# Patient Record
Sex: Female | Born: 1935
Health system: Western US, Academic
[De-identification: ages and names within clinical notes are randomized; demographics above are authoritative.]

---

## 2018-01-12 DIAGNOSIS — E876 Hypokalemia: Secondary | ICD-10-CM

## 2018-01-12 DIAGNOSIS — I129 Hypertensive chronic kidney disease with stage 1 through stage 4 chronic kidney disease, or unspecified chronic kidney disease: Secondary | ICD-10-CM

## 2018-12-15 ENCOUNTER — Telehealth: Payer: PRIVATE HEALTH INSURANCE

## 2018-12-15 NOTE — Telephone Encounter
Appointment Accommodation Request    MD Name: Hart Carwin    Appointment Type: New     Reason for sooner request: Pt usually has monthly follow ups and would like to establish care with Dr. Hart Carwin.     Pt was referred by Dr. Evelena Peat     Date/Time Requested (If any): As soon as possible.     Last seen by MD: New Pt.     Any Symptoms:  []  Yes  [x]  No      ? If yes, what symptoms are you experiencing:   o Duration of symptoms (how long):     Patient was offered an appointment but declined.    Patient was advised to seek emergency services if conditions are urgent or emergent.    Patient has been notified of the 24-48 hour turnaround time.

## 2018-12-28 ENCOUNTER — Ambulatory Visit: Payer: PRIVATE HEALTH INSURANCE

## 2018-12-28 DIAGNOSIS — D631 Anemia in chronic kidney disease: Secondary | ICD-10-CM

## 2018-12-28 DIAGNOSIS — I129 Hypertensive chronic kidney disease with stage 1 through stage 4 chronic kidney disease, or unspecified chronic kidney disease: Secondary | ICD-10-CM

## 2018-12-28 DIAGNOSIS — N184 Chronic kidney disease, stage 4 (severe): Secondary | ICD-10-CM

## 2018-12-28 DIAGNOSIS — Z862 Personal history of diseases of the blood and blood-forming organs and certain disorders involving the immune mechanism: Secondary | ICD-10-CM

## 2018-12-28 DIAGNOSIS — N189 Chronic kidney disease, unspecified: Secondary | ICD-10-CM

## 2018-12-28 MED ORDER — DARBEPOETIN ALFA 100 MCG/ML IJ SOLN
100 ug | SUBCUTANEOUS | 4 refills | 30.00000 days | Status: AC
Start: 2018-12-28 — End: ?

## 2018-12-28 MED ORDER — DARBEPOETIN ALFA 100 MCG/ML IJ SOLN
100 ug | SUBCUTANEOUS | 4 refills | 30.00000 days | Status: AC
Start: 2018-12-28 — End: 2018-12-29

## 2018-12-28 NOTE — Progress Notes
OUTPATIENT RENAL CONSULTATION    Referring Physician:  Zackrey Dyar,*     Reason for Consultation:  Management of CKD    History of Present Illness:  84 y.o. African American female with CKD, HTN, MDS and hypothyroidism. She is here to establish care with nephrology, accompanied by her daughter.    She lives in [place] but has just moved to [facility] to stay with her daughter for the next 6 months. She follows with her nephrologist,  hematologist and her PCP in [place].  She has history of long-standing hypertension.   Kidney disease was diagnosed about 4 years ago and was told it ''rapidly progressed''.  Per patient, about 4 years ago she underwent an invasive vascular study, which sounds like renal artery study and was told there was only some calcification and that the kidneys were not the cause of high blood pressure.  She also has history of MDS - diagnosed by bone marrow biopsy. She receives monthly Aranesp in clinic but she does not know the dose.   Renal US in June 2020 from outside record reviewed. Noted small stones <39mm in the right kidneys w/o hydronephrosis. Patient denies passing any stone or history of urologic intervention. No symptoms. She had one episode of gross hematuria in Sep 2020 and was told there was no infection. It resolved spontaneouslu. Did not have cold symptoms at that time. No pain. No dysuria. No feeling of incomplete bladder emptying. No foamy urine.  No diabetes.    Home BP: average 130-140s/60s. No orthostatic symptoms.  On amlodipine 5 mg daily, clonidine 0.1 mg TID and Toprol XL 100 mg daily  Off lisinopril due to cough    Last Aresnesp dose on 11/17/18. Hb 10.3 in 11/12/18 per outside record.    Fair appetite. Feels fine. No N/V. No chest pain or dyspnea. No leg swelling.    No history of CAD.     Family history:  Sisters have kidney disease not on dialysis.    Review of Systems: Besides what was mentioned above a 14-pt review of systems was obtained and found to be negative or normal.    No past medical history on file.      No past surgical history on file.    Allergies   Allergen Reactions   ? Penicillins Hives   ? Sulfa Antibiotics Agranulocytosis   ? Naproxen Rash     Aleve       Medications that the patient states to be currently taking   Medication Sig   ? amLODIPine 5 mg tablet    ? aspirin 81 mg EC tablet Take 81 mg by mouth daily.   ? cloNIDine 0.1 mg tablet Take 0.1 mg by mouth.   ? esomeprazole 40 mg capsule    ? gabapentin 100 mg capsule Take 100 mg by mouth.   ? metoprolol succinate 100 mg 24 hr tablet    ? mirtazapine 15 mg tablet TAKE ONE TABLET BY MOUTH EVERY NIGHT   ? POTASSIUM CHLORIDE PO    ? rosuvastatin 10 mg tablet Take 10 mg by mouth.   ? SYNTHROID 50 mcg tablet    ? SYNTHROID 75 mcg tablet        Social History     Socioeconomic History   ? Marital status: Unknown     Spouse name: Not on file   ? Number of children: Not on file   ? Years of education: Not on file   ? Highest education level: Not  on file   Occupational History   ? Not on file   Social Needs   ? Financial resource strain: Not on file   ? Food insecurity     Worry: Not on file     Inability: Not on file   ? Transportation needs     Medical: Not on file     Non-medical: Not on file   Tobacco Use   ? Smoking status: Former Smoker   ? Smokeless tobacco: Never Used   Substance and Sexual Activity   ? Alcohol use: Not Currently   ? Drug use: Not on file   ? Sexual activity: Not on file   Lifestyle   ? Physical activity     Days per week: Not on file     Minutes per session: Not on file   ? Stress: Not on file   Relationships   ? Social Wellsite geologist on phone: Not on file     Gets together: Not on file     Attends religious service: Not on file     Active member of club or organization: Not on file     Attends meetings of clubs or organizations: Not on file     Relationship status: Not on file Other Topics Concern   ? Not on file   Social History Narrative   ? Not on file       No family history on file.    Physical Examination:  BP 165/60 (BP Location: Right arm, Patient Position: Standing, Cuff Size: Regular)  ~ Pulse 84  ~ Temp 35.8 ?C (96.4 ?F) (Forehead)  ~ Resp 16  ~ Ht 1.549 m (5' 1'')  ~ Wt 52.3 kg (115 lb 6.4 oz)  ~ SpO2 93% Comment: RA- Finger Probe ~ BMI 21.80 kg/m?   General: NAD   Head: Atraumatic, normocephalic   Eyes: EOMI, no conjunctival pallor, no scleral icterus   ENMT: clear oropharynx, moist oral mucosa, fair dentition   Neck: supple, no JVD, midline trachea   Cardiovascular: RRR, normal S1/S2, SEM grade 4 at LUSB  Respiratory: clear to auscultation, good air entry, no accessory muscle use   Abdomen: +BS soft, nontender, nondistended  Extremities: trace lower extremity swelling. Warm.   Skin: No rashes. Nl turgor    Labs:    No results found for: WBC, HGB, HCT, MCV, PLT    No results found for: FE, TIBC, FEBINDSAT, FERRITIN    No results found for: NA, K, CL, CO2, BUN, CREAT, GLUCOSE    No results found for: CREAT    No results found for: CALCIUM, ICALCOR, PHOS, MG, ALBUMIN    No results found for: VITD25OH, PTHINT    No results found for: CHOL, CHOLHDL, TRIGLY, NOHDLCHOCAL    No results found for: PHVEN, PCO2VEN, PO2VEN, BICARBVEN, BEVEN    No results found for: PHART, PCO2ART, PO2ART, BICARBART, BEART, O2SATART, FIO2ART    No results found for: SPECGRAVUR, PHUR, BLDUR, BILIUR, KETONESUR, GLUCOSEUR, PROTCLUR, NITRITEUR, LEUKESTUR, RBCSUR, WBCSUR    No results found for: TPCREAT    No results found for: Gunnar Fusi, CLRANUR, CREATRANUR      Outside record lab:  11/12/18   CBC WBC 45.5, Hb 10.3, Plt 177  TG 157, TC 112, HDL 26, cLDL 55  BUN 45, Cr 1.8. eGFR 33  Ca 9.7, Phos 4.8, Alb 4.3  Na 142, K 3.5, Cl 104, tCO2 25    Cr 2.1 on 09/02/18  2  on 07/01/18      Imaging:  US kidneys 07/2018 from outside record Rt kidney 9.1 cm - multiple small non-obstructing stones visualized, largest 0.42 cm  Lt kidney 8.3 cm    Impression:  84 y.o.female with long-standing HTN, MDS, hypothyroidism here to establish care    Assessment:  #CKD: Stage G3b/4/A?  - Secondary to hypertensive nephrosclerosis +/- secondary FSGS  - Baseline creatinine 1.8-2  - Small kidneys on renal US. Per patient, RAS was ruled out    # Proteinuria  - Proteinuria: Goal UPC < [id number] mg/day  - Repeat UA and UPCR, UACR  - Not on ACEi/ARB (was on lisinopril but off due to cough)    #Hypertension- at goal BP < 130/80.  - amlodipine 5 mg daily  - clonidine 0.1 mg TID  - Toprol XL 100 mg daily  - low salt diet    #Volume Status - euvoemic    #CKD MBD  - check Ca, Phos, PTH, VitD  -Vitamin D: Goal >30    #Acid-Base -  Goal is to maintain total CO2 (HCO3-) ? 22 assuming no respiratory alkalosis   - at goal. Not on alkaline therapy    #Anemia -  - repeat CBC and iron profile  - patient is already on Aranesp for both anemia due to CKD and MDS - previously prescribed by her hematologist in [place]  - prescribe Aranesp 100 mcg subcutaneous monthly for now. Asked patient to check the dose with her hematologist    #Hyperlipidemia - CKD is an independent risk factor for CHD.    - on rosuvastatin 10 mg daily    #Immunizations -    Influenza: Oct 2020   Pneumovax: ?   Prevnar 13: ?   Hepatitis B: not sure    There is no immunization history on file for this patient.     # Transplant status - unlikely to be a candidate given advanced age    Plan:  - repeat complete CKD lab   - Aranesp 100 mcg subcutaneous monthly     General CKD Recommendations:  -Recommend 0.8g/kg of protein intake for estimated gfr <60. If on dialysis, recommend  1.2 gm/kg/day in CKD patients on dialysis.  -Avoid nephrotoxins including NSAIDs and chinese herbs  -renally dose all medications to estimated GFR  -If estimated GFR <30, avoid gadolinium exposure with MRI. -avoid iodinated contrast, if possible.  If contrast is needed, please contact us for recommendations.    Return to clinic: 2 mo with labs prior    Kupono Marling  Nephrology Fellow

## 2018-12-29 ENCOUNTER — Telehealth: Payer: MEDICARE

## 2018-12-29 NOTE — Telephone Encounter
PLEASE SUBMIT [place]

## 2018-12-29 NOTE — Telephone Encounter
Call Back Request    MD:  Dr. Hewitt Shorts    Reason for call back: Pt's daughter called and advised that she would like a call back about medication. She stated that she tried to get Aranesp through [facility] but we don't carry it and had it sent to an outside pharmacy. Pt stated that she spoke with pharmacy and they advised that they would submit a PA for the medication. Pt wants to follow up and see if the PA was received or how to obtain the medication. cBN [phone]    Any Symptoms:  []  Yes  [x]  No      ? If yes, what symptoms are you experiencing:    o Duration of symptoms (how long):    o Have you taken medication for symptoms (OTC or Rx):      Patient or caller has been notified of the 24-48 hour turnaround time.

## 2018-12-30 NOTE — Telephone Encounter
WBC = 61.6    Lab will fax results

## 2018-12-30 NOTE — Telephone Encounter
PDL Call to Practice    Reason for Call: Karlene Lineman from [facility] requesting to speak with office regarding Critical Lab results.     MD: [email or web address]    Appointment Related?  []  Yes  [x]  No     If yes;  Date:  Time:    Call warm transferred to PDL: [x]  Yes  []  No    Call Received by Practice Representative: Rica Mote

## 2018-12-30 NOTE — Telephone Encounter
Reply by:    Ok. Known CML/MDS. Stable - at baseline.  Thanks

## 2019-01-05 ENCOUNTER — Telehealth: Payer: MEDICARE

## 2019-01-05 ENCOUNTER — Ambulatory Visit: Payer: MEDICARE

## 2019-01-05 NOTE — Telephone Encounter
Thats ok. She can come in for EPO every 2 weeks until Aranesp is approved.

## 2019-01-05 NOTE — Progress Notes
Renal Attending    I saw and evaluated the patient and I discussed the management with the housestaff. I then reviewed the housestaff's notes and I agreed with findings and plan of care that we formulated.

## 2019-01-05 NOTE — Telephone Encounter
Aranesp pending approval.    Please advise on request below for in office EPO inj.

## 2019-01-05 NOTE — Addendum Note
Addended by: Greggory Keen on: 01/05/2019 03:23 PM     Modules accepted: Orders

## 2019-01-05 NOTE — Telephone Encounter
Aranesp (Albumin Free) 100MCG/ML solution APPROVED   PA Case ID: [id number]  VALID: 01/05/2019 - 03/30/2019

## 2019-01-05 NOTE — Telephone Encounter
Call Back Request    MD:  Emilia Beck     Reason for call back: Pt's daughter called with regards to pt's injection for Aranesp. She advised that pt has not been able to receive the injection due to the PA still pending. She is requesting for pt to be able to come in for an injection of Epogen to prevent from having her white blood cell count get too low. She is requesting for this to be an appointment with a Nurse.   Please assist.    CB:[phone]    Any Symptoms:  []  Yes  [x]  No      ? If yes, what symptoms are you experiencing:    o Duration of symptoms (how long):    o Have you taken medication for symptoms (OTC or Rx):      Patient or caller has been notified of the 24-48 hour turnaround time.

## 2019-01-06 NOTE — Progress Notes
Renal Attending:    The fellow will follow-up with any additional orders as needed by the patient.

## 2019-01-06 NOTE — Telephone Encounter
Renal Attending:    As stated by the fellow, she can come in for EPO every 2 weeks until Aranesp is approved.

## 2019-01-07 ENCOUNTER — Institutional Professional Consult (permissible substitution): Payer: MEDICARE

## 2019-01-07 DIAGNOSIS — Z862 Personal history of diseases of the blood and blood-forming organs and certain disorders involving the immune mechanism: Secondary | ICD-10-CM

## 2019-01-07 DIAGNOSIS — N189 Chronic kidney disease, unspecified: Secondary | ICD-10-CM

## 2019-01-14 ENCOUNTER — Ambulatory Visit: Payer: PRIVATE HEALTH INSURANCE

## 2019-01-14 ENCOUNTER — Telehealth: Payer: PRIVATE HEALTH INSURANCE

## 2019-01-14 ENCOUNTER — Ambulatory Visit: Payer: PRIVATE HEALTH INSURANCE | Attending: Emergency Medical Services

## 2019-01-14 DIAGNOSIS — I10 Essential (primary) hypertension: Secondary | ICD-10-CM

## 2019-01-14 DIAGNOSIS — R0600 Dyspnea, unspecified: Secondary | ICD-10-CM

## 2019-01-14 DIAGNOSIS — R0902 Hypoxemia: Secondary | ICD-10-CM

## 2019-01-14 DIAGNOSIS — N189 Chronic kidney disease, unspecified: Secondary | ICD-10-CM

## 2019-01-14 DIAGNOSIS — R05 Cough: Secondary | ICD-10-CM

## 2019-01-14 DIAGNOSIS — N1832 Stage 3b chronic kidney disease: Secondary | ICD-10-CM

## 2019-01-14 DIAGNOSIS — C9210 Chronic myeloid leukemia, BCR/ABL-positive, not having achieved remission: Secondary | ICD-10-CM

## 2019-01-14 DIAGNOSIS — D469 Myelodysplastic syndrome, unspecified: Secondary | ICD-10-CM

## 2019-01-14 DIAGNOSIS — J40 Bronchitis, not specified as acute or chronic: Secondary | ICD-10-CM

## 2019-01-14 NOTE — Telephone Encounter
Call Back Request    MD:  Dr. Hewitt Shorts    Reason for call back:  The patient's daughter called to get advice regarding her mother's current condition.  She stated that her mother has been coughing last night and was unable to get rest as a result.  She feels it's getting worse and causing other issues.     The daughter scheduled an appointment for 12/14, Monday however she feels it needs to be addressed sooner.    She unsure if they need to go to urgent care.  She was advised that seek urgent care or ED as well.    Please call back to assist.  Thank you.    Any Symptoms:  []  Yes  [x]  No      ? If yes, what symptoms are you experiencing:    o Duration of symptoms (how long):    o Have you taken medication for symptoms (OTC or Rx):      Patient or caller has been notified of the 24-48 hour turnaround time.

## 2019-01-14 NOTE — Progress Notes
The patient's daughter reports that patient has been coughing more and gets more short of breath when she walks for the past 5 days.  Oropharyngeal SARS-CoV2 was negative on 12/4 per her report.  Advised to go to urgent care or emergency room since she has chronic leukemia and she is at higher risk for infection.

## 2019-01-14 NOTE — Progress Notes
Chief complaint:  I've had this bad cough and trouble breathing  Lindsay Brennan is a 84 y.o. female  History CKD, HTN, myelodysplastic syndrome presents with two weeks of a dry cough that has been worsening over the past several days.  Last night was severe, she was unable to sleep.  She relates feeling SOB, particularly with exertion which is something she has never experienced.  Patient had negative Covid test 6 days ago, was a buccal swab.  She has had no chest pain, fevers/chills, myalgias, headache, rash, vomiting, diarrhea, anosmia or loss of taste.  She has had no known contact with Covid positive person.     No past medical history on file.  As above  Hypothyroidism  Current Outpatient Medications   Medication Sig   ? amLODIPine 5 mg tablet    ? aspirin 81 mg EC tablet Take 81 mg by mouth daily.   ? cloNIDine 0.1 mg tablet Take 0.1 mg by mouth.   ? darbepoetin alfa 100 mcg/mL SOLN injection Inject 1 mL (100 mcg total) under the skin every thirty (30) days.   ? esomeprazole 40 mg capsule    ? gabapentin 100 mg capsule Take 100 mg by mouth.   ? metoprolol succinate 100 mg 24 hr tablet    ? mirtazapine 15 mg tablet TAKE ONE TABLET BY MOUTH EVERY NIGHT   ? POTASSIUM CHLORIDE PO    ? rosuvastatin 10 mg tablet Take 10 mg by mouth.   ? SYNTHROID 50 mcg tablet    ? SYNTHROID 75 mcg tablet      No current facility-administered medications for this visit.      SHx:  Non smoker;  Resides in [place] and is staying with her daughter here in [place] until the spring.   Blood pressure 146/56, pulse 85, temperature 37.1 ?C (98.7 ?F), temperature source Oral, resp. rate 16, SpO2 94 %.    Gen:  Appears well, comfortable, NAD.  Respirations unlabored  HEENT:  Conjunctiva pink and normal.   Neck:  No TM or LA  Chest:  BS equal with good air movement throughout.  Bilateral basilar crackles; no wheeze  Cor:  Reg rate;  Normal S1 and S2; pronounced holosystolic murmur upper sternum, more pronounced on the rigth Abd:  No distension;   Soft, non-tender;   Ext:  Warm and dry without edema;    Neuro:  Alert and appropriate;   Psych:  Normal affect, mental status.     Pulse ox in this center 92-94% RA  Results for orders placed or performed in visit on 12/28/18   Basic Metabolic Panel   Result Value Ref Range    Glucose, Serum ([facility]) 86 65 - 99 mg/dL    BUN ([facility]) 42 (H) 8 - 27 mg/dL    Creatinine, Serum ([facility]) 1.98 (H) 0.57 - 1.00 mg/dL    eGFR If NonAfricn Am ([facility]) 23 (L) >59 mL/min/1.73    eGFR If Africn Am ([facility]) 27 (L) >59 mL/min/1.73    BUN/Creatinine Ratio ([facility]) 21 12 - 28    Sodium, Serum ([facility]) 141 134 - 144 mmol/L    Potassium, Serum ([facility]) 4.2 3.5 - 5.2 mmol/L    Chloride, Serum ([facility]) 106 96 - 106 mmol/L    Carbon Dioxide, Total ([facility]) 19 (L) 20 - 29 mmol/L    Calcium, Serum ([facility]) 9.5 8.7 - 10.3 mg/dL   PTH,Intact & Calcium   Result Value Ref Range    Calcium, Serum ([facility]) 9.4 mg/dL  Comment: Reference Range:  >=60y: 8.6 - 10.2      PTH (Intact Assay), Serum ([facility]) 74 (H) pg/mL     Comment: Reference Range:  Children and Adults: 10 - 65     Phosphorus   Result Value Ref Range    Phosphorus, Serum ([facility]) 4.3 3.0 - 4.3 mg/dL   CBC & Auto Differential   Result Value Ref Range    WBC ([facility]) 61.6 (>) 3.4 - 10.8 x10E3/uL    RBC ([facility]) 3.27 (L) 3.77 - 5.28 x10E6/uL     Comment: Few tear drops.  Polychromasia present  Ovalocytes present.      Hemoglobin ([facility]) 8.5 (L) 11.1 - 15.9 g/dL    Hematocrit ([facility]) 27.9 (L) 34.0 - 46.6 %    MCV ([facility]) 85 79 - 97 fL    MCH ([facility]) 26.0 (L) 26.6 - 33.0 pg    MCHC ([facility]) 30.5 (L) 31.5 - 35.7 g/dL    RDW ([facility]) 16.1 (H) 11.7 - 15.4 %    Platelets ([facility]) 266 150 - 450 x10E3/uL    Neutrophils ([facility]) 48 Not Estab. %    Lymphs ([facility]) 10 Not Estab. %    Monocytes ([facility]) 15 Not Estab. %    Eos ([facility]) 0 Not Estab. %    Basos ([facility]) 5 Not Estab. %    Immature Cells ([facility]) Note Neutrophils (Absolute) ([facility]) 32.6 (H) 1.4 - 7.0 x10E3/uL    Lymphs (Absolute) ([facility]) 6.2 (H) 0.7 - 3.1 x10E3/uL    Monocytes(Absolute) ([facility]) 9.2 (H) 0.1 - 0.9 x10E3/uL    Eos (Absolute) ([facility]) 0.0 0.0 - 0.4 x10E3/uL    Baso (Absolute) ([facility]) 3.1 (H) 0.0 - 0.2 x10E3/uL    NRBC ([facility]) 2 (H) 0 - 0 %    Hematology Comments: ([facility]) Note:      Comment: Manual differential was performed.   Ferritin   Result Value Ref Range    Ferritin, Serum ([facility]) 562 (H) 15 - 150 ng/mL   Iron & Iron Binding Capacity   Result Value Ref Range    Iron Bind.Cap.(TIBC) ([facility]) 208 (L) 250 - 450 ug/dL    UIBC ([facility]) 29 (L) 118 - 369 ug/dL    Iron, Serum ([facility]) 179 (H) 27 - 139 ug/dL    Iron Saturation ([facility]) 86 (HH) 15 - 55 %   Total Protein/Creat Ratio    Specimen: Clean Catch, Midstream; Urine   Result Value Ref Range    Creatinine, Urine ([facility]) 75.5 Not Estab. mg/dL    Protein,Total,Urine ([facility]) 105.7 Not Estab. mg/dL    Protein/Creat Ratio ([facility]) 1,400 (H) 0 - 200 mg/g creat   Albumin/Creat Ratio Ur    Specimen: Clean Catch, Midstream; Urine   Result Value Ref Range    Microalbumin, Urine ([facility]) 236.9 Not Estab. ug/mL    Alb/Creat Ratio ([facility]) 314 (H) 0 - 29 mg/g creat     Comment:                        Normal:                0 -  29                         Moderately increased: 30 - 300                         Severely increased:       >  300     Albumin   Result Value Ref Range    Albumin, Serum ([facility]) 4.3 3.6 - 4.6 g/dL   Magnesium   Result Value Ref Range    Magnesium, Serum ([facility]) 1.6 1.6 - 2.3 mg/dL   Urinalysis,Routine    Specimen: Clean Catch, Midstream; Urine   Result Value Ref Range    Specific Gravity ([facility]) 1.015 1.005 - 1.030    pH ([facility]) 5.5 5.0 - 7.5    Urine-Color ([facility]) Yellow Yellow    Appearance ([facility]) Cloudy (A) Clear    WBC Esterase ([facility]) Negative Negative    Protein ([facility]) 2+ (A) Negative/Trace    Glucose ([facility]) Negative Negative Ketones ([facility]) Negative Negative    Occult Blood ([facility]) 3+ (A) Negative    Bilirubin ([facility]) Negative Negative    Urobilinogen,Semi-Qn ([facility]) 0.2 0.2 - 1.0 mg/dL    Nitrite, Urine ([facility]) Negative Negative    Microscopic Examination ([facility]) See below:    [facility] REFLEX ORDER (MICROSCOPIC EXAMINATION)   Result Value Ref Range    WBC ([facility]) 6-10 (A) 0 - 5 /hpf    RBC ([facility]) >30 (A) 0 - 2 /hpf    Epithelial Cells (non renal) ([facility]) 0-10 0 - 10 /hpf    Crystals ([facility]) Present (A) N/A    Crystal Type ([facility]) Uric Acid N/A    Bacteria ([facility]) Few None seen/Few   Immature Cells   Result Value Ref Range    Bands ([facility]) 5 Not Estab. %    Metamyelocytes ([facility]) 5 (H) 0 - 0 %    Myelocytes ([facility]) 8 (H) 0 - 0 %    Promyelocytes ([facility]) 1 (H) 0 - 0 %    Blasts/blast like cells ([facility]) 3 (H) 0 - 0 %     Considering age, multiple concerning co-morbid conditions particularly anemia, on Darbepoetin, immunodeficiency, on  dyspnea, relative hypoxia patient requires a more extensive evaluation and treatment not possible here in urgent care.  She and daughter were advised to immediately go to the ER  Diagnoses and all orders for this visit:    Bronchitis    Dyspnea, unspecified type    Hypoxia    Myelodysplastic syndrome (HCC/RAF)    Chronic kidney disease, unspecified CKD stage    Hypertension, essential

## 2019-01-14 NOTE — Telephone Encounter
Please see call back request

## 2019-01-15 ENCOUNTER — Inpatient Hospital Stay
Admit: 2019-01-15 | Discharge: 2019-01-15 | Disposition: A | Discharge status: Home / Self Care | Payer: MEDICARE | Source: Home / Self Care | Attending: Emergency Medical Services

## 2019-01-15 LAB — COVID-19, Influenza A/B, RSV PCR: COVID-19 PCR: NOT DETECTED

## 2019-01-15 NOTE — ED Provider Notes
Lindsay Brennan  Emergency Department Service Report    Lindsay Brennan, a 84 y.o. female, presents with ILI/PUI (Non productive cough + Acute SOB with exertion x 1 week; Hx of myelodysplastic syndrome and CKD)      Triage   Arrived at 4:51 PM  Arrived by Walk-in [14]    ED Triage Vitals   Temp Temp Source BP Heart Rate Resp SpO2 O2 Device Pain Score Weight   01/14/19 1703 01/14/19 1703 01/14/19 1703 01/14/19 1703 01/14/19 1703 01/14/19 1703 01/14/19 1703 01/14/19 1712 01/14/19 1703   37.1 ?C (98.7 ?F) Oral 168/62 86 16 96 % None (Room air) Zero 52.2 kg (115 lb)       Chief Complaint   Patient presents with   ? ILI/PUI     Non productive cough + Acute SOB with exertion x 1 week; Hx of myelodysplastic syndrome and CKD        Comprehensive Exam Initiated  Contact Date: 01/14/19  Contact Time: 2019    History   HPI    Lindsay Brennan is a 84 y.o. female with history of CKD, hypertension, and myelodysplastic syndrome, presenting with 10 days of dry cough. Patient states she tested negative for COVID several days ago. She has some shortness of breath only when walking up a hill or stairs but otherwise no resting shortness of breath. She denies any chest pain. No fevers. No ace inhibitor use. She has reflux sometimes but non recently. No history of pulmonary embolism or deep vein thrombosis. No leg swelling. No recent period of immobilization. No history of asthma. She has never smoked or used drugs. No orthopnea.   Past Medical History:   Diagnosis Date   ? CKD (chronic kidney disease)    ? Hyperlipidemia    ? Hypertension    ? Leukemia (HCC/RAF)    ? Myelodysplasia (myelodysplastic syndrome) (HCC/RAF)         No past surgical history on file.     Past Family History   family history is not on file.     Past Social History   she reports that she has quit smoking. She has never used smokeless tobacco. She reports previous alcohol use. No history on file for drug and sexual activity. Review of Systems   Constitutional: Negative for fever.   Respiratory: Positive for cough. Negative for shortness of breath.    Cardiovascular: Negative for chest pain and leg swelling.   All other systems reviewed and are negative.      Physical Exam   Physical Exam   Constitutional: She is oriented to person, place, and time. She appears well-developed and well-nourished.   HENT:   Head: Normocephalic and atraumatic.   Eyes: Conjunctivae are normal.   Neck: Normal range of motion. Neck supple.   Cardiovascular: Normal rate, regular rhythm and normal heart sounds.   Pulmonary/Chest: Effort normal and breath sounds normal. No respiratory distress. She has no wheezes.   Lungs clear to auscultation bilaterally    Abdominal: Soft. She exhibits no distension. There is no abdominal tenderness.   Musculoskeletal: Normal range of motion.         General: No edema.      Comments: No lower extremity edema    Neurological: She is alert and oriented to person, place, and time.   Skin: Skin is warm and dry.   Psychiatric: She has a normal mood and affect.   Nursing note and vitals reviewed.  Medical Decision Making   Lindsay Brennan is a 84 y.o. female with history of CKD, hypertension, and myelodysplastic syndrome, presenting with 10 days of dry cough. With cough, consider viral upper respiratory infection including COVID though patient with recent negative COVID test. Will evaluate for bacterial pneumonia though no fever and patient is well appearing. History and exam less consistent with heart failure, not appearing hypervolemic; also doubt PE. Doubt ACS with no chest pain. Plan for chest xray.       ED Course     Laboratory Results     Labs Reviewed   COVID-19-INFLUENZA A/B-RSV PCR, RESPIRATORY UPPER    Narrative: This test is intended for in vitro diagnostic use under FDA Emergency Use Authorization only. The [facility] is certified under the Clinical Laboratory Improvement Amendments of 1988 (CLIA-88) as qualified to perform high complexity clinical laboratory testing.          Imaging Results     XR chest ap (1 view)   Final Result by Marigene Ehlers., MD (12/10 2123)   IMPRESSION:   No acute cardiopulmonary process.  No consolidations.      Signed by: Vanessa Barbara   01/14/2019 9:23 PM          Consults     Progress Notes / Reassessments     ED Course as of Jan 13 2229   Thu Jan 14, 2019   2229 Chest XR unremarkable, discussed results, given return precautions, told to follow up with primary care doctor in one week for further evaluation of cough    [DH]      ED Course User Index  [DH] Margaretmary Dys., MD         ED Procedure Notes     Any ED procedures performed are documented on separate ED procedure notes.    Clinical Impression     1. Cough        Disposition and Follow-up   Disposition: Discharge [1]       Future Appointments   Date Time Provider Department Center   01/18/2019  9:20 AM Sheron Nightingale, MD [facility]   03/01/2019 11:20 AM Boonpheng, Boonph[place], MD [facility]       Follow up with:  No follow-up provider specified.    Return precautions are specified on After Visit Summary.    Discharge Medication List as of 01/14/2019 10:15 PM          Orders Placed This Encounter   ? COVID-19, Influenza, RSV - PCR, Nasopharyngeal   ? XR chest ap (1 view)   ? Isolation Status: Add: Enhanced Droplet and Contact; Remove: N/A       Scribe Signature   I, Marcelina Morel, have acted as a Stage manager for [employer] on behalf of Dr. Mont Dutton on 01/14/2019 at 8:56 PM.    Physician Signature I have reviewed this note as recorded by SM, who acted as medical scribe, and I attest that it is an accurate representation of my H&P and other events of the ED visit except as otherwise noted.             Margaretmary Dys., MD  Resident  01/14/19 [phone]

## 2019-01-17 NOTE — Telephone Encounter
Renal Attending:    Patient Callback: The patient's daughter called to get advice regarding her mother's current condition.  She stated that her mother has been coughing. Plan as per Renal Fellow.

## 2019-01-18 ENCOUNTER — Ambulatory Visit: Payer: PRIVATE HEALTH INSURANCE

## 2019-01-18 DIAGNOSIS — E872 Acidosis: Secondary | ICD-10-CM

## 2019-01-18 DIAGNOSIS — N2581 Secondary hyperparathyroidism of renal origin: Secondary | ICD-10-CM

## 2019-01-18 DIAGNOSIS — N184 Chronic kidney disease, stage 4 (severe): Secondary | ICD-10-CM

## 2019-01-18 DIAGNOSIS — N200 Calculus of kidney: Secondary | ICD-10-CM

## 2019-01-18 DIAGNOSIS — R801 Persistent proteinuria, unspecified: Secondary | ICD-10-CM

## 2019-01-18 MED ORDER — OLMESARTAN MEDOXOMIL 20 MG PO TABS
20 mg | ORAL_TABLET | Freq: Every day | ORAL | 1 refills | Status: AC
Start: 2019-01-18 — End: ?

## 2019-01-18 MED ORDER — CITRIC ACID-SODIUM CITRATE 334-500 MG/5ML (NON-KETOGENIC) PO SOLN
15 mL | Freq: Two times a day (BID) | ORAL | 2 refills | 30.00000 days | Status: AC
Start: 2019-01-18 — End: ?

## 2019-01-18 NOTE — Patient Instructions
Check blood pressure at home (goal is to keep blood pressure less than 130/80).  Start losartan 25 mg once daily for blood pressure and protein in the urine.   Start BiCitra 30 mL once daily to neutralize acid in the body and prevent kidney stones.

## 2019-01-18 NOTE — Progress Notes
OUTPATIENT RENAL CONSULTATION    Referring Physician:  No ref. provider found     Reason for Consultation:  Management of CKD    History of Present Illness:  84 y.o. African American female with CKD, HTN, MDS and hypothyroidism. She is here to establish care with nephrology, accompanied by her daughter.    She lives in [place] but has just moved to [facility] to stay with her daughter for the next 6 months. She follows with her nephrologist,  hematologist and her PCP in [place].  She has history of long-standing hypertension.     Kidney disease was diagnosed about 4 years ago and was told it ''rapidly progressed''.  Per patient, about 4 years ago she underwent an invasive vascular study, which sounds like renal artery study and was told there was only some calcification and that the kidneys were not the cause of high blood pressure.  She also has history of MDS - diagnosed by bone marrow biopsy. She receives monthly Aranesp in clinic but she does not know the dose.   Renal US in June 2020 from outside record reviewed. Noted small stones <60mm in the right kidneys w/o hydronephrosis. Patient denies passing any stone or history of urologic intervention. No symptoms. She had one episode of gross hematuria in Sep 2020 and was told there was no infection. It resolved spontaneouslu. Did not have cold symptoms at that time. No pain. No dysuria. No feeling of incomplete bladder emptying. No foamy urine.  No diabetes.     Interval events  Saturday night she had LLQ abdominal pain, diarrhea, nausea and vomiting which resolved.  Last night she noticed gross hematuria twice. No dysuria, frequency, urgency. No flank pain. Has not noticed any stone.  She went to ER on 12/10 for cough, dyspnea. SARS-CoV2 neg. CXR WNL. Feels better now.    Home BP: have not checked recently  On amlodipine 5 mg daily, clonidine 0.1 mg TID and Toprol XL 100 mg daily Off lisinopril due to cough. Was on losartan in the past but it was discontinued when the FDA recalled it.    Diet: low salt, low cholesterol    Got EPO 01/07/19 inb clinic. Will get Aranesp with another outside nephrologist office but still would like to follow with [facility] nephrology too.    No leg swelling. Mild dyspnea on exertion.  No h/o gout.    Family history:  Sisters have kidney disease not on dialysis.    Review of Systems:  Besides what was mentioned above a 14-pt review of systems was obtained and found to be negative or normal.    Past Medical History:   Diagnosis Date   ? CKD (chronic kidney disease)    ? Hyperlipidemia    ? Hypertension    ? Leukemia (HCC/RAF)    ? Myelodysplasia (myelodysplastic syndrome) (HCC/RAF)          No past surgical history on file.    Allergies   Allergen Reactions   ? Penicillins Hives   ? Sulfa Antibiotics Agranulocytosis   ? Naproxen Rash     Aleve       Medications that the patient states to be currently taking   Medication Sig   ? amLODIPine 5 mg tablet    ? aspirin 81 mg EC tablet Take 81 mg by mouth daily.   ? benzonatate 100 mg capsule Take 100 mg by mouth three (3) times daily as needed for Cough.   ? cloNIDine 0.1 mg tablet  Take 0.1 mg by mouth.   ? darbepoetin alfa 100 mcg/mL SOLN injection Inject 1 mL (100 mcg total) under the skin every thirty (30) days.   ? esomeprazole 40 mg capsule    ? gabapentin 100 mg capsule Take 100 mg by mouth.   ? metoprolol succinate 100 mg 24 hr tablet    ? mirtazapine 15 mg tablet TAKE ONE TABLET BY MOUTH EVERY NIGHT   ? POTASSIUM CHLORIDE PO    ? rosuvastatin 10 mg tablet Take 10 mg by mouth.   ? SYNTHROID 50 mcg tablet    ? SYNTHROID 75 mcg tablet        Social History     Socioeconomic History   ? Marital status: Widowed     Spouse name: Not on file   ? Number of children: Not on file   ? Years of education: Not on file   ? Highest education level: Not on file   Occupational History   ? Not on file   Social Needs ? Financial resource strain: Not on file   ? Food insecurity     Worry: Not on file     Inability: Not on file   ? Transportation needs     Medical: Not on file     Non-medical: Not on file   Tobacco Use   ? Smoking status: Former Smoker   ? Smokeless tobacco: Never Used   Substance and Sexual Activity   ? Alcohol use: Not Currently   ? Drug use: Not on file   ? Sexual activity: Not on file   Lifestyle   ? Physical activity     Days per week: Not on file     Minutes per session: Not on file   ? Stress: Not on file   Relationships   ? Social Wellsite geologist on phone: Not on file     Gets together: Not on file     Attends religious service: Not on file     Active member of club or organization: Not on file     Attends meetings of clubs or organizations: Not on file     Relationship status: Not on file   Other Topics Concern   ? Not on file   Social History Narrative   ? Not on file       No family history on file.    Physical Examination:  BP 163/65 Comment: left arm standing ~ Pulse 82  ~ Temp 36.8 ?C (98.2 ?F) (Forehead)  ~ Resp 16  ~ Ht 5' 1'' (1.549 m)  ~ Wt 114 lb (51.7 kg)  ~ SpO2 96%  ~ BMI 21.54 kg/m?   General: NAD   Head: Atraumatic, normocephalic   Eyes: EOMI, no conjunctival pallor, no scleral icterus   ENMT: clear oropharynx, moist oral mucosa, fair dentition   Neck: supple, no JVD, midline trachea   Cardiovascular: RRR, normal S1/S2, SEM grade 4 at LUSB  Respiratory: clear to auscultation, good air entry, no accessory muscle use   Abdomen: +BS soft, nontender, nondistended  Extremities: trace lower extremity swelling. Warm.   Skin: No rashes. Nl turgor    Labs:    Lab Results   Component Value Date    WBC 61.6 (>) 12/28/2018    HGB 8.5 (L) 12/28/2018    HCT 27.9 (L) 12/28/2018    MCV 85 12/28/2018    PLT 266 12/28/2018       Lab Results  Component Value Date    TIBC 208 (L) 12/28/2018    FEBINDSAT 86 (HH) 12/28/2018       Lab Results   Component Value Date    NA 141 12/28/2018    K 4.2 12/28/2018 CL 106 12/28/2018    CO2 19 (L) 12/28/2018    BUN 42 (H) 12/28/2018    CREAT 1.98 (H) 12/28/2018    GLUCOSE 86 12/28/2018       Lab Results   Component Value Date    CREAT 1.98 (H) 12/28/2018       Lab Results   Component Value Date    CALCIUM 9.5 12/28/2018    CALCIUM 9.4 12/28/2018    PHOS 4.3 12/28/2018    MG 1.6 12/28/2018    ALBUMIN 4.3 12/28/2018       Lab Results   Component Value Date    PTHINT 74 (H) 12/28/2018       No results found for: CHOL, CHOLHDL, TRIGLY, NOHDLCHOCAL    No results found for: PHVEN, PCO2VEN, PO2VEN, BICARBVEN, BEVEN    No results found for: PHART, PCO2ART, PO2ART, BICARBART, BEART, O2SATART, FIO2ART    Lab Results   Component Value Date    SPECGRAVUR 1.015 12/28/2018    PHUR 5.5 12/28/2018    BLDUR 3+ (A) 12/28/2018    BILIUR Negative 12/28/2018    KETONESUR Negative 12/28/2018    GLUCOSEUR Negative 12/28/2018    PROTCLUR 2+ (A) 12/28/2018    NITRITEUR Negative 12/28/2018    LEUKESTUR Negative 12/28/2018    RBCSUR >30 (A) 12/28/2018    WBCSUR 6-10 (A) 12/28/2018       Lab Results   Component Value Date    TPCREAT 1,400 (H) 12/28/2018       No results found for: Gunnar Fusi, CLRANUR, CREATRANUR      Outside record lab:  11/12/18   CBC WBC 45.5, Hb 10.3, Plt 177  TG 157, TC 112, HDL 26, cLDL 55  BUN 45, Cr 1.8. eGFR 33  [place] 9.7, Phos 4.8, Alb 4.3  Na 142, K 3.5, Cl 104, tCO2 25    Cr 2.1 on 09/02/18  2 on 07/01/18      Imaging:  US kidneys 07/2018 from outside record  Rt kidney 9.1 cm - multiple small non-obstructing stones visualized, largest 0.42 cm  Lt kidney 8.3 cm    Impression:  84 y.o.female with long-standing HTN, MDS, hypothyroidism here to establish care    Assessment:  #CKD: Stage G3b/4/A3  - Secondary to hypertensive nephrosclerosis +/- secondary FSGS  - Baseline creatinine 1.8-2  - Small kidneys on renal US. Per patient, RAS was ruled out    # Proteinuria  - Proteinuria: Goal UPC < 336 531 3613 mg/day  - ACR 314 mg/g, UPCR 1.4 g/g  - start olmesartan 20 mg daily # Nephrolithiasis  - uric acid stone? Given uric acid crystals on last UA  - check uric acid/Cr ratio and serum uric acid  - goal urine pH 6-6.5  - start BiCitra 15 mL PO BID    #Hypertension- at goal BP < 130/80.  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    - on rosuvastatin 10 mg daily    #Immunizations -    Influenza: Oct 2020   Pneumovax: ?   Prevnar 13: ?   Hepatitis B: not sure    There is no immunization history on file for this patient.     #Transplant status - unlikely to be a candidate given advanced age    Summary:  - check uric acid level and repeat UA  - start BiCitra 15 mL BID daily to alkalinize urine and treat metabolic acidosis of CKD  - start olmesartan 20 mg daily and check   - check home BP  - check uric acid/Cr ratio and will repeat urine pH at next visit.    General CKD Recommendations:  -Recommend 0.8g/kg of protein intake for estimated gfr <60. If on dialysis, recommend  1.2 gm/kg/day in CKD patients on dialysis.  -Avoid nephrotoxins including NSAIDs and chinese herbs  -renally dose all medications to estimated GFR  -If estimated GFR <30, avoid gadolinium exposure with MRI.  -avoid iodinated contrast, if possible.  If contrast is needed, please contact us for recommendations.    Return to clinic: 2 mo with labs prior    Deneise Getty  Nephrology Fellow

## 2019-01-19 NOTE — Progress Notes
Renal Attending    I saw and evaluated the patient. I discussed the management with the housestaff. I reviewed the housestaff's notes and I agreed with findings and plan of care that we formulated.

## 2019-01-21 ENCOUNTER — Ambulatory Visit: Payer: PRIVATE HEALTH INSURANCE

## 2019-01-21 ENCOUNTER — Telehealth: Payer: MEDICARE

## 2019-01-21 DIAGNOSIS — N200 Calculus of kidney: Secondary | ICD-10-CM

## 2019-01-21 DIAGNOSIS — N179 Acute kidney failure, unspecified: Secondary | ICD-10-CM

## 2019-01-21 DIAGNOSIS — N2581 Secondary hyperparathyroidism of renal origin: Secondary | ICD-10-CM

## 2019-01-21 DIAGNOSIS — E790 Hyperuricemia without signs of inflammatory arthritis and tophaceous disease: Secondary | ICD-10-CM

## 2019-01-21 DIAGNOSIS — N184 Chronic kidney disease, stage 4 (severe): Secondary | ICD-10-CM

## 2019-01-21 NOTE — Telephone Encounter
Reply by:    OK

## 2019-01-21 NOTE — Telephone Encounter
LABS RESULTED TODAY   Please review labs.    Is ok to fax over?

## 2019-01-21 NOTE — Telephone Encounter
Message to Practice/Provider    MD: Dr. Hewitt Shorts    Message: Pt requesting lab results from 12/14 to be faxed to outside Nephrologist. Pt has appt today at 10:30am.    Hillary Bow, M.D.  Phone: [phone]  Fax: [phone]      Return call is not being requested by the patient or caller.    Patient or caller has been notified of the 24-48 hour processing turnaround time if applicable.

## 2019-01-22 MED ORDER — ALLOPURINOL 100 MG PO TABS
50 mg | ORAL_TABLET | Freq: Every day | ORAL | 1 refills | Status: AC
Start: 2019-01-22 — End: ?

## 2019-01-22 MED ORDER — SEVELAMER CARBONATE 800 MG PO TABS
800 mg | ORAL_TABLET | Freq: Three times a day (TID) | ORAL | 1 refills | Status: AC
Start: 2019-01-22 — End: ?

## 2019-01-22 NOTE — Telephone Encounter
Labs faxed

## 2019-01-22 NOTE — Telephone Encounter
Renal Attending:     Lab results noted and sent to fellow also.

## 2019-01-22 NOTE — Patient Instructions
-   Avoid food high in purines/uric acid such as alcoholic beverages, soft drinks, organ meats such as liver, fish, seafood and shellfish, bacon, [place], veal, venison.   - Also avoid food high in phosphorus such as processed food with additives and dairy products.

## 2019-01-22 NOTE — Progress Notes
Lab reviewed.  Note Cr increase.  Uric acid 15.  High phosphorus.  Hb 8 - stable - getting Arenesp at outside clinic    Plan  - stop olmesartan and repeat lab in 2 weeks  - start allopurinol 50 mg once daily to lower uric acid and continue BiCitra  - start sevelamer 800 mg PO TID with meals to lower phosphorus  - will consider substituting diltiazem for metoprolol if no compelling indication for beta blockage for additional antiproteinuric effect.    sent message through [facility] and left a voice message to the patient's daughter Joelene Millin.    DWA Dr.Kurtz

## 2019-02-08 ENCOUNTER — Ambulatory Visit: Payer: MEDICARE

## 2019-02-23 ENCOUNTER — Ambulatory Visit: Payer: PRIVATE HEALTH INSURANCE

## 2019-02-23 DIAGNOSIS — N200 Calculus of kidney: Secondary | ICD-10-CM

## 2019-02-23 DIAGNOSIS — N184 Chronic kidney disease, stage 4 (severe): Secondary | ICD-10-CM

## 2019-02-23 DIAGNOSIS — N189 Chronic kidney disease, unspecified: Secondary | ICD-10-CM

## 2019-02-23 DIAGNOSIS — D631 Anemia in chronic kidney disease: Secondary | ICD-10-CM

## 2019-03-01 ENCOUNTER — Ambulatory Visit: Payer: PRIVATE HEALTH INSURANCE | Attending: Nephrology

## 2019-03-01 ENCOUNTER — Ambulatory Visit: Payer: MEDICARE

## 2019-03-01 ENCOUNTER — Ambulatory Visit: Payer: PRIVATE HEALTH INSURANCE

## 2019-03-01 DIAGNOSIS — N2581 Secondary hyperparathyroidism of renal origin: Secondary | ICD-10-CM

## 2019-03-01 DIAGNOSIS — E790 Hyperuricemia without signs of inflammatory arthritis and tophaceous disease: Secondary | ICD-10-CM

## 2019-03-01 DIAGNOSIS — R801 Persistent proteinuria, unspecified: Secondary | ICD-10-CM

## 2019-03-01 DIAGNOSIS — N184 Chronic kidney disease, stage 4 (severe): Secondary | ICD-10-CM

## 2019-03-01 DIAGNOSIS — N200 Calculus of kidney: Secondary | ICD-10-CM

## 2019-03-01 DIAGNOSIS — Z862 Personal history of diseases of the blood and blood-forming organs and certain disorders involving the immune mechanism: Secondary | ICD-10-CM

## 2019-03-01 DIAGNOSIS — D631 Anemia in chronic kidney disease: Secondary | ICD-10-CM

## 2019-03-01 DIAGNOSIS — N189 Chronic kidney disease, unspecified: Secondary | ICD-10-CM

## 2019-03-01 DIAGNOSIS — I10 Essential (primary) hypertension: Secondary | ICD-10-CM

## 2019-03-01 MED ORDER — FERRIC CITRATE 1 GM 210 MG(FE) PO TABS
1 | ORAL_TABLET | Freq: Three times a day (TID) | ORAL | 1 refills | Status: AC
Start: 2019-03-01 — End: ?

## 2019-03-01 MED ORDER — DILTIAZEM HCL ER COATED BEADS 180 MG PO CP24
180 mg | ORAL_CAPSULE | Freq: Every day | ORAL | 3 refills | Status: AC
Start: 2019-03-01 — End: ?

## 2019-03-01 MED ORDER — SODIUM BICARBONATE 650 MG PO TABS
650 mg | ORAL_TABLET | Freq: Two times a day (BID) | ORAL | 2 refills | Status: AC
Start: 2019-03-01 — End: ?

## 2019-03-01 NOTE — Progress Notes
OUTPATIENT RENAL CONSULTATION    Referring Physician:  No ref. provider found     Reason for Consultation:  Management of CKD    History of Present Illness:  85 y.o. African American female with CKD, HTN, MDS and hypothyroidism. She is here to establish care with nephrology, accompanied by her daughter.    She lives in [place] but has just moved to [facility] to stay with her daughter for the next 6 months. She follows with her nephrologist,  hematologist and her PCP in [place].  She has history of long-standing hypertension.     Kidney disease was diagnosed about 4 years ago and was told it ''rapidly progressed''.  Per patient, about 4 years ago she underwent an invasive vascular study, which sounds like renal artery study and was told there was only some calcification and that the kidneys were not the cause of high blood pressure.  She also has history of MDS - diagnosed by bone marrow biopsy. She receives monthly Aranesp in clinic but she does not know the dose.   Renal US in June 2020 from outside record reviewed. Noted small stones <58mm in the right kidneys w/o hydronephrosis. Patient denies passing any stone or history of urologic intervention. No symptoms. She had one episode of gross hematuria in Sep 2020 and was told there was no infection. It resolved spontaneouslu. Did not have cold symptoms at that time. No pain. No dysuria. No feeling of incomplete bladder emptying. No foamy urine.  No diabetes.     Last visit: 01/25/19  Olmesartan ordered but Cr elevated to 2.8 (from baseline Cr 1.8) so it was not started.  Cr trending down to 1.6.     Interval events  Doing well. No recent illness or hospitalization.    C/o difficulty swallowing.  Nausea with sevelamer.  BiCitra 15 mL PO BID.    C/o nocturia 2-3 times a night. ''Normal'' amount of urine. Drinks a lot of water.  Compliant with renal diet. Trying to switch to plant-based diet.   Also reports nausea after taking BiCitra.    Home BP: 130-150/60-70s Mild leg swelling    Family history:  Sisters have kidney disease not on dialysis.    Review of Systems:  Besides what was mentioned above a 14-pt review of systems was obtained and found to be negative or normal.    Past Medical History:   Diagnosis Date   ? CKD (chronic kidney disease)    ? Hyperlipidemia    ? Hypertension    ? Leukemia (HCC/RAF)    ? Myelodysplasia (myelodysplastic syndrome) (HCC/RAF)          No past surgical history on file.    Allergies   Allergen Reactions   ? Penicillins Hives   ? Sulfa Antibiotics Agranulocytosis   ? Naproxen Rash     Aleve       No outpatient medications have been marked as taking for the 03/01/19 encounter (Appointment) with Sheron Nightingale, MD.       Social History     Socioeconomic History   ? Marital status: Widowed     Spouse name: Not on file   ? Number of children: Not on file   ? Years of education: Not on file   ? Highest education level: Not on file   Occupational History   ? Not on file   Social Needs   ? Financial resource strain: Not on file   ? Food insecurity     Worry:  Not on file     Inability: Not on file   ? Transportation needs     Medical: Not on file     Non-medical: Not on file   Tobacco Use   ? Smoking status: Former Smoker   ? Smokeless tobacco: Never Used   Substance and Sexual Activity   ? Alcohol use: Not Currently   ? Drug use: Not on file   ? Sexual activity: Not on file   Lifestyle   ? Physical activity     Days per week: Not on file     Minutes per session: Not on file   ? Stress: Not on file   Relationships   ? Social Wellsite geologist on phone: Not on file     Gets together: Not on file     Attends religious service: Not on file     Active member of club or organization: Not on file     Attends meetings of clubs or organizations: Not on file     Relationship status: Not on file   Other Topics Concern   ? Not on file   Social History Narrative   ? Not on file       No family history on file.    Physical Examination: There were no vitals taken for this visit.  General: NAD   Head: Atraumatic, normocephalic   Eyes: EOMI, no conjunctival pallor, no scleral icterus   ENMT: clear oropharynx, moist oral mucosa, fair dentition   Neck: supple, no JVD, midline trachea   Cardiovascular: RRR, normal S1/S2, SEM grade 4 at LUSB  Respiratory: clear to auscultation, good air entry, no accessory muscle use   Abdomen: +BS soft, nontender, nondistended  Extremities: trace lower extremity swelling. Warm.   Skin: No rashes. Nl turgor    Labs:    Lab Results   Component Value Date    WBC 60.8 (>) 01/18/2019    HGB 8.0 (L) 01/18/2019    HCT 26.6 (L) 01/18/2019    MCV 90 01/18/2019    PLT 226 01/18/2019       Lab Results   Component Value Date    TIBC 180 (L) 01/18/2019    FEBINDSAT 42 01/18/2019       Lab Results   Component Value Date    NA 138 02/03/2019    K 3.5 02/03/2019    CL 98 02/03/2019    CO2 22 02/03/2019    BUN 36 (H) 02/03/2019    CREAT 2.10 (H) 02/03/2019    GLUCOSE 80 02/03/2019       Lab Results   Component Value Date    CREAT 2.10 (H) 02/03/2019    CREAT 2.89 (H) 01/18/2019    CREAT 1.98 (H) 12/28/2018       Lab Results   Component Value Date    CALCIUM 8.6 (L) 02/03/2019    PHOS 5.9 (H) 01/18/2019    MG 1.6 12/28/2018    ALBUMIN 4.2 01/18/2019       Lab Results   Component Value Date    VITD25OH 51.1 01/18/2019    PTHINT 144 (H) 01/18/2019       Lab Results   Component Value Date    CHOL 77 (L) 01/18/2019    CHOLHDL 19 (L) 01/18/2019    TRIGLY 165 (H) 01/18/2019    NOHDLCHOCAL 28 01/18/2019       No results found for: PHVEN, PCO2VEN, PO2VEN, BICARBVEN, BEVEN    No  results found for: PHART, PCO2ART, PO2ART, Satira Anis, FIO2ART    Lab Results   Component Value Date    SPECGRAVUR 1.011 01/18/2019    PHUR 5.5 01/18/2019    BLDUR 3+ (A) 01/18/2019    BILIUR Negative 01/18/2019    KETONESUR Negative 01/18/2019    GLUCOSEUR Negative 01/18/2019    PROTCLUR 2+ (A) 01/18/2019    NITRITEUR Negative 01/18/2019 LEUKESTUR 1+ (A) 01/18/2019    RBCSUR >30 (A) 01/18/2019    WBCSUR 11-30 (A) 01/18/2019       Lab Results   Component Value Date    TPCREAT 1,957 (H) 01/18/2019    TPCREAT 1,400 (H) 12/28/2018       No results found for: Gunnar Fusi, CLRANUR, CREATRANUR      Imaging:  US kidneys 07/2018 from outside record  Rt kidney 9.1 cm - multiple small non-obstructing stones visualized, largest 0.42 cm  Lt kidney 8.3 cm    Impression:  85 y.o.female with long-standing HTN, MDS, hypothyroidism here to establish care    Assessment:  #CKD: Stage G3b/4/A3  - Secondary to hypertensive nephrosclerosis +/- secondary FSGS  - Baseline creatinine 1.8-2  - Small kidneys on renal US. Per patient, RAS was ruled out    # Proteinuria  - Proteinuria: Goal UPC < [phone] mg/day  - UPCR 1.4 g/g --> 1.2 g/g  - Not on RAASi due to advanced CKD  - start diltiazem 180 mg daily for anti-proteinuric effect    # Nephrolithiasis  - uric acid stone? Given uric acid crystals on last UA  - check 24 hour urine for stone risk factors after holding allopurinol for a week  - currently on allopurinol 50 mg daily    #Hypertension- at goal BP < 130/80.  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    - on rosuvastatin 10 mg daily    #Immunizations -    Influenza: Oct 2020   Pneumovax: ?   Prevnar 13: ? Hepatitis B: not sure --> check HBV serology at next visit   COVID-19: 02/24/18    There is no immunization history on file for this patient.     #Transplant status - unlikely to be a candidate given advanced age    Summary:  - check 24 hour urine for stone risk factors after holding allopurinol for a week  - check VBG  - switch to diltiazem 180 mg daily for antiproteinuric effect and stop metoprolol   - plan to taper off clonidine at next visits  - switch to ferric citrate for combined phosphorus binding effects and iron supplement  - switch to sodium bicarbonate  from [place] (trial - due to nausea/bloating)  - refer to nutrition for CKD/stone diet    General CKD Recommendations:  -Recommend 0.8g/kg of protein intake for estimated gfr <60. If on dialysis, recommend  1.2 gm/kg/day in CKD patients on dialysis.  -Avoid nephrotoxins including NSAIDs and chinese herbs  -renally dose all medications to estimated GFR  -If estimated GFR <30, avoid gadolinium exposure with MRI.  -avoid iodinated contrast, if possible.  If contrast is needed, please contact us for recommendations.    Return to clinic: 4 weeks with labs prior    Ashana Tullo  Nephrology Fellow

## 2019-03-01 NOTE — Patient Instructions
Stop metoprolol. Take diltiazem 180 mg once daily for blood pressure.  Stop sevelamer and switch to ferric citrate (the new phosphorus binders)  Continue clonidine 0.1 mg twice daily and amlodipine.

## 2019-03-18 ENCOUNTER — Telehealth: Payer: MEDICARE

## 2019-03-18 ENCOUNTER — Ambulatory Visit: Payer: PRIVATE HEALTH INSURANCE | Attending: Student in an Organized Health Care Education/Training Program

## 2019-03-18 ENCOUNTER — Ambulatory Visit: Payer: PRIVATE HEALTH INSURANCE | Attending: Nephrology

## 2019-03-18 DIAGNOSIS — Z0000 Encounter for general adult medical examination without abnormal findings: Secondary | ICD-10-CM

## 2019-03-19 NOTE — Telephone Encounter
Please see call back request

## 2019-03-19 NOTE — Telephone Encounter
Call Back Request    MD:  Dr. Hewitt Shorts    Reason for call back: [facility] called and advised that they need to speak to doctor about the 24 hour urine test. Please ask to speak to [facility] at [phone]    Any Symptoms:  []  Yes  [x]  No      ? If yes, what symptoms are you experiencing:    o Duration of symptoms (how long):    o Have you taken medication for symptoms (OTC or Rx):      Patient or caller has been notified of the 24-48 hour turnaround time.

## 2019-03-23 ENCOUNTER — Telehealth: Payer: MEDICARE

## 2019-03-23 NOTE — Telephone Encounter
Appointment Accommodation Request    MD Name: Dr. Nicoletta Dress    Appointment Type: New    Reason for sooner request: Pt's nephrologist referred her to Dr. Nicoletta Dress. Persistent protein in the urine, hyperuremia, anemia, type 4 kidney disease, hyperphosphatemia.  Please send a mychart message first or if they do not answer call.    Date/Time Requested (If any): any except on 02/22 at 10:20 because of another appt    Last seen by MD: n/a    Any Symptoms:  [x]  Yes  []  No      ? If yes, what symptoms are you experiencing:   o Duration of symptoms (how long):     Patient was offered an appointment but declined.    Patient was advised to seek emergency services if conditions are urgent or emergent.    Patient has been notified of the 24-48 hour turnaround time.  Thank you

## 2019-03-24 ENCOUNTER — Ambulatory Visit: Payer: PRIVATE HEALTH INSURANCE

## 2019-03-24 ENCOUNTER — Ambulatory Visit: Payer: MEDICARE

## 2019-03-24 ENCOUNTER — Telehealth: Payer: PRIVATE HEALTH INSURANCE

## 2019-03-24 NOTE — Telephone Encounter
Was advised to callback in an hour

## 2019-03-25 MED ORDER — MG PLUS PROTEIN 133 MG PO TABS
133 mg | ORAL_TABLET | Freq: Two times a day (BID) | ORAL | 1 refills | 34.00000 days | Status: AC
Start: 2019-03-25 — End: ?

## 2019-03-25 NOTE — Progress Notes
Restart KCl 20 mEq daily.  Add Mg 133 mg PO BID.

## 2019-03-25 NOTE — Telephone Encounter
Pt and daughter confirmed appt.

## 2019-03-29 ENCOUNTER — Ambulatory Visit: Payer: MEDICARE

## 2019-03-29 DIAGNOSIS — N184 Chronic kidney disease, stage 4 (severe): Secondary | ICD-10-CM

## 2019-03-29 DIAGNOSIS — R801 Persistent proteinuria, unspecified: Secondary | ICD-10-CM

## 2019-03-29 DIAGNOSIS — N200 Calculus of kidney: Secondary | ICD-10-CM

## 2019-03-29 DIAGNOSIS — I10 Essential (primary) hypertension: Secondary | ICD-10-CM

## 2019-03-29 DIAGNOSIS — N189 Chronic kidney disease, unspecified: Secondary | ICD-10-CM

## 2019-03-29 DIAGNOSIS — D631 Anemia in chronic kidney disease: Secondary | ICD-10-CM

## 2019-03-29 MED ORDER — MAGNESIUM OXIDE 400 (241.3 MG) MG PO TABS
400 mg | ORAL_TABLET | Freq: Every day | ORAL | 2 refills | Status: AC
Start: 2019-03-29 — End: ?

## 2019-03-29 MED ORDER — DILTIAZEM HCL ER COATED BEADS 240 MG PO CP24
240 mg | ORAL_CAPSULE | Freq: Every day | ORAL | 2 refills | Status: AC
Start: 2019-03-29 — End: ?

## 2019-03-29 NOTE — Progress Notes
OUTPATIENT RENAL CONSULTATION    Referring Physician: Salley Slaughter, MD      Reason for Consultation:  Management of CKD    History of Present Illness:  85 y.o. African American female with CKD, HTN, MDS and hypothyroidism.     She lives in [place] but has moved to [facility] to stay with her daughter. She follows with her nephrologist,  hematologist and her PCP in [place].  She has history of long-standing hypertension.     Kidney disease was diagnosed about 4 years ago and was told it ''rapidly progressed''.  Per patient, about 4 years ago she underwent an invasive vascular study, which sounds like renal artery study and was told there was only some calcification and that the kidneys were not the cause of high blood pressure.  She also has history of MDS - diagnosed by bone marrow biopsy. She receives monthly Aranesp in clinic but she does not know the dose.   Renal US in June 2020 from outside record reviewed. Noted small stones <70mm in the right kidneys w/o hydronephrosis. Patient denies passing any stone or history of urologic intervention. No symptoms. She had one episode of gross hematuria in Sep 2020 and was told there was no infection. It resolved spontaneouslu. Did not have cold symptoms at that time. No pain. No dysuria. No feeling of incomplete bladder emptying. No foamy urine.  No diabetes.    Interval events  Doing well. No recent illness or hospitalization.    Bloating better with bicarb pills.  Good appetite. No problem swallowing.  Constipated.  Compliant with CKD diet recommendations.    Home BP: 125-149/52-65, HR 80-90s  Mild leg swelling  No orthostatic symptoms    No chest pain or dyspnea  Takes gabapentin 100 mg qhs for neuropathy. Minimal effects? But reports imbalance but not falls.    Restarted KCl 20 mEq/day.  Unable to fill Mg Protein Rx at pharmacy.  Got Aranesp shot at home.    Moving back to [place] in April. Completed SARS-CoV2 vaccine - 2nd dose last week. No significant side effects.      Review of Systems:  Besides what was mentioned above a 14-pt review of systems was obtained and found to be negative or normal.    Past Medical History:   Diagnosis Date   ? CKD (chronic kidney disease)    ? Hyperlipidemia    ? Hypertension    ? Leukemia (HCC/RAF)    ? Myelodysplasia (myelodysplastic syndrome) (HCC/RAF)        No past surgical history on file.    Allergies   Allergen Reactions   ? Penicillins Hives   ? Sulfa Antibiotics Agranulocytosis   ? Naproxen Rash     Aleve       No outpatient medications have been marked as taking for the 03/29/19 encounter (Appointment) with Sheron Nightingale, MD.       Social History     Socioeconomic History   ? Marital status: Widowed     Spouse name: Not on file   ? Number of children: Not on file   ? Years of education: Not on file   ? Highest education level: Not on file   Occupational History   ? Not on file   Social Needs   ? Financial resource strain: Not on file   ? Food insecurity     Worry: Not on file     Inability: Not on file   ? Transportation needs  Medical: Not on file     Non-medical: Not on file   Tobacco Use   ? Smoking status: Former Smoker   ? Smokeless tobacco: Never Used   Substance and Sexual Activity   ? Alcohol use: Not Currently   ? Drug use: Not on file   ? Sexual activity: Not on file   Lifestyle   ? Physical activity     Days per week: Not on file     Minutes per session: Not on file   ? Stress: Not on file   Relationships   ? Social Wellsite geologist on phone: Not on file     Gets together: Not on file     Attends religious service: Not on file     Active member of club or organization: Not on file     Attends meetings of clubs or organizations: Not on file     Relationship status: Not on file   Other Topics Concern   ? Not on file   Social History Narrative   ? Not on file       No family history on file.   Family history: Sisters have kidney disease not on dialysis.    Physical Examination:  There were no vitals taken for this visit.  General: NAD   Head: Atraumatic, normocephalic   Eyes: EOMI, no conjunctival pallor, no scleral icterus   ENMT: clear oropharynx, moist oral mucosa, fair dentition   Neck: supple, no JVD, midline trachea   Cardiovascular: RRR, normal S1/S2, SEM grade 4 at LUSB  Respiratory: clear to auscultation, good air entry, no accessory muscle use   Abdomen: +BS soft, nontender, nondistended  Extremities: trace lower extremity swelling. Warm.   Skin: No rashes. Nl turgor    Labs:    Lab Results   Component Value Date    WBC 60.8 (>) 01/18/2019    HGB 9.6 (L) 03/23/2019    HCT 26.6 (L) 01/18/2019    MCV 90 01/18/2019    PLT 226 01/18/2019       Lab Results   Component Value Date    TIBC 176 (L) 03/23/2019    FEBINDSAT 47 03/23/2019       Lab Results   Component Value Date    NA 141 03/23/2019    K 3.2 (L) 03/23/2019    CL 96 03/23/2019    CO2 23 03/23/2019    BUN 37 (H) 03/23/2019    CREAT 1.96 (H) 03/23/2019    GLUCOSE 78 03/23/2019       Lab Results   Component Value Date    CREAT 1.96 (H) 03/23/2019    CREAT 1.69 (H) 02/25/2019    CREAT 2.10 (H) 02/03/2019    CREAT 2.89 (H) 01/18/2019    CREAT 1.98 (H) 12/28/2018       Lab Results   Component Value Date    CALCIUM 8.5 (L) 03/23/2019    PHOS 2.7 (L) 03/23/2019    MG 1.5 (L) 03/23/2019    ALBUMIN 3.5 (L) 02/25/2019       Lab Results   Component Value Date    VITD25OH 51.1 01/18/2019    PTHINT 95 (H) 02/25/2019       Lab Results   Component Value Date    CHOL 77 (L) 01/18/2019    CHOLHDL 19 (L) 01/18/2019    TRIGLY 165 (H) 01/18/2019    NOHDLCHOCAL 28 01/18/2019       Lab Results  Component Value Date    PHVEN 7.41 03/01/2019    PCO2VEN 49 03/01/2019    PO2VEN 23 03/01/2019    BICARBVEN 30.4 03/01/2019    BEVEN 6 03/01/2019       No results found for: PHART, PCO2ART, PO2ART, BICARBART, BEART, O2SATART, FIO2ART    Lab Results   Component Value Date SPECGRAVUR 1.011 01/18/2019    PHUR 5.5 01/18/2019    BLDUR 3+ (A) 01/18/2019    BILIUR Negative 01/18/2019    KETONESUR Negative 01/18/2019    GLUCOSEUR Negative 01/18/2019    PROTCLUR 2+ (A) 01/18/2019    NITRITEUR Negative 01/18/2019    LEUKESTUR 1+ (A) 01/18/2019    RBCSUR >30 (A) 01/18/2019    WBCSUR 11-30 (A) 01/18/2019       Lab Results   Component Value Date    TPCREAT 1,171 (H) 03/23/2019    TPCREAT 1,225 (H) 02/25/2019    TPCREAT 1,957 (H) 01/18/2019       Lab Results   Component Value Date    CREATRANUR 32 03/17/2019    CREATRANUR 34 03/17/2019         Imaging:  US kidneys 07/2018 from outside record  Rt kidney 9.1 cm - multiple small non-obstructing stones visualized, largest 0.42 cm  Lt kidney 8.3 cm    Impression:  85 y.o.female with long-standing HTN, MDS, hypothyroidism here to establish care    Assessment:  #CKD: Stage G3b/4/A3  - Secondary to hypertensive nephrosclerosis +/- secondary FSGS  - Baseline creatinine 1.8-2  - Small kidneys on renal US. Per patient, RAS was ruled out    # Proteinuria  - Proteinuria: Goal UPC < [phone] mg/day  - UPCR 1.4 g/g --> 1.2 g/g  - Not on RAASi due to advanced CKD  - increase diltiazem CD to 240 mg daily (from 180 mg daily) for anti-proteinuric effect    # Nephrolithiasis  - uric acid stone? Given uric acid crystals   - 24-hr urine 03/01/19: volume 1.45 L, Cr 489 mg/day, Na 113 mmol/day, uric acid 700 mg/day (off allopurinol), Mg 3.3 mEq/day, oxalate 26 mg/day, [place] 61 mg/day, Phos <14, citrate 62 mg/day    #Hypertension- at goal BP < 130/80.  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    - on rosuvastatin 10 mg daily    #Immunizations -    Influenza: Oct 2020   Pneumovax: ?   Prevnar 13: ?   Hepatitis B: not sure --> HBsAb neg --> will give a booster dose at next visit   COVID-19: 02/24/18    There is no immunization history on file for this  patient.     #Transplant status - unlikely to be a candidate given advanced age    Summary:  - increase diltiazem CD 240 mg daily for hypertension and proteinuria  - restart KCl 20 mEq/day  - start MgO 400 mg daily    General CKD Recommendations:  -Recommend 0.8g/kg of protein intake for estimated gfr <60.   -Avoid nephrotoxins including NSAIDs and chinese herbs  -renally dose all medications to estimated GFR  -If estimated GFR <30, avoid gadolinium exposure with MRI.  -avoid iodinated contrast, if possible.  If contrast is needed, please contact us for recommendations.    Return to clinic: 6-8 weeks with labs prior    Boonphiphop Boonpheng  Nephrology Fellow       Nephrology attending     I evaluated and discussed the case with the house staff/fellow and agree with the findings and plan of care as documented.    Desma Paganini, MD 03/29/2019

## 2019-03-29 NOTE — Patient Instructions
Increase diltiazem to 240 mg once daily.  Start magnesium oxide 400 mg once daily.  Start potassium pill once a day.  Repeat blood work in 2 weeks

## 2019-04-15 ENCOUNTER — Ambulatory Visit: Payer: PRIVATE HEALTH INSURANCE

## 2019-04-15 ENCOUNTER — Telehealth: Payer: MEDICARE

## 2019-04-15 DIAGNOSIS — R801 Persistent proteinuria, unspecified: Secondary | ICD-10-CM

## 2019-04-15 DIAGNOSIS — D631 Anemia in chronic kidney disease: Secondary | ICD-10-CM

## 2019-04-15 DIAGNOSIS — N189 Chronic kidney disease, unspecified: Secondary | ICD-10-CM

## 2019-04-15 DIAGNOSIS — N184 Chronic kidney disease, stage 4 (severe): Secondary | ICD-10-CM

## 2019-04-15 DIAGNOSIS — I129 Hypertensive chronic kidney disease with stage 1 through stage 4 chronic kidney disease, or unspecified chronic kidney disease: Secondary | ICD-10-CM

## 2019-04-15 MED ORDER — DARBEPOETIN ALFA 100 MCG/ML IJ SOLN
100 ug | SUBCUTANEOUS | 4 refills | Status: AC
Start: 2019-04-15 — End: ?

## 2019-04-15 MED ORDER — DARBEPOETIN ALFA 100 MCG/ML IJ SOLN
100 ug | SUBCUTANEOUS | 4 refills | Status: AC
Start: 2019-04-15 — End: 2019-04-16

## 2019-04-15 NOTE — Progress Notes
PATIENT: Lindsay Brennan  MRN: [id number]  DOB: 12/11/1934  DATE OF SERVICE: 04/15/2019    REFERRING PRACTITIONER: Joselyn Arrow., MD  PRIMARY CARE PROVIDER: Salley Slaughter, MD    REASON FOR REFERRAL:     Chief Complaint   Patient presents with   ? New Consult   ? Nutrition Counseling       Subjective:      Lindsay Brennan is a 85 y.o. female who presents for nutrition counseling for stage 4 renal insufficiency, hypokalemia and protein uria.  Has been doing well.  Would like not to go on dialysis.  Confused with what to eat.Compliant with her meds and other physician recommendation.    No past surgical history on file.  Social History     Socioeconomic History   ? Marital status: Widowed     Spouse name: Not on file   ? Number of children: Not on file   ? Years of education: Not on file   ? Highest education level: Not on file   Occupational History   ? Not on file   Social Needs   ? Financial resource strain: Not on file   ? Food insecurity     Worry: Not on file     Inability: Not on file   ? Transportation needs     Medical: Not on file     Non-medical: Not on file   Tobacco Use   ? Smoking status: Never Smoker   ? Smokeless tobacco: Never Used   Substance and Sexual Activity   ? Alcohol use: Not Currently   ? Drug use: Not on file   ? Sexual activity: Not on file   Lifestyle   ? Physical activity     Days per week: Not on file     Minutes per session: Not on file   ? Stress: Not on file   Relationships   ? Social Wellsite geologist on phone: Not on file     Gets together: Not on file     Attends religious service: Not on file     Active member of club or organization: Not on file     Attends meetings of clubs or organizations: Not on file     Relationship status: Not on file   Other Topics Concern   ? Not on file   Social History Narrative   ? Not on file     No family history on file.    There are no active problems to display for this patient.    Outpatient Medications Prior to Visit   Medication Sig ? aspirin 81 mg EC tablet Take 81 mg by mouth daily.   ? darbepoetin alfa 100 mcg/mL SOLN injection Inject 1 mL (100 mcg total) under the skin every thirty (30) days.   ? dilTIAZem (CARDIZEM CD) 240 mg 24 hr capsule Take 1 capsule (240 mg total) by mouth daily.   ? esomeprazole 40 mg capsule    ? Ferric Citrate 1 GM 210 MG(Fe) TABS Take 1 tablet by mouth three (3) times daily with meals.   ? gabapentin 100 mg capsule Take 100 mg by mouth.   ? magnesium oxide 400 mg tablet Take 1 tablet (400 mg total) by mouth daily.   ? mirtazapine 15 mg tablet TAKE ONE TABLET BY MOUTH EVERY NIGHT   ? rosuvastatin 10 mg tablet Take 10 mg by mouth.   ? sodium bicarbonate 650 mg tablet Take 1 tablet (  650 mg total) by mouth two (2) times daily.   ? SYNTHROID 50 mcg tablet    ? SYNTHROID 75 mcg tablet    ? amLODIPine 5 mg tablet    ? benzonatate 100 mg capsule Take 100 mg by mouth three (3) times daily as needed for Cough.     No facility-administered medications prior to visit.       Allergies   Allergen Reactions   ? Penicillins Hives   ? Sulfa Antibiotics Agranulocytosis   ? Naproxen Rash     Aleve       Review of Systems:    General: Denies any fever, chills, night sweats.  Respiratory: Denies any shortness of breath, wheezes, or coughs.   Cardiovascular: Denies any chest pain, palpitations, PND, orthopnea, or peripheral edema.   GI: Denies any nausea, vomiting, constipation, diarrhea, hematemesis, bright red blood per rectum.   GU: Denies any dysuria, urgency, frequency.   Neurological: Denies any headache, seizure activity, paresthesias.   Psychological: Denies any history of depression, suicidal or homicidal ideation.     Weight history:   Wt Readings from Last 3 Encounters:   04/15/19 49.4 kg (109 lb)   03/29/19 49.4 kg (109 lb)   03/01/19 50.8 kg (112 lb 1.6 oz)     Vitals:   Last Recorded Vital Signs:    04/15/19 1217   BP: 119/56   Pulse: 92   Temp: 36.1 ?C (96.9 ?F)         Objective:      General:   alert, appears stated age and cooperative   Psychiatric:   oriented to time, place and person, mood and affect are within normal limits     Lab Review:     Lab Results   Component Value Date    WBC 60.8 (>) 01/18/2019    HGB 9.9 (L) 04/12/2019    HCT 26.6 (L) 01/18/2019    PLT 226 01/18/2019    NEUTABS 32.2 (H) 01/18/2019    LYMPHABS 6.1 (H) 01/18/2019     Lab Results   Component Value Date    NA 141 03/23/2019    K 3.2 (L) 03/23/2019    CL 96 03/23/2019    GLUCOSE 78 03/23/2019    CREAT 1.96 (H) 03/23/2019    CALCIUM 8.5 (L) 03/23/2019    ALBUMIN 3.5 (L) 02/25/2019     No results found for: HGBA1C       Results for orders placed or performed in visit on 01/18/19   Lipid Panel   Result Value Ref Range    Cholesterol, Total ([facility]) 77 (L) 100 - 199 mg/dL    Triglycerides ([facility]) 165 (H) 0 - 149 mg/dL    HDL Cholesterol ([facility]) 19 (L) >39 mg/dL    VLDL Cholesterol Cal ([facility]) 28 5 - 40 mg/dL    LDL Chol Calc (NI[facility]) ([facility]) 30 0 - 99 mg/dL    Narrative    Specimen Comment: Called/faxed to KURTZ, I on 01/20/2019 at 18:02 ET for  Specimen Comment: test(s) WBC  Specimen Comment: Test(s) WBC called to DR Everett Graff on 01/20/2019 at 18:59  Specimen Comment: EST  Performed at:  [facility]  [address]  [address], [place], [place]  [id number]  Lab Director: Shawnie Dapper MD, Phone:  [phone]     Lab Results   Component Value Date    CHOL 77 (L) 01/18/2019    CHOLHDL 19 (L) 01/18/2019    TRIGLY  165 (H) 01/18/2019     24 Hour Dietary Recalls:    Breakfast:  1 egg  +1 slice bread   Snack:    Lunch:  sausage  patty   Snack:      apple   Dinner:  [place] burger and milk shake   Snack:    Beverage:         Assessment:      1. Chronic kidney disease due to hypertension     2. Anemia in stage 4 chronic kidney disease (HCC/RAF)     3. Hypokalemia     4. Persistent proteinuria          Body mass index is 20.6 kg/m?.  Plan/ Recommendation:       Meal:  More cooked veggie+ protein at each meal  -plant based diet as much as possible  -sodium 2 grams/day    Calorie goal: 1200-1500 cal/day    Protein goal: 50-65 grams per day  -evenly distributed in 3 meal  -lean protein source, 3 oz fish, 3-4 egg white, chicken/turkey breast 3 oz  -protein shake:   pea, or soy protein powder + almond milk/soy milk in the morning     Vegetables as much as possible, > 3 cups/day  -cooked in addition to salad  -varieties > 2 colors    Starch, including sweet potato, yams, potato, beets, corn, dry beans  -one serving/meal  -2 fruits    Fat  -canola, grape seeds, and coconut oil for cooking  -olive oil for cold dish  -avocado, 1/2/serving  -nuts 10 pieces/snack    Beverage:  Water or plain tea    Physical activity:   -stay active as much as possible  -walking twice a day as tolerated, 2.5 miles/hours      -home made foods as much as possible  -keep food record       The above recommendations were discussed with the patient. The patient had all questions answered satisfactorily and is in agreement with this recommended plan of care.       Pt seen in [place] office for 30 mins    Author:  Alain Marion 04/15/2019 2:26 PM

## 2019-04-15 NOTE — Patient Instructions
Meal:  More cooked veggie+ protein at each meal  -plant based diet as much as possible  -sodium 2 grams/day    Calorie goal: 1200-1500 cal/day    Protein goal: 50-65 grams per day  -evenly distributed in 3 meal  -lean protein source, 3 oz fish, 3-4 egg white, chicken/turkey breast 3 oz  -protein shake:   pea, or soy protein powder + almond milk/soy milk in the morning   -red meat 1-2 per week.    Vegetables as much as possible, > 3 cups/day  -cooked in addition to salad  -varieties > 2 colors    Starch, including sweet potato, yams, potato, beets, corn, dry beans  -one serving/meal  -2 fruits    Fat  -canola, grape seeds, and coconut oil for cooking  -olive oil for cold dish  -avocado, 1/2/serving  -nuts 10 pieces/snack    Beverage:  Water or plain tea  -wine 1 glass/week    Physical activity:   -stay active as much as possible  -walking twice a day as tolerated, 2.5 miles/hours      -home made foods as much as possible  -keep food record       Protein Foods  (140 Calorie Average per serving)     Food Item One Serving Calories Protein (gm)   Breakfast      Egg whites 7 whites 115 25   Nonfat cottage cheese 1 cup 140 28   Greek yogurt 1 cup 120 20   Protein shakes 1 scoop whey/pea/soy powder + almond milk 200 25   Lunch and Dinner      [place] Breast  3 ounces, cooked weight  135 25   Chicken Breast 3 ounces, cooked weight  140 25   Lean Red Meat 3 ounces, cooked weight 145-160 25   Ocean-Caught Fish  4 ounces, cooked weight 130-170 25-31   Shrimp, crab, lobster 4 ounces, cooked weight 120 22-24   Tuna 4 ounces, water pack 145 27   Scallops 4 ounces, cooked weight 135 25   Vegetarian      Soy Beans-Edamame              ? cup 90 9   Tofu, firm ?  cup 180 20 (varies)   Protein Powders      Whey 1 scoop 130 20-25   Pea 1 scoop 130 20-25   Soy  1 scoop 130 20-25   Rice  1 scoop 120-150 20-25   Egg white 1 scoop 150 25-30   Hemp 1 scoop 150-200 25-30   Protein Snacks      Cheese, reduced fat 1 ounce 50-80 7   Cheese, Parmesan  3 Tablespoons 80 6   Cheese, Mozzarella, lowfat 1 ounce (1 stick) 70 6   Protein Foods  (140 Calorie Average per serving)     Food Item One Serving Calories Protein (gm)   Breakfast      Egg whites 7 whites 115 25   Greek yogurt 1 cup 120 20   Protein shakes 1 scoop whey/pea/soy powder + almond milk 200 25   Lunch and Dinner      [place] Breast  3 ounces, cooked weight  135 25   Chicken Breast 3 ounces, cooked weight  140 25   Lean Red Meat 3 ounces, cooked weight 145-160 25   Ocean-Caught Fish  4 ounces, cooked weight 130-170 25-31   Shrimp, crab, lobster 4 ounces, cooked weight 120 22-24   Tuna 4 ounces,  water pack 145 27   Scallops 4 ounces, cooked weight 135 25   Vegetarian      Soy Beans-Edamame              ? cup 90 9   Tofu, firm ?  cup 180 20 (varies)   Protein Powders      Whey 1 scoop 130 20-25   Pea 1 scoop 130 20-25   Soy  1 scoop 130 20-25   Rice  1 scoop 120-150 20-25   Egg white 1 scoop 150 25-30   Hemp 1 scoop 150-200 25-30

## 2019-04-15 NOTE — Nursing Note
Medical Specialty Video Visit     I have contacted the patient to ensure that he/she is ready for today's video visit.     [] Left a message on the patient's voicemail reminding patient of video visit with MD today.           Advised patient to download MyChart mobile app and complete the eCheck-in questionnaire and video consent.                     Provided MyChart Support Number: [phone]     [x] Patient has downloaded MyChart mobile app   [x] Patient has completed eCheck-in questionnaire   [x] Patient has completed Video Visit Consent   [x] Patient was instructed to join the Video Visit 5-10 minutes before the appointment,     [x] I have assisted the patient in preparing for today's video visit with MD; downloading of MyChart mobile app and how to join Video Visit.     Vital Signs reported by patient.  Allergy and Medication reconciliation completed.    Refills requested:  [] Yes   [x] No 2  Pharmacy confirmed: [x] Yes  [] No    Nurse: Serafina Topham AQUINO Levonne Carreras SR. LVN

## 2019-04-16 ENCOUNTER — Ambulatory Visit: Payer: MEDICARE

## 2019-04-16 ENCOUNTER — Telehealth: Payer: PRIVATE HEALTH INSURANCE

## 2019-04-16 DIAGNOSIS — N184 Chronic kidney disease, stage 4 (severe): Secondary | ICD-10-CM

## 2019-04-16 DIAGNOSIS — I129 Hypertensive chronic kidney disease with stage 1 through stage 4 chronic kidney disease, or unspecified chronic kidney disease: Secondary | ICD-10-CM

## 2019-04-16 DIAGNOSIS — N179 Acute kidney failure, unspecified: Secondary | ICD-10-CM

## 2019-04-16 NOTE — Telephone Encounter
Reply by:     Pt wants to come to clinic for EPO shot on Monday. Will order. Is it possible to schedule a nurse visit? Thanks

## 2019-04-16 NOTE — Telephone Encounter
Please see img below.   Darbepoetin is not a covered medication. Below is a list. (all will still need an authorization)   Please advise if you wish to change medication or submit for Darbepotin auth?

## 2019-04-16 NOTE — Addendum Note
Addended by: Greggory Keen on: 04/16/2019 03:15 PM     Modules accepted: Orders

## 2019-04-16 NOTE — Telephone Encounter
Please advise if we have this medication on hand

## 2019-04-16 NOTE — Telephone Encounter
Reply by:    Let me talk to the patient first. Will let you know. Thx

## 2019-04-19 ENCOUNTER — Telehealth: Payer: PRIVATE HEALTH INSURANCE

## 2019-04-19 ENCOUNTER — Ambulatory Visit: Payer: PRIVATE HEALTH INSURANCE

## 2019-04-19 ENCOUNTER — Ambulatory Visit: Payer: MEDICARE

## 2019-04-19 ENCOUNTER — Institutional Professional Consult (permissible substitution): Payer: PRIVATE HEALTH INSURANCE

## 2019-04-19 DIAGNOSIS — N184 Chronic kidney disease, stage 4 (severe): Secondary | ICD-10-CM

## 2019-04-19 MED ORDER — DILTIAZEM HCL ER COATED BEADS 300 MG PO CP24
300 mg | ORAL_CAPSULE | Freq: Every day | ORAL | 3 refills | Status: AC
Start: 2019-04-19 — End: 2019-05-04

## 2019-04-19 MED ADMIN — EPOETIN ALFA-EPBX 10000 UNIT/ML IJ SOLN: 20000 [IU] | SUBCUTANEOUS | @ 19:00:00 | Stop: 2019-04-19 | NDC 00069130810

## 2019-04-19 NOTE — Progress Notes
Increase diltiazem to 300 mg PO daily for anti-proteinuric effect.  Home BP: 140s-150/60s.

## 2019-04-26 ENCOUNTER — Ambulatory Visit: Payer: PRIVATE HEALTH INSURANCE

## 2019-04-26 ENCOUNTER — Ambulatory Visit: Payer: MEDICARE

## 2019-04-26 ENCOUNTER — Telehealth: Payer: MEDICARE

## 2019-04-26 DIAGNOSIS — N184 Chronic kidney disease, stage 4 (severe): Secondary | ICD-10-CM

## 2019-04-26 DIAGNOSIS — E039 Hypothyroidism, unspecified: Secondary | ICD-10-CM

## 2019-04-26 DIAGNOSIS — M25512 Pain in left shoulder: Secondary | ICD-10-CM

## 2019-04-26 DIAGNOSIS — E782 Mixed hyperlipidemia: Secondary | ICD-10-CM

## 2019-04-26 DIAGNOSIS — D469 Myelodysplastic syndrome, unspecified: Secondary | ICD-10-CM

## 2019-04-26 DIAGNOSIS — I10 Essential (primary) hypertension: Secondary | ICD-10-CM

## 2019-04-26 MED ORDER — LIDOCAINE 5 % EX PTCH
1 | MEDICATED_PATCH | Freq: Two times a day (BID) | TRANSDERMAL | 0 refills | 30.00000 days | Status: AC
Start: 2019-04-26 — End: 2019-05-11

## 2019-04-26 MED ORDER — ACETAMINOPHEN-CODEINE #3 300-30 MG PO TABS
.5 | ORAL_TABLET | Freq: Four times a day (QID) | ORAL | 0 refills | 6.00000 days | Status: AC | PRN
Start: 2019-04-26 — End: 2019-05-11

## 2019-04-26 NOTE — Telephone Encounter
Hein(pharmacist informed).

## 2019-04-26 NOTE — Telephone Encounter
Call from pharmacy re:  lidocaine 5% patch         Rx written for qt 10.  Comes in a box of 30, which pharmacy prefers not to break box of 30.  Okay to approve qt 30?

## 2019-04-26 NOTE — Progress Notes
PATIENT: Lindsay Brennan  MRN: [id number]  DOB: 1934/04/24  DATE OF SERVICE: 04/26/2019    PRIMARY CARE PROVIDER: Salley Slaughter, MD    CHIEF COMPLAINT:   Chief Complaint   Patient presents with   ? Establish Care   ? Discuss BP meds   ? Lt Shoulder pain        Subjective:      Lindsay Brennan is a 85 y.o. female h/o as stated below p/w   Chief Complaint   Patient presents with   ? Establish Care   ? Discuss BP meds   ? Lt Shoulder pain    and the following current concerns.      Current Concerns:  New pt.  Lives summers in [place], winters in [place].    Presents with daughter.    Acute left shoulder pain x3 weeks, intermittent.  She has tried tylenol, CBD oil, heat without improvement.  Pain 5-6/10 but can get 9/10.  It radiates up her neck.  No numbness or tingling in fingers.  No known trauma to shoulder.    She has h/o CKD 4 and HTN.  She currently sees Nephrology.      H/o MDS, sees Hematologist in [place].    H/o hypothyroidism.  She is on Synthroid but unsure of dose.    H/o HLP, on crestor, but unsure of correct dose.    Chronic Problems:   Patient Active Problem List   Diagnosis   ? Anemia in chronic kidney disease   ? Chronic kidney disease due to hypertension   ? Hypernatremia   ? Hypokalemia   ? Proteinuria       Current Medications:   Current Outpatient Medications   Medication Sig   ? aspirin 81 mg EC tablet Take 81 mg by mouth daily.   ? dilTIAZem (CARDIZEM CD) 300 mg 24 hr capsule Take 1 capsule (300 mg total) by mouth daily.   ? esomeprazole 40 mg capsule    ? gabapentin 100 mg capsule Take 100 mg by mouth.   ? mirtazapine 15 mg tablet TAKE ONE TABLET BY MOUTH EVERY NIGHT   ? potassium chloride 10 mEq CR capsule    ? amLODIPine 5 mg tablet    ? benzonatate 100 mg capsule Take 100 mg by mouth three (3) times daily as needed for Cough.   ? darbepoetin alfa 100 mcg/mL SOLN injection Inject 1 mL (100 mcg total) under the skin every thirty (30) days.   ? Ferric Citrate 1 GM 210 MG(Fe) TABS Take 1 tablet by mouth three (3) times daily with meals.   ? lisinopril 20 mg tablet    ? magnesium oxide 400 mg tablet Take 1 tablet (400 mg total) by mouth daily.   ? rosuvastatin 10 mg tablet Take 10 mg by mouth.   ? rosuvastatin 20 mg tablet    ? sodium bicarbonate 650 mg tablet Take 1 tablet (650 mg total) by mouth two (2) times daily.   ? SYNTHROID 50 mcg tablet    ? SYNTHROID 75 mcg tablet      No current facility-administered medications for this visit.         Allergies: Oxycodone-acetaminophen, Penicillins, Sulfa antibiotics, and Naproxen    Past Surgical History:   Procedure Laterality Date   ? CESAREAN SECTION     ? MASTECTOMY Bilateral    ? TOTAL ABDOMINAL HYSTERECTOMY         Family History   Problem Relation Age of Onset   ?  Stroke Mother    ? Emphysema Father    ? Ovarian cancer Sister    ? Heart attack Brother    ? Lung cancer Sister    ? Lung cancer Sister    ? Colon cancer Brother    ? Colon cancer Son        Social History     Socioeconomic History   ? Marital status: Widowed     Spouse name: Not on file   ? Number of children: Not on file   ? Years of education: Not on file   ? Highest education level: Not on file   Occupational History   ? Not on file   Social Needs   ? Financial resource strain: Not on file   ? Food insecurity     Worry: Not on file     Inability: Not on file   ? Transportation needs     Medical: Not on file     Non-medical: Not on file   Tobacco Use   ? Smoking status: Never Smoker   ? Smokeless tobacco: Never Used   Substance and Sexual Activity   ? Alcohol use: Not Currently   ? Drug use: Never   ? Sexual activity: Not on file   Lifestyle   ? Physical activity     Days per week: Not on file     Minutes per session: Not on file   ? Stress: Not on file   Relationships   ? Social Wellsite geologist on phone: Not on file     Gets together: Not on file     Attends religious service: Not on file     Active member of club or organization: Not on file     Attends meetings of clubs or organizations: Not on file     Relationship status: Not on file   Other Topics Concern   ? Need Help Feeding Yourself? Not Asked   ? Need Help Getting Dressed? Not Asked   ? Need Help Using the Telephone? Not Asked   ? Need Help Managing Money? Tree surgeon, [employer]) Not Asked   ? Need Help Shopping for Groceries? Not Asked   ? Need Help Getting Places Beyond Walking Distance? [employer], [employer]) Not Asked   ? Need Help Getting from Bed to Chair? Not Asked   ? Need Help Bathing or Showering? Not Asked   ? Need Help Taking your Medications? Not Asked   ? Need Help Doing Moderately Strenuous Housework? (ex. Laundry) Not Asked   ? Need Help Driving? Not Asked   ? Need Help Getting to the Toilet? Not Asked   ? Need Help Walking Across the Road? (Includes [place]) Not Asked   ? Need Help Preparing Meals? Not Asked   ? Need Help Shopping for Personal Items? (Toiletries, Medicines) Not Asked   ? Need Help Climbing a Flight of Stairs? Not Asked   ? Do you live with someone who assists you at home? Not Asked   ? Do you get help from family members or friends in your home? Not Asked   ? Do you employ someone to provide health related care or help you in your home? Not Asked   ? Do you provide care for a family member? Not Asked   ? Does your home have rugs in the hallway? Not Asked   ? Does your home have poor lighting? Not Asked   ? Does your  home lack grab bars in the bathroom? Not Asked   ? Does your home lack handrails on the stairs? Not Asked   ? Have you noticed any hearing difficulties? Not Asked   ? Do you currently participate in any regular activity to improve or maintain your physical fitness? Not Asked   ? Do you always wear a seatbelt when you ride in a car? Not Asked   ? If you drink alcohol, do you drink more than 7 drinks per week or more than 3 drinks on any given day? Not Asked   ? Has anyone ever been concerned about your drinking? Not Asked   ? Do you exercise at least a day, 3 or more days a week? No   ? Types of Exercise? (List in Comments) No   ? Do you follow a special diet? No   ? Vegan? No   ? Vegetarian? No   ? Pescatarian? No   ? Lactose Free? No   ? Gluten Free? No   ? Omnivore? No   Social History Narrative   ? Not on file       Review of Systems: A 14-system review of systems was performed and is negative except as stated in the history of present illness.  Constitutional: negative for chills, fevers, night sweats and weight loss  ENT: negative bad breath  Eyes: negative discharge from eyes  Respiratory: negative for cough, dyspnea on exertion and wheezing  Cardiovascular: negative for chest pain, dyspnea, lower extremity edema and palpitations  GI: negative nausea, vomiting, diarrhea  GU: no urinary incontinence  MSK: negative muscle twitching  Integumentary: negative cracking of skin  Neurological: negative syncope or disorientation  Psychiatric: negative nervousness  Endocrine: negative excessive thirst  Heme/Lymph: negative enlarged lymph nodes  Allergic/Immunologic: negative hives       Objective:        BP 138/60  ~ Pulse 83  ~ Temp 36 ?C (96.8 ?F) (Forehead)  ~ Ht 5' 1'' (1.549 m)  ~ Wt 109 lb (49.4 kg)  ~ SpO2 98%  ~ BMI 20.60 kg/m?     General: WN,WD in NAD, cooperative  Head: NC/AT  Eyes: EOMI, conjunctiva clear  Ears: normal TMs and external ear canals both ears  Neck: supple, trachea midline  Lungs: CTAB, no w/r/r  Heart: RRR, S1 and S2 normal, no m/r/g  MSK: L shoulder w/o TTP, positive weakness with abduction and lift off  Neuro: Grossly intact  Psychiatric: answers appropriately, mood and affect WNLs    Lab Review:   Lab Visit on 04/19/2019   Component Date Value   ? Uric Acid 04/19/2019 11.9*   ? Magnesium 04/19/2019 1.6    ? Phosphorus 04/19/2019 4.1    ? Ionized Ca++,Uncorrected 04/19/2019 1.20    ? Ionized Ca++,Corrected 04/19/2019 1.14    ? Sodium 04/19/2019 139    ? Potassium 04/19/2019 3.8    ? Chloride 04/19/2019 101    ? Total CO2 04/19/2019 23    ? Anion Gap 04/19/2019 15    ? Glucose 04/19/2019 96    ? Creatinine 04/19/2019 2.06*   ? GFR Estimate for African* 04/19/2019 25    ? GFR Estimate for Non-Afr* 04/19/2019 22    ? GFR Additional Informati* 04/19/2019 See Comment    ? Urea Nitrogen 04/19/2019 47*   ? Calcium 04/19/2019 9.2    ? Prot/Creat Ratio,Ur 04/19/2019 1.4*   ? Protein,Urine 04/19/2019 76    ? Creatinine,Random Urine 04/19/2019 55.5    ?  Sodium,Random Urine 04/19/2019 48    ? Urea Nitrogen,Random Uri* 04/19/2019 457    ? Uric Acid,Random Urine 04/19/2019 56.6    ? Urine Color 04/19/2019 Light-Yellow    ? Specific Gravity 04/19/2019 1.012    ? pH,Urine 04/19/2019 5.5    ? Blood 04/19/2019 Trace*   ? Bilirubin 04/19/2019 Negative    ? Ketones 04/19/2019 Negative    ? Glucose 04/19/2019 Negative    ? Protein 04/19/2019 1+*   ? Leukocyte Esterase 04/19/2019 Negative    ? Nitrite 04/19/2019 Negative    ? RBC per uL 04/19/2019 0    ? WBC per uL 04/19/2019 5    ? RBC per HPF 04/19/2019 0    ? WBC per HPF 04/19/2019 1    ? Bacteria 04/19/2019 Present*   ? Squamous Epi Cells 04/19/2019 0    ? Hyaline Casts 04/19/2019 3-5/LPF*   ? Granular Casts 04/19/2019 Present*   Office Visit on 03/29/2019   Component Date Value   ? Glucose, Serum ([facility]) 04/12/2019 85    ? BUN ([facility]) 04/12/2019 50*   ? Creatinine, Serum ([facility]* 04/12/2019 3.20*   ? eGFR If NonAfricn Am ([facility]* 04/12/2019 13*   ? eGFR If Africn Am ([facility]* 04/12/2019 15*   ? BUN/Creatinine Ratio ([facility]* 04/12/2019 16    ? Sodium, Serum ([facility]) 04/12/2019 138    ? Potassium, Serum ([facility]* 04/12/2019 4.2    ? Chloride, Serum ([facility]) 04/12/2019 100    ? Carbon Dioxide, Total (L* 04/12/2019 21    ? Calcium, Serum ([facility]) 04/12/2019 8.9    ? Magnesium, Serum ([facility]* 04/12/2019 1.9    ? Calcium, Serum ([facility]) 04/12/2019 9.2    ? PTH (Intact Assay), Seru* 04/12/2019 68*   ? Phosphorus, Serum ([facility]* 04/12/2019 4.7*   ? Ferritin, Serum ([facility]) 04/12/2019 566*   ? Iron Bind.Cap.(TIBC) ([facility]* 04/12/2019 237*   ? UIBC ([facility]) 04/12/2019 69*   ? Iron, Serum ([facility]) 04/12/2019 168*   ? Iron Saturation ([facility]) 04/12/2019 71*   ? Creatinine, Urine ([facility]* 04/12/2019 132.8    ? Protein,Total,Urine (Lab* 04/12/2019 158.3    ? Protein/Creat Ratio (Lab* 04/12/2019 1,192*   ? Albumin, Serum ([facility]) 04/12/2019 3.8    ? Hemoglobin ([facility]) 04/12/2019 9.9*   Lab Visit on 03/18/2019   Component Date Value   ? Citric Acid,urine mg/L 03/17/2019 43    ? Citric Acid,urine mg/24 * 03/17/2019 62*   ? Citric Acid/Crt ratio, u* 03/17/2019 134*   ? Hours Collected 03/17/2019 24    ? Total Urine Volume 03/17/2019 1450    ? Creatinine, Urine - per * 03/17/2019 32    ? Creatinine, Urine - per * 03/17/2019 464    ? Phosphorus,Timed Urine 03/17/2019     ? Phosphorus,Conc Urine 03/17/2019 <14    ? Hours Collected 03/17/2019 24    ? Total Volume 03/17/2019 1,450    ? Calcium,Timed,Ur 03/17/2019 61    ? Calcium,Conc ur 03/17/2019 4.2    ? Hours Collected 03/17/2019 24    ? Total Volume 03/17/2019 1,450    ? Creatinine, Conc, Urine 03/17/2019 33.7    ? Creatinine,Timed Urine 03/17/2019 489*   ? Hours Collected 03/17/2019 24    ? Total Volume 03/17/2019 1,450    ? Oxalate,urine 03/17/2019 26    ? Oxalate,urine random 03/17/2019 18    ? Hours Collected 03/17/2019 24    ? Total Urine Volume 03/17/2019 1450    ? Creatinine, Urine - per * 03/17/2019 34    ?  Creatinine, Urine - per * 03/17/2019 493    ? Magnesium,Timed Urine 03/17/2019 3.3    ? Magnesium,Conc Urine 03/17/2019 2.3    ? Hours Collected 03/17/2019 24    ? Total Volume 03/17/2019 1,450    ? Uric Acid,Timed, Ur 03/17/2019 700    ? Uric Acid,Conc Urine 03/17/2019 48.3    ? Hours Collected 03/17/2019 24    ? Total Volume 03/17/2019 1,450    ? Sodium,Conc Urine 03/17/2019 78    ? Sodium,Timed,Urine 03/17/2019 113    ? Hours Collected 03/17/2019 24    ? Total Volume 03/17/2019 1,450    Lab Visit on 03/01/2019   Component Date Value   ? pH 03/01/2019 7.41    ? pCO2 03/01/2019 49    ? pO2 03/01/2019 23    ? Bicarbonate 03/01/2019 30.4    ? Base Excess 03/01/2019 6    ? O2 Sat/Measured 03/01/2019 25.7    ? Patient on oxygen 03/01/2019 No    Office Visit on 03/01/2019   Component Date Value   ? Glucose, Serum ([facility]) 03/23/2019 78    ? BUN ([facility]) 03/23/2019 37*   ? Creatinine, Serum ([facility]* 03/23/2019 1.96*   ? eGFR If NonAfricn Am ([facility]* 03/23/2019 23*   ? eGFR If Africn Am ([facility]* 03/23/2019 27*   ? BUN/Creatinine Ratio ([facility]* 03/23/2019 19    ? Sodium, Serum ([facility]) 03/23/2019 141    ? Potassium, Serum ([facility]* 03/23/2019 3.2*   ? Chloride, Serum ([facility]) 03/23/2019 96    ? Carbon Dioxide, Total (L* 03/23/2019 23    ? Calcium, Serum ([facility]) 03/23/2019 8.5*   ? Phosphorus, Serum ([facility]* 03/23/2019 2.7*   ? Ferritin, Serum ([facility]) 03/23/2019 476*   ? Iron Bind.Cap.(TIBC) ([facility]* 03/23/2019 176*   ? UIBC ([facility]) 03/23/2019 94*   ? Iron, Serum ([facility]) 03/23/2019 82    ? Iron Saturation ([facility]) 03/23/2019 47    ? Creatinine, Urine ([facility]* 03/23/2019 124.0    ? Protein,Total,Urine (Lab* 03/23/2019 145.2    ? Protein/Creat Ratio (Lab* 03/23/2019 1,171*   ? Magnesium, Serum ([facility]* 03/23/2019 1.5*   ? Hemoglobin ([facility]) 03/23/2019 9.6*   ? Uric Acid, Serum ([facility]* 03/23/2019 12.0*   ? Hepatitis B Surf Ab Quan* 03/23/2019 <3.1*   ? HBsAg Screen ([facility]) 03/23/2019 Negative    ? Hep B Core Ab, Tot ([facility]* 03/23/2019 Negative            Assessment & Plan:          85 y.o. old female  has a past medical history of CKD (chronic kidney disease), History of CVA (cerebrovascular accident) (2011), History of ductal carcinoma in situ (DCIS) of breast, Hyperlipidemia, Hypertension, Hypothyroidism, Leukemia (HCC/RAF), and Myelodysplasia (myelodysplastic syndrome) (HCC/RAF). had concerns including Establish Care, Discuss BP meds, and Lt Shoulder pain., p/w establishing care.    1. Acute pain of left shoulder  New, x3 weeks, no known trauma.  Exam with weakness c/f RC injury.  - XR shoulder ap int-ext left (2 views); Future  - Referral to Rehabilitation, Physical Therapy  - lidocaine 5% patch; Place 1 patch onto the skin every twelve (12) hours 12 hours on 12 hours off..  Dispense: 10 patch; Refill: 0  - acetaminophen-codeine (TYLENOL #3) 300-30 mg tablet; Take 0.5 tablets by mouth every six (6) hours as needed for Severe Pain (Pain Scale 7-10). Max Daily Amount: 2 tablets  Dispense: 20 tablet; Refill: 0    2. Myelodysplasia (myelodysplastic syndrome) (HCC/RAF)  New pt.  -continue f/u  Hematologist in [place].    3. Acquired hypothyroidism  New pt.  -will get outside record to see dose of Synthroid    4. Essential hypertension  New pt.  -continue mgmt per Nephrology: diltiazem increased recently, pt unsure of medications    5. Mixed hyperlipidemia  New pt.  -pt unsure of dose of crestor    6. Stage 4 chronic kidney disease (HCC/RAF)  New pt.  -continue mgmt per Nephrology    75 minutes were spent personally by me today on this encounter which may include today's pre-visit review of the chart, time spent during the visit, and today's time spent after the visit  documenting and coordinating care.        #Routine Health Maintenance:  Health Maintenance Topics with due status: Completed       Topic Last Completion Date    COVID-19 Vaccine 03/24/2019       Health Maintenance Due   Topic Date Due   ? Tdap/Td Vaccine (1 - Tdap) Never done   ? Shingles (Shingrix) Vaccine (1 of 2) Never done   ? Annual Preventive Wellness Visit  Never done   ? Advance Directive  Never done   ? Pneumococcal Vaccine (1 of 2 - PCV13) Never done   ? Osteoporosis Early Detection DEXA Scan  Never done   ? Influenza Vaccine (1) 10/06/2018       Mammogram: No results found. n/a  Pap: n/a  Colon CA: 08/2018 polyp  Immunizations: Flu: n/a Tdap: per pt UTD Pneumo per pt UTD Zoster defer  DEXA: per pt neg  Hx of Smoking/Abd Ultrasound  (M>65yo any smoking)/annual CTscan (M/F >50yo 20pack year and smoked past 15 yrs): n/a  HCV: due next lab    Follow up: Return if symptoms worsen or fail to improve.          Author:  Sharen Counter, MD 04/26/2019 11:11 AM      The above plan of care, diagnosis, orders, and follow-up were discussed with the patient.  Questions related to this recommended plan of care were answered.

## 2019-04-26 NOTE — Telephone Encounter
Reply by: Harle Stanford Sherrian Divers  Yes 30 is fine, thank you

## 2019-04-26 NOTE — Patient Instructions
[facility]  [phone]  [place] [place], [place] [address]  Radiology:   *Accepts walk-in appointments for XRAY only, no appointments at 12pm-1pm*   After Hours Clinic:   *Monday-Friday. 5pm- 9pm , Saturday 9am - 1pm*    _______________________________________  Physical Therapy Recommendations:    [facility]  [place], [place] [address]  Tel: [phone]  Fax: [phone]    [facility]  [address]  [place], [place] [address]  [phone]    [facility]   [address] [place], [place] [address]  Phone: [phone]  Fax: [phone]    [facility]  [place], [place] [address]  Phone: [phone]    [facility]  [address] [place], [place] [address]  [phone]    [facility]  [address]  [place], [place] [address]  Phone: [phone]    [facility] [address] [place], [place] [address]        [phone]

## 2019-04-27 ENCOUNTER — Ambulatory Visit: Payer: MEDICARE

## 2019-04-27 DIAGNOSIS — D631 Anemia in chronic kidney disease: Secondary | ICD-10-CM

## 2019-04-27 DIAGNOSIS — N189 Chronic kidney disease, unspecified: Secondary | ICD-10-CM

## 2019-04-27 DIAGNOSIS — N184 Chronic kidney disease, stage 4 (severe): Secondary | ICD-10-CM

## 2019-04-28 NOTE — Telephone Encounter
Reply by:    Hello.   Patient wants to come in again on Monday for another Retocrit shot.  Can we also re-submit prior auth for Darbopoetin?    Thank you.

## 2019-04-28 NOTE — Addendum Note
Addended by: Greggory Keen on: 04/28/2019 01:57 PM     Modules accepted: Orders

## 2019-04-30 ENCOUNTER — Ambulatory Visit: Payer: Commercial Managed Care - Pharmacy Benefit Manager

## 2019-05-03 ENCOUNTER — Ambulatory Visit: Payer: PRIVATE HEALTH INSURANCE

## 2019-05-03 ENCOUNTER — Ambulatory Visit: Payer: MEDICARE

## 2019-05-03 ENCOUNTER — Telehealth: Payer: PRIVATE HEALTH INSURANCE

## 2019-05-03 DIAGNOSIS — D631 Anemia in chronic kidney disease: Secondary | ICD-10-CM

## 2019-05-03 DIAGNOSIS — I10 Essential (primary) hypertension: Secondary | ICD-10-CM

## 2019-05-03 DIAGNOSIS — N184 Chronic kidney disease, stage 4 (severe): Secondary | ICD-10-CM

## 2019-05-03 DIAGNOSIS — N200 Calculus of kidney: Secondary | ICD-10-CM

## 2019-05-03 DIAGNOSIS — N189 Chronic kidney disease, unspecified: Secondary | ICD-10-CM

## 2019-05-03 DIAGNOSIS — R801 Persistent proteinuria, unspecified: Secondary | ICD-10-CM

## 2019-05-03 DIAGNOSIS — M7532 Calcific tendinitis of left shoulder: Secondary | ICD-10-CM

## 2019-05-03 LAB — Magnesium: MAGNESIUM: 1.7 meq/L (ref 1.4–1.9)

## 2019-05-03 LAB — CBC: RED CELL DISTRIBUTION WIDTH-SD: 80.5 fL — ABNORMAL HIGH (ref 36.9–48.3)

## 2019-05-03 MED ORDER — DILTIAZEM HCL ER COATED BEADS 180 MG PO CP24
180 mg | ORAL_CAPSULE | Freq: Every day | ORAL | 3 refills | Status: AC
Start: 2019-05-03 — End: 2019-05-11

## 2019-05-03 NOTE — Telephone Encounter
Per patients daughter can you please place a referral to ortho sports, pt is also scheduled for the shoulder injection for 05/06/19

## 2019-05-03 NOTE — Patient Instructions
Decrease diltiazem to 180 mg daily.   Repeat blood work today.

## 2019-05-03 NOTE — Telephone Encounter
Patient is scheduled for today at 430pm

## 2019-05-03 NOTE — Telephone Encounter
Patients daughter called and is looking to schedule an appt with you today for a shoulder injection, I do see the MyChart msg from yesterday to schedule the pt today at noon but the only appt time you have is a 12:30pm    Are you ok with me scheduling for today at 12:30pm?      Please advise

## 2019-05-03 NOTE — Progress Notes
PATIENT: Lindsay Brennan  MRN: [id number]  DOB: 1934-11-01  DATE OF SERVICE: 05/03/2019    PRIMARY CARE PROVIDER: Sharen Counter., MD    CHIEF COMPLAINT:   Chief Complaint   Patient presents with   ? Shoulder Injection     left         Subjective:      Lindsay Brennan is a 85 y.o. female h/o as stated below p/w   Chief Complaint   Patient presents with   ? Shoulder Injection     left     and the following current concerns.      Current Concerns:  Patient presents for follow up L shoulder pain.    She was seen 04/26/2019 and was started on tylenol with codeine without much relief.  She starts PT tomorrow and wants to get an injection to see if it will help with her pain.  She has been using lidocaine patch as prescribed.    Symptoms constant.  No radiation unless noted.  No alleviating or aggravating factors except otherwise noted.  Patient has not tried anything for symptoms except as noted.    Chronic Problems:   Patient Active Problem List   Diagnosis   ? Anemia in chronic kidney disease   ? Chronic kidney disease due to hypertension   ? Hypernatremia   ? Hypokalemia   ? Proteinuria   ? Myelodysplasia (myelodysplastic syndrome) (HCC/RAF)   ? Leukemia (HCC/RAF)   ? Hypothyroidism   ? Hypertension   ? Hyperlipidemia   ? History of ductal carcinoma in situ (DCIS) of breast   ? History of CVA (cerebrovascular accident)   ? CKD (chronic kidney disease)       Current Medications:   Current Outpatient Medications   Medication Sig   ? acetaminophen-codeine (TYLENOL #3) 300-30 mg tablet Take 0.5 tablets by mouth every six (6) hours as needed for Severe Pain (Pain Scale 7-10). Max Daily Amount: 2 tablets   ? aspirin 81 mg EC tablet Take 81 mg by mouth daily.   ? darbepoetin alfa 100 mcg/mL SOLN injection Inject 1 mL (100 mcg total) under the skin every thirty (30) days.   ? dilTIAZem (CARDIZEM CD) 180 mg 24 hr capsule Take 1 capsule (180 mg total) by mouth daily.   ? esomeprazole 40 mg capsule    ? Ferric Citrate 1 GM 210 MG(Fe) TABS Take 1 tablet by mouth three (3) times daily with meals.   ? gabapentin 100 mg capsule Take 100 mg by mouth.   ? lidocaine 5% patch Place 1 patch onto the skin every twelve (12) hours 12 hours on 12 hours off..   ? magnesium oxide 400 mg tablet Take 1 tablet (400 mg total) by mouth daily.   ? mirtazapine 15 mg tablet TAKE ONE TABLET BY MOUTH EVERY NIGHT   ? potassium chloride 10 mEq CR capsule    ? rosuvastatin 10 mg tablet Take 10 mg by mouth.   ? rosuvastatin 20 mg tablet    ? sodium bicarbonate 650 mg tablet Take 1 tablet (650 mg total) by mouth two (2) times daily.   ? SYNTHROID 50 mcg tablet Pt alternates with 75mg .   ? SYNTHROID 75 mcg tablet Pt alternates with 50mg .     Current Facility-Administered Medications   Medication Dose Route Frequency   ? epoetin alfa-epbx (Retacrit) 10,000 units/mL inj 20,000 Units  20,000 Units Subcutaneous Once        Allergies: Oxycodone-acetaminophen, Penicillins, Sulfa antibiotics, and  Naproxen    Past Surgical History:   Procedure Laterality Date   ? CESAREAN SECTION     ? MASTECTOMY Bilateral    ? TOTAL ABDOMINAL HYSTERECTOMY         Family History   Problem Relation Age of Onset   ? Stroke Mother    ? Emphysema Father    ? Ovarian cancer Sister    ? Heart attack Brother    ? Lung cancer Sister    ? Lung cancer Sister    ? Colon cancer Brother    ? Colon cancer Son        Social History     Socioeconomic History   ? Marital status: Widowed     Spouse name: Not on file   ? Number of children: Not on file   ? Years of education: Not on file   ? Highest education level: Not on file   Occupational History   ? Not on file   Social Needs   ? Financial resource strain: Not on file   ? Food insecurity     Worry: Not on file     Inability: Not on file   ? Transportation needs     Medical: Not on file     Non-medical: Not on file   Tobacco Use   ? Smoking status: Never Smoker   ? Smokeless tobacco: Never Used   Substance and Sexual Activity   ? Alcohol use: Not Currently ? Drug use: Never   ? Sexual activity: Not on file   Lifestyle   ? Physical activity     Days per week: Not on file     Minutes per session: Not on file   ? Stress: Not on file   Relationships   ? Social Wellsite geologist on phone: Not on file     Gets together: Not on file     Attends religious service: Not on file     Active member of club or organization: Not on file     Attends meetings of clubs or organizations: Not on file     Relationship status: Not on file   Other Topics Concern   ? Need Help Feeding Yourself? Not Asked   ? Need Help Getting Dressed? Not Asked   ? Need Help Using the Telephone? Not Asked   ? Need Help Managing Money? Tree surgeon, [employer]) Not Asked   ? Need Help Shopping for Groceries? Not Asked   ? Need Help Getting Places Beyond Walking Distance? [employer], [employer]) Not Asked   ? Need Help Getting from Bed to Chair? Not Asked   ? Need Help Bathing or Showering? Not Asked   ? Need Help Taking your Medications? Not Asked   ? Need Help Doing Moderately Strenuous Housework? (ex. Laundry) Not Asked   ? Need Help Driving? Not Asked   ? Need Help Getting to the Toilet? Not Asked   ? Need Help Walking Across the Road? (Includes [place]) Not Asked   ? Need Help Preparing Meals? Not Asked   ? Need Help Shopping for Personal Items? (Toiletries, Medicines) Not Asked   ? Need Help Climbing a Flight of Stairs? Not Asked   ? Do you live with someone who assists you at home? Not Asked   ? Do you get help from family members or friends in your home? Not Asked   ? Do you employ someone to provide health related care or  help you in your home? Not Asked   ? Do you provide care for a family member? Not Asked   ? Does your home have rugs in the hallway? Not Asked   ? Does your home have poor lighting? Not Asked   ? Does your home lack grab bars in the bathroom? Not Asked   ? Does your home lack handrails on the stairs? Not Asked   ? Have you noticed any hearing difficulties? Not Asked   ? Do you currently participate in any regular activity to improve or maintain your physical fitness? Not Asked   ? Do you always wear a seatbelt when you ride in a car? Not Asked   ? If you drink alcohol, do you drink more than 7 drinks per week or more than 3 drinks on any given day? Not Asked   ? Has anyone ever been concerned about your drinking? Not Asked   ? Do you exercise at least a day, 3 or more days a week? No   ? Types of Exercise? (List in Comments) No   ? Do you follow a special diet? No   ? Vegan? No   ? Vegetarian? No   ? Pescatarian? No   ? Lactose Free? No   ? Gluten Free? No   ? Omnivore? No   Social History Narrative   ? Not on file       Review of Systems: A 14-system review of systems was performed and is negative except as stated in the history of present illness.  Constitutional: negative for chills, fevers, night sweats and weight loss  ENT: negative bad breath  Eyes: negative discharge from eyes  Respiratory: negative for cough, dyspnea on exertion and wheezing  Cardiovascular: negative for chest pain, dyspnea, lower extremity edema and palpitations  GI: negative nausea, vomiting, diarrhea  GU: no urinary incontinence  MSK: negative muscle twitching  Integumentary: negative cracking of skin  Neurological: negative syncope or disorientation  Psychiatric: negative nervousness  Endocrine: negative excessive thirst  Heme/Lymph: negative enlarged lymph nodes  Allergic/Immunologic: negative hives       Objective:        BP 167/53  ~ Pulse 86  ~ Temp (!) 35.7 ?C (96.2 ?F)  ~ Ht 5' 1'' (1.549 m)  ~ Wt 112 lb (50.8 kg)  ~ BMI 21.16 kg/m?     General: thin in NAD, cooperative  Head: NC/AT  Eyes: EOMI, conjunctiva clear  Neck: supple, trachea midline  Lungs: CTAB, no w/r/r  Heart: RRR, S1 and S2 normal, no m/r/g  MSK: L shoulder w/o TTP  Neuro: Grossly intact  Psychiatric: answers appropriately, mood and affect WNLs    Lab Review:   Lab Visit on 05/03/2019   Component Date Value   ? Sodium 05/03/2019 139 ? Potassium 05/03/2019 3.5*   ? Chloride 05/03/2019 101    ? Total CO2 05/03/2019 24    ? Anion Gap 05/03/2019 14    ? Glucose 05/03/2019 79    ? Creatinine 05/03/2019 2.33*   ? GFR Estimate for African* 05/03/2019 22    ? GFR Estimate for Non-Afr* 05/03/2019 19    ? GFR Additional Informati* 05/03/2019 See Comment    ? Urea Nitrogen 05/03/2019 46*   ? Calcium 05/03/2019 9.6    ? White Blood Cell Count 05/03/2019 57.57*   ? Red Blood Cell Count 05/03/2019 3.46*   ? Hemoglobin 05/03/2019 9.5*   ? Hematocrit 05/03/2019 32.6*   ? Mean  Corpuscular Volume 05/03/2019 94.2    ? Mean Corpuscular Hemoglo* 05/03/2019 27.5    ? MCH Concentration 05/03/2019 29.1*   ? Red Cell Distribution Wi* 05/03/2019 79.7*   ? Red Cell Distribution Wi* 05/03/2019 24.1*   ? Platelet Count, Auto 05/03/2019 302    ? Mean Platelet Volume 05/03/2019     ? Nucleated RBC%, automated 05/03/2019 5.2    ? Absolute Nucleated RBC C* 05/03/2019 3.01*   ? Neutrophil Abs (Prelim) 05/03/2019 30.28    Office Visit on 05/03/2019   Component Date Value   ? Magnesium 05/03/2019 1.7    ? Hematocrit 05/03/2019 32.7*   Lab Visit on 04/30/2019   Component Date Value   ? Magnesium 04/30/2019 1.5    ? Albumin 04/30/2019 4.0    ? Prot/Creat Ratio,Ur 04/30/2019 1.1*   ? Protein,Urine 04/30/2019 107    ? Creatinine,Random Urine 04/30/2019 94.5    ? Iron 04/30/2019 50    ? Iron Binding Capacity 04/30/2019 213*   ? % Saturation 04/30/2019 23    ? Ferritin 04/30/2019 426*   ? Phosphorus 04/30/2019 2.9    ? Sodium 04/30/2019 136    ? Potassium 04/30/2019 3.4*   ? Chloride 04/30/2019 97    ? Total CO2 04/30/2019 22    ? Anion Gap 04/30/2019 17    ? Glucose 04/30/2019 83    ? Creatinine 04/30/2019 2.31*   ? GFR Estimate for African* 04/30/2019 22    ? GFR Estimate for Non-Afr* 04/30/2019 19    ? GFR Additional Informati* 04/30/2019 See Comment    ? Urea Nitrogen 04/30/2019 47*   ? Calcium 04/30/2019 9.3    ? PTH, Intact 04/30/2019 86*   ? Calcium 04/30/2019 9.1    ? Potassium,Random Urine 04/30/2019 49    Lab Visit on 04/19/2019   Component Date Value   ? Uric Acid 04/19/2019 11.9*   ? Magnesium 04/19/2019 1.6    ? Phosphorus 04/19/2019 4.1    ? Ionized Ca++,Uncorrected 04/19/2019 1.20    ? Ionized Ca++,Corrected 04/19/2019 1.14    ? Sodium 04/19/2019 139    ? Potassium 04/19/2019 3.8    ? Chloride 04/19/2019 101    ? Total CO2 04/19/2019 23    ? Anion Gap 04/19/2019 15    ? Glucose 04/19/2019 96    ? Creatinine 04/19/2019 2.06*   ? GFR Estimate for African* 04/19/2019 25    ? GFR Estimate for Non-Afr* 04/19/2019 22    ? GFR Additional Informati* 04/19/2019 See Comment    ? Urea Nitrogen 04/19/2019 47*   ? Calcium 04/19/2019 9.2    ? Prot/Creat Ratio,Ur 04/19/2019 1.4*   ? Protein,Urine 04/19/2019 76    ? Creatinine,Random Urine 04/19/2019 55.5    ? Sodium,Random Urine 04/19/2019 48    ? Urea Nitrogen,Random Uri* 04/19/2019 457    ? Uric Acid,Random Urine 04/19/2019 56.6    ? Urine Color 04/19/2019 Light-Yellow    ? Specific Gravity 04/19/2019 1.012    ? pH,Urine 04/19/2019 5.5    ? Blood 04/19/2019 Trace*   ? Bilirubin 04/19/2019 Negative    ? Ketones 04/19/2019 Negative    ? Glucose 04/19/2019 Negative    ? Protein 04/19/2019 1+*   ? Leukocyte Esterase 04/19/2019 Negative    ? Nitrite 04/19/2019 Negative    ? RBC per uL 04/19/2019 0    ? WBC per uL 04/19/2019 5    ? RBC per HPF 04/19/2019 0    ? WBC per HPF  04/19/2019 1    ? Bacteria 04/19/2019 Present*   ? Squamous Epi Cells 04/19/2019 0    ? Hyaline Casts 04/19/2019 3-5/LPF*   ? Granular Casts 04/19/2019 Present*   Office Visit on 03/29/2019   Component Date Value   ? Glucose, Serum ([facility]) 04/12/2019 85    ? BUN ([facility]) 04/12/2019 50*   ? Creatinine, Serum ([facility]* 04/12/2019 3.20*   ? eGFR If NonAfricn Am ([facility]* 04/12/2019 13*   ? eGFR If Africn Am ([facility]* 04/12/2019 15*   ? BUN/Creatinine Ratio ([facility]* 04/12/2019 16    ? Sodium, Serum ([facility]) 04/12/2019 138    ? Potassium, Serum ([facility]* 04/12/2019 4.2    ? Chloride, Serum ([facility]) 04/12/2019 100    ? Carbon Dioxide, Total (L* 04/12/2019 21    ? Calcium, Serum ([facility]) 04/12/2019 8.9    ? Magnesium, Serum ([facility]* 04/12/2019 1.9    ? Calcium, Serum ([facility]) 04/12/2019 9.2    ? PTH (Intact Assay), Seru* 04/12/2019 68*   ? Phosphorus, Serum ([facility]* 04/12/2019 4.7*   ? Ferritin, Serum ([facility]) 04/12/2019 566*   ? Iron Bind.Cap.(TIBC) ([facility]* 04/12/2019 237*   ? UIBC ([facility]) 04/12/2019 69*   ? Iron, Serum ([facility]) 04/12/2019 168*   ? Iron Saturation ([facility]) 04/12/2019 71*   ? Creatinine, Urine ([facility]* 04/12/2019 132.8    ? Protein,Total,Urine (Lab* 04/12/2019 158.3    ? Protein/Creat Ratio (Lab* 04/12/2019 1,192*   ? Albumin, Serum ([facility]) 04/12/2019 3.8    ? Hemoglobin ([facility]) 04/12/2019 9.9*   Lab Visit on 03/18/2019   Component Date Value   ? Citric Acid,urine mg/L 03/17/2019 43    ? Citric Acid,urine mg/24 * 03/17/2019 62*   ? Citric Acid/Crt ratio, u* 03/17/2019 134*   ? Hours Collected 03/17/2019 24    ? Total Urine Volume 03/17/2019 1450    ? Creatinine, Urine - per * 03/17/2019 32    ? Creatinine, Urine - per * 03/17/2019 464    ? Phosphorus,Timed Urine 03/17/2019     ? Phosphorus,Conc Urine 03/17/2019 <14    ? Hours Collected 03/17/2019 24    ? Total Volume 03/17/2019 1,450    ? Calcium,Timed,Ur 03/17/2019 61    ? Calcium,Conc ur 03/17/2019 4.2    ? Hours Collected 03/17/2019 24    ? Total Volume 03/17/2019 1,450    ? Creatinine, Conc, Urine 03/17/2019 33.7    ? Creatinine,Timed Urine 03/17/2019 489*   ? Hours Collected 03/17/2019 24    ? Total Volume 03/17/2019 1,450    ? Oxalate,urine 03/17/2019 26    ? Oxalate,urine random 03/17/2019 18    ? Hours Collected 03/17/2019 24    ? Total Urine Volume 03/17/2019 1450    ? Creatinine, Urine - per * 03/17/2019 34    ? Creatinine, Urine - per * 03/17/2019 493    ? Magnesium,Timed Urine 03/17/2019 3.3    ? Magnesium,Conc Urine 03/17/2019 2.3    ? Hours Collected 03/17/2019 24    ? Total Volume 03/17/2019 1,450    ? Uric Acid,Timed, Ur 03/17/2019 700    ? Uric Acid,Conc Urine 03/17/2019 48.3    ? Hours Collected 03/17/2019 24    ? Total Volume 03/17/2019 1,450    ? Sodium,Conc Urine 03/17/2019 78    ? Sodium,Timed,Urine 03/17/2019 113    ? Hours Collected 03/17/2019 24    ? Total Volume 03/17/2019 1,450       PROCEDURE NOTE:    After informed consent was obtained, the left shoulder was prepped  in a sterile fashion. Using a 23 gauge needle, a mixture of 40 milligrams Kenalog and 4 milliliters of 1% lidocaine was injected into the shoulder joint using the posterior approach.      The patient tolerated the procedure well.  Hemostasis obtained.    There were no immediate complications.       Post injection instructions were given and the patient will follow up as indicated.         Assessment & Plan:          85 y.o. old female  has a past medical history of CKD (chronic kidney disease), GERD (gastroesophageal reflux disease), History of CVA (cerebrovascular accident) (2011), History of ductal carcinoma in situ (DCIS) of breast, Hyperlipidemia, Hypertension, Hypothyroidism, Leukemia (HCC/RAF), and Myelodysplasia (myelodysplastic syndrome) (HCC/RAF). had concerns including Shoulder Injection (left )., p/w f/u shoulder pain.    1. Calcific tendonitis of left shoulder  Worse, pain not improved with tylenol #3.  R/B discussed, consent obtained.  -s/p CSI today  -no side effects  -after care instructions given  -continue with PT    #Routine Health Maintenance:  Health Maintenance Topics with due status: Completed       Topic Last Completion Date    COVID-19 Vaccine 03/24/2019       Health Maintenance Due   Topic Date Due   ? Tdap/Td Vaccine (1 - Tdap) Never done   ? Shingles (Shingrix) Vaccine (1 of 2) Never done   ? Annual Preventive Wellness Visit  Never done   ? Advance Directive  Never done   ? Pneumococcal Vaccine (1 of 2 - PCV13) Never done   ? Osteoporosis Early Detection DEXA Scan  Never done   ? Influenza Vaccine (1) 10/06/2018 Mammogram: No results found. n/a  Pap: n/a  Colon CA: 08/2018 polyp  Immunizations: Flu: n/a Tdap: per pt UTD Pneumo per pt UTD Zoster defer  DEXA: per pt neg  Hx of Smoking/Abd Ultrasound  (M>65yo any smoking)/annual CTscan (M/F >50yo 20pack year and smoked past 15 yrs): n/a  HCV: due next lab    Follow up:   Return if symptoms worsen or fail to improve.          Author:  Sharen Counter, MD 05/03/2019 4:41 PM      The above plan of care, diagnosis, orders, and follow-up were discussed with the patient.  Questions related to this recommended plan of care were answered.

## 2019-05-03 NOTE — Telephone Encounter
Reply by: Harle Stanford Sherrian Divers  I can see her at 4:30pm today

## 2019-05-03 NOTE — Progress Notes
OUTPATIENT RENAL CONSULTATION    Referring Physician: Sharen Counter., MD      Reason for Consultation:  Management of CKD    History of Present Illness:  85 y.o. African American female with CKD, HTN, MDS and hypothyroidism.     She lives in [place] but has moved to [facility] to stay with her daughter. She follows with her nephrologist,  hematologist and her PCP in [place].  She has history of long-standing hypertension.     Kidney disease was diagnosed about 4 years ago and was told it ''rapidly progressed''.  Per patient, about 4 years ago she underwent an invasive vascular study, which sounds like renal artery study and was told there was only some calcification and that the kidneys were not the cause of high blood pressure.  She also has history of MDS - diagnosed by bone marrow biopsy. She receives monthly Aranesp in clinic but she does not know the dose.   Renal US in June 2020 from outside record reviewed. Noted small stones <62mm in the right kidneys w/o hydronephrosis. Patient denies passing any stone or history of urologic intervention. No symptoms. She had one episode of gross hematuria in Sep 2020 and was told there was no infection. It resolved spontaneouslu. Did not have cold symptoms at that time. No pain. No dysuria. No feeling of incomplete bladder emptying. No foamy urine.  No diabetes.    Interval events  Last visit 03/29/19  No recent illness or hospitalization.    Dizzy and N/V for a couple of days. No spinning sensation.  C/o lightheadedness  Reports orthostatic symptoms    Cut down on diltiazem to 240 mg daily without improvement.  Home BP: 130-150/50s, HR WNL    Vomited once.  Eating well.   No problem with urinating.  No fever or chills.  No stomach pain. HA once a while.     Saw PCP for shoulder pain. Plan to give steroid injection.   PT scheduled. Now on lidocaine patch. Also prescribed APAP/codeine  Ambulates with a cane.      Review of Systems:  Besides what was mentioned above a 14-pt review of systems was obtained and found to be negative or normal.    Past Medical History:   Diagnosis Date   ? CKD (chronic kidney disease)    ? GERD (gastroesophageal reflux disease)    ? History of CVA (cerebrovascular accident) 2011   ? 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 Hyperlipidemia    ? Hypertension    ? Hypothyroidism    ? Leukemia (HCC/RAF)    ? Myelodysplasia (myelodysplastic syndrome) (HCC/RAF)        Past Surgical History:   Procedure Laterality Date   ? CESAREAN SECTION     ? MASTECTOMY Bilateral    ? TOTAL ABDOMINAL HYSTERECTOMY         Allergies   Allergen Reactions   ? Oxycodone-Acetaminophen Other (See Comments)   ? Penicillins Hives   ? Sulfa Antibiotics Agranulocytosis   ? Naproxen Rash     Aleve       No outpatient medications have been marked as taking for the 05/03/19 encounter (Appointment) with Sheron Nightingale, MD.     Current Facility-Administered Medications for the 05/03/19 encounter (Appointment) with Sheron Nightingale, MD   Medication   ? epoetin alfa-epbx (Retacrit) 10,000 units/mL inj 20,000 Units       Social History  Socioeconomic History   ? Marital status: Widowed     Spouse name: Not on file   ? Number of children: Not on file   ? Years of education: Not on file   ? Highest education level: Not on file   Occupational History   ? Not on file   Social Needs   ? Financial resource strain: Not on file   ? Food insecurity     Worry: Not on file     Inability: Not on file   ? Transportation needs     Medical: Not on file     Non-medical: Not on file   Tobacco Use   ? Smoking status: Never Smoker   ? Smokeless tobacco: Never Used   Substance and Sexual Activity   ? Alcohol use: Not Currently   ? Drug use: Never   ? Sexual activity: Not on file   Lifestyle   ? Physical activity     Days per week: Not on file     Minutes per session: Not on file   ? Stress: Not on file   Relationships   ? Social Wellsite geologist on phone: Not on file     Gets together: Not on file     Attends religious service: Not on file     Active member of club or organization: Not on file     Attends meetings of clubs or organizations: Not on file     Relationship status: Not on file   Other Topics Concern   ? Need Help Feeding Yourself? Not Asked   ? Need Help Getting Dressed? Not Asked   ? Need Help Using the Telephone? Not Asked   ? Need Help Managing Money? Tree surgeon, [employer]) Not Asked   ? Need Help Shopping for Groceries? Not Asked   ? Need Help Getting Places Beyond Walking Distance? [employer], [employer]) Not Asked   ? Need Help Getting from Bed to Chair? Not Asked   ? Need Help Bathing or Showering? Not Asked   ? Need Help Taking your Medications? Not Asked   ? Need Help Doing Moderately Strenuous Housework? (ex. Laundry) Not Asked   ? Need Help Driving? Not Asked   ? Need Help Getting to the Toilet? Not Asked   ? Need Help Walking Across the Road? (Includes [place]raim Hamburger) Not Asked   ? Need Help Preparing Meals? Not Asked   ? Need Help Shopping for Personal Items? (Toiletries, Medicines) Not Asked   ? Need Help Climbing a Flight of Stairs? Not Asked   ? Do you live with someone who assists you at home? Not Asked   ? Do you get help from family members or friends in your home? Not Asked   ? Do you employ someone to provide health related care or help you in your home? Not Asked   ? Do you provide care for a family member? Not Asked   ? Does your home have rugs in the hallway? Not Asked   ? Does your home have poor lighting? Not Asked   ? Does your home lack grab bars in the bathroom? Not Asked   ? Does your home lack handrails on the stairs? Not Asked   ? Have you noticed any hearing difficulties? Not Asked   ? Do you currently participate in any regular activity to improve or maintain your physical fitness? Not Asked   ? Do you always wear a seatbelt when  you ride in a car? Not Asked   ? If you drink alcohol, do you drink more than 7 drinks per week or more than 3 drinks on any given day? Not Asked   ? Has anyone ever been concerned about your drinking? Not Asked   ? Do you exercise at least a day, 3 or more days a week? No   ? Types of Exercise? (List in Comments) No   ? Do you follow a special diet? No   ? Vegan? No   ? Vegetarian? No   ? Pescatarian? No   ? Lactose Free? No   ? Gluten Free? No   ? Omnivore? No   Social History Narrative   ? Not on file       Family History   Problem Relation Age of Onset   ? Stroke Mother    ? Emphysema Father    ? Ovarian cancer Sister    ? Heart attack Brother    ? Lung cancer Sister    ? Lung cancer Sister    ? Colon cancer Brother    ? Colon cancer Son       Family history:  Sisters have kidney disease not on dialysis.    Physical Examination:  There were no vitals taken for this visit.  General: NAD   Head: Atraumatic, normocephalic   Eyes: EOMI, no conjunctival pallor, no scleral icterus   ENMT: clear oropharynx, moist oral mucosa, fair dentition   Neck: supple, no JVD, midline trachea   Cardiovascular: RRR, normal S1/S2, SEM grade 4 at LUSB  Respiratory: clear to auscultation, good air entry, no accessory muscle use   Abdomen: +BS soft, nontender, nondistended  Extremities: no lower extremity swelling. Warm.   Skin: No rashes. Nl turgor    Labs:    Lab Results   Component Value Date    WBC 60.8 (>) 01/18/2019    HGB 9.9 (L) 04/12/2019    HCT 26.6 (L) 01/18/2019    MCV 90 01/18/2019    PLT 226 01/18/2019       Lab Results   Component Value Date    FE 50 04/30/2019    TIBC 213 (L) 04/30/2019    FEBINDSAT 23 04/30/2019    FERRITIN 426 (H) 04/30/2019       Lab Results   Component Value Date    NA 136 04/30/2019    K 3.4 (L) 04/30/2019    CL 97 04/30/2019    CO2 22 04/30/2019    BUN 47 (H) 04/30/2019    CREAT 2.31 (H) 04/30/2019    GLUCOSE 83 04/30/2019       Lab Results   Component Value Date    CREAT 2.31 (H) 04/30/2019    CREAT 2.06 (H) 04/19/2019    CREAT 3.20 (H) 04/12/2019    CREAT 1.96 (H) 03/23/2019    CREAT 1.69 (H) 02/25/2019       Lab Results   Component Value Date    CALCIUM 9.3 04/30/2019    CALCIUM 9.1 04/30/2019    ICALCOR 1.14 04/19/2019    PHOS 2.9 04/30/2019    MG 1.5 04/30/2019    ALBUMIN 4.0 04/30/2019       Lab Results   Component Value Date    VITD25OH 51.1 01/18/2019    PTHINT 86 (H) 04/30/2019       Lab Results   Component Value Date    CHOL 77 (L) 01/18/2019    CHOLHDL 19 (L) 01/18/2019  TRIGLY 165 (H) 01/18/2019    NOHDLCHOCAL 28 01/18/2019       Lab Results   Component Value Date    PHVEN 7.41 03/01/2019    PCO2VEN 49 03/01/2019    PO2VEN 23 03/01/2019    BICARBVEN 30.4 03/01/2019    BEVEN 6 03/01/2019       No results found for: PHART, PCO2ART, PO2ART, BICARBART, BEART, O2SATART, FIO2ART    Lab Results   Component Value Date    SPECGRAVUR 1.012 04/19/2019    PHUR 5.5 04/19/2019    BLDUR Trace (A) 04/19/2019    BILIUR Negative 04/19/2019    KETONESUR Negative 04/19/2019    GLUCOSEUR Negative 04/19/2019    PROTCLUR 1+ (A) 04/19/2019    NITRITEUR Negative 04/19/2019    LEUKESTUR Negative 04/19/2019    RBCSUR 0 04/19/2019    WBCSUR 5 04/19/2019       Lab Results   Component Value Date    TPCREAT 1.1 (H) 04/30/2019    TPCREAT 1.4 (H) 04/19/2019    TPCREAT 1,192 (H) 04/12/2019       Lab Results   Component Value Date    NARANUR 48 04/19/2019    UNRANUR 457 04/19/2019    CREATRANUR 94.5 04/30/2019         Imaging:  US kidneys 07/2018 from outside record  Rt kidney 9.1 cm - multiple small non-obstructing stones visualized, largest 0.42 cm  Lt kidney 8.3 cm    Impression:  85 y.o.female with long-standing HTN, MDS, hypothyroidism here to establish care    Assessment:  #CKD: Stage 4/A3  - Secondary to hypertensive nephrosclerosis +/- secondary FSGS  - Baseline creatinine 1.8-2  - Small kidneys on renal US. Per patient, RAS was ruled out    # Proteinuria  - Proteinuria: Goal UPC < [phone] mg/day  - Not on RAASi due to advanced CKD  - Diltiazem    # Nephrolithiasis  - uric acid stone? Given uric acid crystals - 24-hr urine 03/01/19: volume 1.45 L, Cr 489 mg/day, Na 113 mmol/day, uric acid 700 mg/day (off allopurinol), Mg 3.3 mEq/day, oxalate 26 mg/day, [place] 61 mg/day, Phos <14, citrate 62 mg/day    #Hypertension- at goal BP < 130/80. Patient had lightheadedness after intensification of diltiazem dose. Maybe related to low DBP. Would target higher BP goal for now.  - clonidine 0.1 mg TID  - diltiazem decreased to 180 mg daily given dizziness/lightheadness.  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 Prior auth sent. EPO 20,000 U SC q2wk in the mean time - asked to contact hem/onc again if it is ok to continue EPO in this setting. If OK per hem/onc, will continue  - iron: ferritin 426, Tsat 23%  - ferric citrate as above for phosphorus and iron supplement     #Hyperlipidemia - CKD is an independent risk factor for CHD.    - on rosuvastatin 10 mg daily    #  Immunizations -    Influenza: Oct 2020   Pneumovax: ?   Prevnar 13: ?   Hepatitis B: not sure --> HBsAb neg --> will give a booster dose at next visit   COVID-19: 02/24/18    Immunization History   Administered Date(s) Administered   ? COVID-19, mRNA, LNP-S, PF, 100 mcg/0.5 mL Dose (Moderna) 02/25/2019, 03/24/2019   ? influenza, unspecified formulation 10/22/2010        #Transplant status - unlikely to be a candidate given advanced age    Summary:  - decrease diltiazem CD 180 mg daily given dizziness. Target higher BP goal for now  - asked patient to contact hem/onc in [place] to see if EPO is ok given high WBC and Dx of MDS/chronic leukemia  - repeat BMP and CBC General CKD Recommendations:  -Recommend 0.8g/kg of protein intake for estimated gfr <60.   -Avoid nephrotoxins including NSAIDs and chinese herbs  -renally dose all medications to estimated GFR  -If estimated GFR <30, avoid gadolinium exposure with MRI.  -avoid iodinated contrast, if possible.  If contrast is needed, please contact us for recommendations.    Return to clinic: 4-6 weeks with labs prior    Boonph[facility]hop Boonpheng  Nephrology Fellow                   Orders Placed This Encounter   ? Basic Metabolic Panel     Standing Status:   Future     Number of Occurrences:   1     Standing Expiration Date:   05/02/2020   ? Hemoglobin     Standing Status:   Future     Number of Occurrences:   1     Standing Expiration Date:   05/02/2020   ? Hemoglobin     Standing Status:   Future     Standing Expiration Date:   05/02/2020   ? Magnesium     Standing Status:   Future     Number of Occurrences:   1     Standing Expiration Date:   05/02/2020   ? CBC & Plt & Diff     Standing Status:   Future     Number of Occurrences:   1     Standing Expiration Date:   05/02/2020   ? CBC   ? Differential, Automated   ? dilTIAZem (CARDIZEM CD) 180 mg 24 hr capsule     Sig: Take 1 capsule (180 mg total) by mouth daily.     Dispense:  30 capsule     Refill:  3               Renal Attending    I saw and evaluated the patient. Discussed management with the housestaff. I reviewed the housestaff's notes and agreed with findings and plan of care that we formulated. Continue to provide CKD management.    Joselyn Arrow, MD  Clinical Professor of Medicine  Director, Consultative Nephrology  [name]

## 2019-05-03 NOTE — Telephone Encounter
Reply by: Harle Stanford Miriah Maruyama  No sorry I am booked at this point, I can place referral to [facility] if pt wants

## 2019-05-04 LAB — CBC: RED BLOOD CELL COUNT: 3.45 x10E6/uL — ABNORMAL LOW (ref 3.96–5.09)

## 2019-05-04 LAB — Differential, Manual: BAND NEUTROPHIL: 13 (ref 0.0–0.1)

## 2019-05-06 ENCOUNTER — Ambulatory Visit: Payer: MEDICARE

## 2019-05-06 ENCOUNTER — Telehealth: Payer: PRIVATE HEALTH INSURANCE

## 2019-05-06 NOTE — Telephone Encounter
[facility] lab calling regarding CBC & Diff:  Blast cell 4%

## 2019-05-09 ENCOUNTER — Ambulatory Visit: Payer: PRIVATE HEALTH INSURANCE

## 2019-05-09 ENCOUNTER — Ambulatory Visit: Payer: Commercial Managed Care - Pharmacy Benefit Manager

## 2019-05-09 DIAGNOSIS — R42 Dizziness and giddiness: Secondary | ICD-10-CM

## 2019-05-10 ENCOUNTER — Inpatient Hospital Stay
Admit: 2019-05-10 | Discharge: 2019-05-11 | Disposition: A | Discharge status: Other Acute Inpatient Hospital | Payer: MEDICARE | Source: Home / Self Care

## 2019-05-10 ENCOUNTER — Ambulatory Visit: Payer: Commercial Managed Care - Pharmacy Benefit Manager

## 2019-05-10 DIAGNOSIS — J9601 Acute respiratory failure with hypoxia: Secondary | ICD-10-CM

## 2019-05-10 DIAGNOSIS — D72829 Elevated white blood cell count, unspecified: Secondary | ICD-10-CM

## 2019-05-10 LAB — Blood Gases,venous: BASE EXCESS: -1 mmol/L (ref 7.33–7.43)

## 2019-05-10 LAB — TSH with reflex FT4, FT3: TSH: 5.2 u[IU]/mL — ABNORMAL HIGH (ref 0.3–4.7)

## 2019-05-10 LAB — Extra Gold Top

## 2019-05-10 LAB — Uric Acid
URIC ACID: 11.8 mg/dL — ABNORMAL HIGH (ref 2.9–7.0)
URIC ACID: 12.3 mg/dL — ABNORMAL HIGH (ref 2.9–7.0)
URIC ACID: 11 mg/dL — ABNORMAL HIGH (ref 2.9–7.0)

## 2019-05-10 LAB — UA,Dipstick: PH,URINE: 6 (ref 5.0–8.0)

## 2019-05-10 LAB — Magnesium: MAGNESIUM: 1.4 meq/L (ref 1.4–1.9)

## 2019-05-10 LAB — Extra Light Green Top

## 2019-05-10 LAB — CBC
ABSOLUTE NUCLEATED RBC COUNT: 1.37 10*3/uL — ABNORMAL HIGH (ref 0.00–0.00)
NEUTROPHILS ABS (PRELIM): 53.07 10*3/uL (ref 4.16–9.95)
RED BLOOD CELL COUNT: 3.23 x10E6/uL — ABNORMAL LOW (ref 3.96–5.09)
RED CELL DISTRIBUTION WIDTH-SD: 78 fL — ABNORMAL HIGH (ref 36.9–48.3)

## 2019-05-10 LAB — Free T4: FREE T4: 1.5 ng/dL (ref 0.8–1.7)

## 2019-05-10 LAB — Basic Metabolic Panel
CALCIUM: 9.6 mg/dL (ref 8.6–10.4)
GFR ESTIMATE FOR AFRICAN AMERICAN: 25 mL/min/{1.73_m2} (ref 7–22)
GFR ESTIMATE FOR AFRICAN AMERICAN: 25 mL/min/{1.73_m2} (ref 8–19)
GLUCOSE: 107 mg/dL — ABNORMAL HIGH (ref 65–99)

## 2019-05-10 LAB — Urea Nitrogen: UREA NITROGEN: 48 mg/dL — ABNORMAL HIGH (ref 7–22)

## 2019-05-10 LAB — Hepatic Funct Panel: TOTAL PROTEIN: 7.1 g/dL (ref 6.1–8.2)

## 2019-05-10 LAB — Transferrin: TRANSFERRIN: 155 mg/dL — ABNORMAL LOW (ref 198–386)

## 2019-05-10 LAB — Fibrinogen: FIBRINOGEN: 271 mg/dL (ref 235–490)

## 2019-05-10 LAB — Electrolyte Panel: CHLORIDE: 106 mmol/L (ref 96–106)

## 2019-05-10 LAB — CREATININE: CREATININE: 2.53 mg/dL — ABNORMAL HIGH (ref 0.60–1.30)

## 2019-05-10 LAB — Lactate Dehydrogenase
LACTATE DEHYDROGENASE: 1426 U/L — ABNORMAL HIGH (ref 125–256)
LACTATE DEHYDROGENASE: 1334 U/L — ABNORMAL HIGH (ref 125–256)

## 2019-05-10 LAB — Sodium,Random,Ur: SODIUM,RANDOM URINE: 92 mmol/L

## 2019-05-10 LAB — Phosphorus
PHOSPHORUS: 4.3 mg/dL (ref 2.3–4.4)
PHOSPHORUS: 4.1 mg/dL (ref 2.3–4.4)

## 2019-05-10 LAB — Creatinine,Random Urine: CREATININE,RANDOM URINE: 30.4 mg/dL

## 2019-05-10 LAB — Glucose, Whole Blood: GLUCOSE, WHOLE BLOOD: 101 mg/dL — ABNORMAL HIGH (ref 65–99)

## 2019-05-10 LAB — Extra Light Blue Top

## 2019-05-10 LAB — Make Smear & Hold

## 2019-05-10 LAB — COVID-19 PCR: COVID-19 PCR: NOT DETECTED

## 2019-05-10 LAB — Differential, Manual: LYMPHOCYTE: 2 (ref 0.0–0.0)

## 2019-05-10 LAB — BLOOD BANK HOLD SPECIMEN

## 2019-05-10 LAB — B-Type Natriuretic Peptide: BNP: 532 pg/mL — ABNORMAL HIGH (ref <100)

## 2019-05-10 LAB — Extra Red Top (Plastic)

## 2019-05-10 LAB — Iron & Iron Binding Capacity: IRON BINDING CAPACITY: 162 ug/dL — ABNORMAL LOW (ref 262–502)

## 2019-05-10 LAB — Ferritin: FERRITIN: 640 ng/mL — ABNORMAL HIGH (ref 8–180)

## 2019-05-10 LAB — D-Dimer: D-DIMER STAGO: 2.44 ug{FEU}/mL — ABNORMAL HIGH (ref <0.60)

## 2019-05-10 LAB — Calcium,Ionized: IONIZED CA++,UNCORRECTED: 1.16 mmol/L (ref 1.09–1.29)

## 2019-05-10 LAB — Troponin I: TROPONIN I: 0.05 ng/mL (ref <0.1)

## 2019-05-10 LAB — UA,Microscopic: WBCS: 11 {cells}/uL (ref 0–22)

## 2019-05-10 LAB — Prothrombin Time Panel: PROTHROMBIN TIME: 20.4 s — ABNORMAL HIGH (ref 11.5–14.4)

## 2019-05-10 LAB — Procalcitonin: PROCALCITONIN: 1.12 ug/L — ABNORMAL HIGH (ref <0.10)

## 2019-05-10 MED ORDER — POLYETHYLENE GLYCOL 3350 17 G PO PACK
17 g | Freq: Every day | ORAL | Status: AC
Start: 2019-05-10 — End: ?

## 2019-05-10 MED ORDER — ALLOPURINOL 100 MG PO TABS
200 mg | Freq: Three times a day (TID) | ORAL | Status: SS
Start: 2019-05-10 — End: 2019-05-14

## 2019-05-10 MED ORDER — PANTOPRAZOLE SODIUM 40 MG PO TBEC
40 mg | Freq: Every day | ORAL | Status: SS
Start: 2019-05-10 — End: 2019-05-14

## 2019-05-10 MED ORDER — HYDRALAZINE HCL 10 MG PO TABS
10 mg | Freq: Four times a day (QID) | ORAL | Status: SS
Start: 2019-05-10 — End: 2019-05-14

## 2019-05-10 MED ORDER — HEPARIN SODIUM (PORCINE) 5000 UNIT/ML IJ SOLN
5000 [IU] | Freq: Two times a day (BID) | SUBCUTANEOUS | Status: SS
Start: 2019-05-10 — End: 2019-05-14

## 2019-05-10 MED ORDER — BISACODYL 5 MG PO TBEC
5 mg | ORAL_TABLET | Freq: Every day | ORAL | 0 refills | 100.00 days | Status: SS | PRN
Start: 2019-05-10 — End: 2019-05-15
  Filled 2019-05-11: qty 100, 100d supply, fill #0

## 2019-05-10 MED ORDER — LACTATED RINGERS IV SOLN
50 mL/h | INTRAVENOUS | Status: SS
Start: 2019-05-10 — End: 2019-05-14

## 2019-05-10 MED ORDER — SENNOSIDES 8.6 MG PO TABS
1 | Freq: Every evening | ORAL | Status: SS | PRN
Start: 2019-05-10 — End: 2019-05-13

## 2019-05-10 MED ADMIN — LEVOTHYROXINE SODIUM 50 MCG PO TABS: 50 ug | ORAL | @ 11:00:00 | Stop: 2019-05-11 | NDC 51079044001

## 2019-05-10 MED ADMIN — POLYETHYLENE GLYCOL 3350 17 G PO PACK: 17 g | ORAL | @ 15:00:00 | Stop: 2019-05-11 | NDC 00904693186

## 2019-05-10 MED ADMIN — HYDRALAZINE HCL 10 MG PO TABS: 10 mg | ORAL | @ 21:00:00 | Stop: 2019-05-11 | NDC 00904644061

## 2019-05-10 MED ADMIN — HYDRALAZINE HCL 10 MG PO TABS: 10 mg | ORAL | @ 14:00:00 | Stop: 2019-05-10 | NDC 00904644061

## 2019-05-10 MED ADMIN — ROSUVASTATIN CALCIUM 10 MG PO TABS: 10 mg | ORAL | @ 11:00:00 | Stop: 2019-05-10 | NDC 31722088390

## 2019-05-10 MED ADMIN — ALLOPURINOL 100 MG PO TABS: 200 mg | ORAL | @ 15:00:00 | Stop: 2019-05-11 | NDC 00904704161

## 2019-05-10 MED ADMIN — LACTATED RINGERS IV SOLN: 100 mL/h | INTRAVENOUS | @ 17:00:00 | Stop: 2019-05-11

## 2019-05-10 MED ADMIN — ALLOPURINOL 100 MG PO TABS: 200 mg | ORAL | @ 19:00:00 | Stop: 2019-05-11 | NDC 00904657161

## 2019-05-10 MED ADMIN — FUROSEMIDE 10 MG/ML IJ SOLN: 20 mg | INTRAVENOUS | @ 21:00:00 | Stop: 2019-05-10 | NDC 36000028225

## 2019-05-10 MED ADMIN — LACTATED RINGERS IV SOLN: 75 mL/h | INTRAVENOUS | @ 15:00:00 | Stop: 2019-05-10 | NDC 00338011704

## 2019-05-10 MED ADMIN — HEPARIN SODIUM (PORCINE) 5000 UNIT/ML IJ SOLN: 5000 [IU] | SUBCUTANEOUS | @ 21:00:00 | Stop: 2019-05-11 | NDC 00641040037

## 2019-05-10 MED ADMIN — PANTOPRAZOLE SODIUM 40 MG PO TBEC: 40 mg | ORAL | @ 15:00:00 | Stop: 2019-05-11 | NDC 50268063911

## 2019-05-10 MED ADMIN — MIRTAZAPINE 15 MG PO TABS: 15 mg | ORAL | @ 11:00:00 | Stop: 2019-05-10 | NDC 00904651961

## 2019-05-10 MED ADMIN — FUROSEMIDE 10 MG/ML IJ SOLN: 20 mg | INTRAVENOUS | @ 14:00:00 | Stop: 2019-05-10 | NDC 36000028225

## 2019-05-10 NOTE — Interdisciplinary
Report given to the transport, update given to the daughter, still high BP, pt denies any chest pain or discomfort, no distress, 1 L NC sats 94 %, pt's daughter took pt's bag and belongings, pt has only cell phone which she has on her hand, pt is getting ready to leave via gurney

## 2019-05-10 NOTE — H&P
[facility] Medicine ~ Direct Care Service    History & Physical Note    Chief Complaint  Dizziness (for few weeks now. Recently prescribed Diltiazem for HTN and has been titrated down due to dizziness: hx of CVA, leukemia )    History of Present Illness  Lindsay Brennan is a 85 y.o. 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     Patient endorses feeling lightheaded whenever she goes from sitting or laying to standing. Her symptoms started several weeks ago immediately after she started diltiazem 300mg  daily on 03/29/19. She discussed her symptoms with her Nephrologist, Dr. Everett Graff, on 3/29, and her dose was decreased to 180mg  daily. This has resulted in an improvement of her symptoms, but they persist. She denies feeling dizzy while at rest. She endorses experiencing a fall approximately 1 week ago, but states it was mechanical (she endorses tripping over a speed bump), landing on her knees; she denies any headstrike.     Patient has been hoping to return to [place] this week, but when she mentioned to family that she had been dizzy recently, they checked her oxygen saturation. She remarks it was in the low-80's, prompting her to seek emergent care. She endorsed mild SOB when laying flat, which improves when supine. Endorses non-productive cough over the past two days. No fever/chills. Patient completed her COVID vaccination series; no known COVID contacts. She denies any lower extremity swelling. No nausea/vomiting/diarrhea.     ED Course:  Initial vitals: T 36.7, HR 70, BP 141/59, SpO2 99%. Labs notable for WBC 126.9k, Hgb 8.9, Bicarb 19, Cr 2.53 (baseline appears 2.0-2.3). CXR with bilateral lower lobe patchy consolidations, most suggestive of pulmonary edema versus pneumonia. No therapeutics given.     Review of Systems  A 14 review of systems was completed. Additional symptoms were otherwise negative and/or non-contributory, except as listed above.    Past Medical History:  Past Medical History:   Diagnosis Date   ? CKD (chronic kidney disease)    ? GERD (gastroesophageal reflux disease)    ? History of CVA (cerebrovascular accident) 2011   ? 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 Hyperlipidemia    ? Hypertension    ? Hypothyroidism    ? Leukemia (HCC/RAF)    ? Myelodysplasia (myelodysplastic syndrome) (HCC/RAF)        Past Surgical History:  Reviewed, non-contributory     Family History:  Reviewed, non-contributory     Social History:  Reviewed, non-contributory     Allergies  Oxycodone-acetaminophen, Penicillins, Sulfa antibiotics, and Naproxen    Vital Signs:  Temp:  [36.7 ?C (98.1 ?F)] 36.7 ?C (98.1 ?F)  Heart Rate:  [70-95] 70  Resp:  [16-20] 20  BP: (141-178)/(58-95) 141/59  NBP Mean:  [82-88] 82  SpO2:  [92 %-99 %] 99 %    Physical Examination:  Gen: in no apparent distress, cooperative, conversant  Eyes: sclera anicteric, conjunctiva noninjected, pupils equal in diameter  HENT: hearing intact grossly, MMM, no oropharyngeal erythema or exudate  CV: RRR, no murmurs appreciated, no LE edema bilaterally  Lung: normal work of breathing without accessory muscle use, CTAB without wheezes, rales, or rhonchi   GI: soft, nondistended, nontender to palpation, no hepatosplenomegaly  Lymph: no cervical, supraclavicular, or axillary LAD noted  Skin: no rashes, warm to touch  Psych: mood and affect congruent to context; oriented, cooperative, judgment and insight intact  Neuro: AOx3, PERRL, face symmetric, tongue midline, sensation intact in all distal extremities to light touch    Labs:  Reviewed    Imaging  Reviewed    Microbiology  Reviewed    Assessment and Plan  Lindsay Brennan is a 85 y.o. 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     #Lightheadness  Symptoms appears to be orthostatic in the setting of nifedipine. However, will work-up.   -Admit to Medicine, monitored level of care  -Hold nifedipine   -Obtain orthostats  -Fall precautions  -Hold off on additional fluids for now given pt is hypervolemic on exam   -PT/OT consult     #Acute hypoxemic respiratory failure  #Stage IV CKD   #Suspected AKI vs rapid progression of CKD   Suspect hypervolemia due to rapidly progressive renal insufficiency. BNP mildly elevated. However, will also work-up for possible infection given worsening leukocytosis.   -Consider renal consult in AM   -Hold off on diuresis overnight pending further work-up of AKI   -Obtain procalcitonin  -VBG ordered  -Follow-up UA, UNa, UCr   -Trend Cr daily  -Consider TTE in AM   -Supplemental O2; continous O2 monitoring    #Leukocytosis  #History of leukemia  WBC 127k on admission with 4% blasts. Appears significantly elevated compared to baseline. Unclear if patient has previously received treatment. Suspect reflective of malignancy rather than reactive given lack of fevers and other systemic symptoms.   -Hematology consulted; plan for repeat bone marrow biposy in AM   -Trend TLS labs (uric acid, phos, K) q12h  -Obtain DIC labs  -Follow-up hematology malignancy panel  -Follow-up peripheral blood flow cytometry    #Anemia: Hgb 8.9. Likely 2/2 renal disease. Received Epo treatment as outpatient  -Obtain Fe studies  -Trend CBC daily  -Verify frequency of epo dosing with Renal in AM    #Subclinical hypothyrodism: TSH 5.2 on admission  -Continue levothyroxine , on alternating days     #Mood issue:  -Continue mitrazapine    #HLD:   -Continue Crestor     Inpatient Checklist:     DVT prophylaxis: SCD's; hold pharmacologic ppx due to planned BMBx on 4/5     Code Status: Full Code  Contact:  Primary Emergency Contact: Lindsay Brennan    Disposition: Admit to Inpatient. I anticipate patient will be hospitalized for greater than two midnights.     I personally reviewed labs and imaging. A total of 70 minutes were spent with greater than 50% spent face-to-face counseling patient and/or coordinating care.    In addition to use usual >70 minutes spent on E/M services, an additional >40 minutes were spent reviewing patient's complex medical history    W. Hoyle Sauer, M.D.  Attending Hospitalist, [facility] Direct Care Service   Department of Internal Medicine  [facility]

## 2019-05-10 NOTE — Interdisciplinary
Pt is alert x 3, VSS, no distress or short of breath, RA sats 90-93 %, no fever, SR on the monitor, pt ambulatory using cane, steady gait, routine med given, pt denies any pain, continue monitor

## 2019-05-10 NOTE — Discharge Summary
Discharge Summary           Name: Lindsay Brennan MRN: [id number] DOB: Sep 23, 1934   Admit Date:   05/10/2019   D/C Date: 05/10/2019 LOS:  LOS: 0 days    AdmittingAttending No admitting provider for patient encounter. Discharge Attending Zonia Kief., MD    PCP Iran Planas, MD Discharge Provider Grayling Congress. Kizzie Ide, MD     Inpatient Treatment Team Treatment Team:   Team: RR DIRECT CARE HOSPITALIST  Team: MEDICINE - HOSPITALIST-1  Primary - Intern: Leane Para   Consults Hematology/Oncology   Outpatient Care Team No care team member to display     Admission Diagnosis (reason for admission) Discharge Diagnosis (conditions treated during hospitalization)   Dizziness # Acute hypoxemic respiratory failure, POA, resolved  # Hyperleukocytosis, POA  # AKI on CKD, POA, improving  # Orthostasis, POA     Disposition Discharge Condition   Transfer to [facility]    stable     HPI: Lindsay Brennan is a 85 y.o. 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   ?  Patient endorses feeling lightheaded whenever she goes from sitting or laying to standing. Her symptoms started several weeks ago immediately after she started diltiazem 300mg  daily on 03/29/19. She discussed her symptoms with her Nephrologist, Dr. Everett Graff, on 3/29, and her dose was decreased to 180mg  daily. This has resulted in an improvement of her symptoms, but they persist. She denies feeling dizzy while at rest. She endorses experiencing a fall approximately 1 week ago, but states it was mechanical (she endorses tripping over a speed bump), landing on her knees; she denies any headstrike.   ?  Patient has been hoping to return to [place] this week, but when she mentioned to family that she had been dizzy recently, they checked her oxygen saturation. She remarks it was in the low-80's, prompting her to seek emergent care. She endorsed mild SOB when laying flat, which improves when supine. Endorses non-productive cough over the past two days. No fever/chills. Patient completed her COVID vaccination series; no known COVID contacts. She denies any lower extremity swelling. No nausea/vomiting/diarrhea.     Hospital Course:  Patient arrived with dyspnea and notable labs including Hgb 8.9, Bicarb 19, Cr 2.53 (baseline appears 2.0-2.3). Her CXR showed bilateral lower lobe patchy consolidations, most suggestive of pulmonary edema versus pneumonia. She denied any fevers, chills, nausea, or cough, thus antibiotics were deferred given low suspicion for bacterial infection. COVID negative. Her lightheadedness was evident upon standing, however orthostatics were unremarkable. Off her hypertensive medications had an episode of hypertension that resolved with lasix 20 mg and hydralazine 10mg . 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 Patient deferred bone marrow biopsy. She was started on IV fluids and allopurinol for tumor lysis prevention. Her 2L nasal canula was weaned and her creatinine down trended, and she was amenable to be transferred to [facility] for further care.    Discharge plan by problem:      #Lightheadness  Symptoms appears to be orthostatic in the setting of nifedipine. However, orthostatics unremarkable and hypervolemic on exam. Query secondary to exacerbation of CML leukocytosis.  -Hold home diltiazem for now pending resolution of lightheadedness   -Hold home clonidine 0.1mg  po tid  -Hydralazine 10 mg po Q6H  -  Fall precautions  -PT/OT during admission  ?  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  BNP mildly elevated. Low concern for pulmonary infection given clinical syndrome. On room air at time of transfer  -Trend Cr daily, no indications for HD at this time  -S/p Lasix 20mg  IV x1, continue prn volume overload  ?  #Hyperleukocytosis  #History of leukemia  WBC 127k on admission with 4% blasts. She has been living at WBC 60's chronically without previous intervention, unclear formal diagnosis (Atypical CML vs CMML). Appears significantly elevated compared to baseline. Her PBS does not suggest blast crisis, but without bone marrow biopsy we cannot know for sure if she has blasts in her marrow but suspicion is low. Suspect reflective of malignancy rather than reactive to infection given lack of fevers and other systemic symptoms. No indication currently for cytoreduction  -Trend TLS labs q6h  -Follow up BCR-ABL  -Continue IVF @ 50cc/hr for TLS prevention  -Started allopurinol 200mg  q8h   -Can diurese with lasix if progressively hypoxic  -Follow-up hematology malignancy panel  -Patient declined BMBx  -Heme/Onc consult, appreciate recs  -Outpatient oncologist Dr. Hubert Azure in [place] [phone]  ?  #Anemia: Hgb 8.9. Likely 2/2 renal disease and chronic heme disorder. Received Epo treatment as outpatient. Iron replete on admission labs  -Trend CBC  -Continue EPO as outpatient  ?  #Subclinical hypothyrodism: TSH 5.2 on admission  -Continue levothyroxine , on alternating days   ?  #Mood issue:  -Off mirtazapine per patient  ?  #HLD:   -Continue Crestor 20mg  qHS    Current Hospital Problems           POA    Dizziness Yes          I have seen and examined the patient and agree with the RD assessment detailed below:  Patient meets criteria for:      (current weight 49.4 kg (109 lb), BMI (Calculated): 20.6;  ,  ). See RD notes for additional details.    Procedures & Operations Performed: N/A    Relevant Imaging Studies (most recent results only):     Last CXR (05/09/2019):   IMPRESSION:  Stable cardiomediastinal silhouette with mildly tortuous, calcified thoracic aorta. Persistent mild cardiomegaly.  Normal lung volumes. Increased density of the bilateral lower lung zones is accentuated due to overlying breast implants.  There are bilateral lower lobe patchy consolidations, most suggestive of pulmonary edema versus pneumonia.   No right pleural effusion. Trace left pleural effusion. No pneumothorax. Elevation of the left hemidiaphragm  No acute osseous abnormalities.  Dierdre Highman, M.D.,     Relevant labs:   BMP:   Lab Results   Component Value Date/Time    NA 138 05/10/2019 07:25 AM    K 4.0 05/10/2019 07:25 AM    CL 102 05/10/2019 07:25 AM    CO2 21 05/10/2019 07:25 AM    BUN 45 (H) 05/10/2019 07:25 AM    CREAT 2.09 (H) 05/10/2019 07:25 AM    CALCIUM 9.3 05/10/2019 07:25 AM    GLUCOSE 78 05/10/2019 07:25 AM       CBC:   Lab Results   Component Value Date/Time    WBC 103.81 (H) 05/10/2019 03:43 AM    HGB 8.1 (L) 05/10/2019 03:43 AM    HCT 26.7 (L) 05/10/2019 03:43 AM  PLT 202 05/10/2019 03:43 AM        Other:   Last Cal/iCalMg/Ph:   Lab Results   Component Value Date/Time    CALCIUM 9.3 05/10/2019 07:25 AM    ICALCOR 1.15 05/10/2019 09:05 AM    MG 1.4 05/10/2019 07:25 AM    PHOS 4.3 05/10/2019 07:25 AM     Last Coags:   Lab Results   Component Value Date/Time    PT 20.4 (H) 05/10/2019 03:43 AM    INR 1.9 05/10/2019 03:43 AM     Last Troponin:   Lab Results   Component Value Date/Time    TROPONIN 0.05 05/09/2019 08:23 PM     Last VBG:   Lab Results   Component Value Date/Time    PHVEN 7.38 05/10/2019 07:28 AM    PCO2VEN 40 05/10/2019 07:28 AM    PO2VEN 49 05/10/2019 07:28 AM    BICARBVEN 23.0 05/10/2019 07:28 AM    BEVEN -1 05/10/2019 07:28 AM       Physical Exam: BP 166/66  ~ Pulse 83  ~ Temp 36.8 ?C (98.2 ?F) (Oral)  ~ Resp 24  ~ Ht 1.549 m (5' 1'')  ~ Wt 49.4 kg (109 lb)  ~ SpO2 95%  ~ BMI 20.60 kg/m?     Gen: in no apparent distress, cooperative, conversant  Eyes: sclera anicteric, conjunctiva noninjected, pupils equal in diameter HENT: hearing intact grossly, MMM, no oropharyngeal erythema or exudate  CV: RRR, no murmurs appreciated, no LE edema bilaterally  Lung: normal work of breathing without accessory muscle use, CTAB without wheezes, rales, or rhonchi   GI: soft, nondistended, nontender to palpation, no hepatosplenomegaly  Lymph: no cervical, supraclavicular, or axillary LAD noted  Skin: no rashes, warm to touch  Psych: mood and affect congruent to context; oriented, cooperative, judgment and insight intact  Neuro: AOx3, PERRL, face symmetric, tongue midline, sensation intact in all distal extremities to light touch      Outpatient Provider To-Do List   Please see detailed discharge plan by problem as above.      Pending labs   Unresulted Labs (From admission, onward)    None           Discharge medications High-risk medications      Medication List      ASK your doctor about these medications    acetaminophen 500 mg tablet  Commonly known as: Tylenol     acetaminophen-codeine 300-30 mg tablet  Commonly known as: Tylenol #3  Take 0.5 tablets by mouth every six (6) hours as needed for Severe Pain (Pain Scale 7-10). Max Daily Amount: 2 tablets     aspirin 81 mg EC tablet     cloNIDine 0.1 mg tablet  Commonly known as: Catapres     darbepoetin alfa 100 mcg/mL Soln injection  Commonly known as: Aranesp  Inject 1 mL (100 mcg total) under the skin every thirty (30) days.     dilTIAZem 180 mg 24 hr capsule  Commonly known as: Cardizem CD  Take 1 capsule (180 mg total) by mouth daily.     epoetin alfa-epbx 10,000 units/mL injection  Commonly known as: Retacrit     esomeprazole 40 mg capsule  Commonly known as: NexIUM     Ferric Citrate 1 GM 210 MG(Fe) Tabs  Take 1 tablet by mouth three (3) times daily with meals.     gabapentin 100 mg capsule  Commonly known as: Neurontin     lidocaine 5% patch  Commonly known  as: Lidoderm  Place 1 patch onto the skin every twelve (12) hours 12 hours on 12 hours off..     magnesium oxide 400 mg tablet  Take 1 tablet (400 mg total) by mouth daily.     mirtazapine 15 mg tablet  Commonly known as: Remeron     potassium chloride 10 mEq CR capsule     * rosuvastatin 10 mg tablet  Commonly known as: Crestor     * rosuvastatin 20 mg tablet  Commonly known as: Crestor     sodium bicarbonate 650 mg tablet  Take 1 tablet (650 mg total) by mouth two (2) times daily.     * SYNTHROID 50 mcg tablet  Generic drug: levothyroxine     * SYNTHROID 75 mcg tablet  Generic drug: levothyroxine         * This list has 4 medication(s) that are the same as other medications prescribed for you. Read the directions carefully, and ask your doctor or other care provider to review them with you.             This patient does not have coumadin on their discharge med list    Controlled meds:   Controlled Medications     Opioid Combinations Disp Start End     acetaminophen-codeine (TYLENOL #3) 300-30 mg tablet    20 tablet 04/26/2019     Sig - Route: Take 0.5 tablets by mouth every six (6) hours as needed for Severe Pain (Pain Scale 7-10). Max Daily Amount: 2 tablets - Oral           Nutrition recommendations & malnutrition assessment   The patient was not consulted or assessed by a Registered Dietitian (RD) during this admission.         Discharge diet orders   There are no active discharge diet orders for this encounter.      Rehab assessment   No PT evaluation this encounter             Discharge activity orders   There are no active discharge activity orders for this encounter.      Requested appointments Scheduled appointments   There are no active discharge follow-up orders for this encounter.  No future appointments.     Case Manager/Home Health assessment     Home Health orders   There are no active discharge home health orders for this encounter.      Goals of Care Note (if new for this encounter)         LACE+ Risk Stratification    Total Score Risk Stratification   0-28 Minimal Risk   29-58 Moderate Risk   59-78 High Risk   79-90 Highest Risk Readmission Score            65       Total Score        15 Urgent Admission    3 ED Visits in Previous 6 Months    47 Charlson Score (by age & number of urgent admissions)        Criteria that do not apply:    Female Patient    Discharge Institution    Length of Stay    Alternative Level of Care Status    Elective Admission in Previous Year          I have seen and examined the patient on their discharge day. I have reviewed, edited, and agree with the above discharge summary.  Modena Slater MSIV  [phone]     Attending Addendum    I have interviewed and examined the patient with the resident. All  labs, studies, and medications were independently reviewed by me. I have reviewed the resident note and agree with the history, physical, assessment and plan which we formulated together.    Grayling Congress. Kizzie Ide, MD  Clinical Instructor, Internal Medicine  Pager [phone]

## 2019-05-10 NOTE — Consults
Oncology Consult Note.     HPI: 85 year old female with stage IV CKD 2/2 HTN nephrosclerosis and FSGS, MPN/MDS (unknown subtype) on EPO presenting with lightheadedness and dyspnea for the past 4 weeks. Per the patient, lightheadedness has time correlation with diltiazem, improving with decrease in the dosage, and only occurring when she rises from seated position. She was planning to go back home to [place] but mentioned dizziness to her family who reportedly found her O2 saturation to be 80's at home, prompting admission. She says she only has dyspnea when lying flat, but improves in supine. She also endorses cough for the past 2 days. COVID negative.     Her oncologic diagnosis is unclear. She has had leukocytosis in the 60's chronically. She has diagnosis of MDS and CML but query atypical CML. She has not been on any previous treatments, but endorses being on EPO once per month. She reports having a bone marrow performed 3 months ago and was told she had chronic leukemia. Her oncologist is Dr. Hubert Azure from [facility] [place] [phone].     Interval Events: She endorses feeling well today. Dizziness resolved. She is not requiring oxygen. She does not report dyspnea.     Review of Systems  A 14 review of systems was completed. Additional symptoms were otherwise negative and/or non-contributory, except as listed above.  ?  Past Medical History:       Past Medical History:   Diagnosis Date   ? CKD (chronic kidney disease) ?   ? GERD (gastroesophageal reflux disease) ?   ? History of CVA (cerebrovascular accident) 2011   ? History of ductal carcinoma in situ (DCIS) of breast ?   ? s/p b/l mastectomy   ? Hyperlipidemia ?   ? Hypertension ?   ? Hypothyroidism ?   ? Leukemia (HCC/RAF) ?   ? Myelodysplasia (myelodysplastic syndrome) (HCC/RAF) ?   ?  ?  Past Surgical History:  Reviewed, non-contributory   ?  Family History:  Reviewed, non-contributory   ?  Social History:  Reviewed, non-contributory   ? Allergies  Oxycodone-acetaminophen, Penicillins, Sulfa antibiotics, and Naproxen    Last Recorded Vital Signs:    05/10/19 0900   BP:    Pulse: 82   Resp: (!) 25   Temp:    SpO2: 94%     Gen: Well developed, well nourished. Well-appearing. No acute distress.  Eyes: Normal Conjunctiva, sclera anicteric. EOMI.   ENT: External ears normal. External nose normal.   Chest wall: Without deformity. Respiration non labored  Abdomen: soft, non-tender, spleen palpable 2-3 cm below costal margin  Neuro: A+Ox3. Gait normal.   Skin: Warm and dry. No rashes or lesions seen on exposed areas  Psych: Appropriate mood and affect. Alert and oriented.    Recent Labs     05/10/19  0343 05/09/19  2023   WBC 103.81* 126.90*   HGB 8.1* 8.9*   HCT 26.7* 30.0*   MCV 91.8 92.9   PLT 202 235     Recent Labs     05/10/19  0905 05/10/19  0725 05/09/19  2023   NA  --  138 137   K  --  4.0 4.3   CL  --  102 106   CO2  --  21 19*   BUN  --  45* 48*   CREAT  --  2.09* 2.53*   MG  --  1.4  --    CALCIUM  --  9.3  --    ICALCOR 1.15  --   --      Recent Labs     05/10/19  0343   ALT 14   AST 32   BILITOT 0.6   ALKPHOS 145*   ALBUMIN 3.5*     Chest XR:  Stable cardiomediastinal silhouette with mildly tortuous, calcified thoracic aorta. Persistent mild cardiomegaly. Normal lung volumes. Increased density of the bilateral lower lung zones is accentuated due to overlying breast implants.There are bilateral lower lobe patchy consolidations, most suggestive of pulmonary edema versus pneumonia.No right pleural effusion. Trace left pleural effusion. No pneumothorax. Elevation of the left hemidiaphragm  No acute osseous abnormalities.    Peripheral smear reviewed: increased left shift, basophilia, dysplastic neutrophils noted, very few blasts.    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     She has been living at WBC 60's chronically without intervention, on admission WBC 120's but has down-trended to 100's. This could be reactive in the setting of pulmonary infxn that has since been treated with antibiotics. Her PB does not suggest blast crisis, but without bone marrow biopsy we cannot know for sure if she has blasts in her marrow but suspicion is low. Symptoms of dizziness/dyspnea appear to be better explained by diltiazem and PNA respectively. No indication currently for cytoreduction.    - discussed bone marrow biopsy with the patient to confirm diagnosis, she insists that her oncologist Dr. Silvio Clayman in [place] perform this when she returns to [place].   - attempted to call Dr. Silvio Clayman, will try again to formalize diagnosis.  - continue addressing goals of care with the patient  - please check tls labs q6h, start allopurinol, IVF with prn diuresis)  - follow up flow cytometry, heme malignancy panel, bcr-abl  - continue dic labs, add on PTT  - agree with broad spectrum antibiotics     We will continue to follow.     Janeth Rase MD  Hematology Oncology PGY-5    TBDWA Dr. Mariane Duval

## 2019-05-10 NOTE — Progress Notes
Pharmaceutical Services  Medication Reconciliation Note - ED    Patient Name: Lindsay Brennan  Medical Record Number: [id number]  Admit date: 05/10/2019 2:34 AM    Age: 85 y.o.  Sex: female  Allergies: Oxycodone-acetaminophen, Penicillins, Sulfa antibiotics, and Naproxen  Height:   Most recent documented height   05/09/19 1.549 m (5' 1'')     Actual Weight:   Most recent documented weight   05/10/19 49.4 kg   05/03/19 50.8 kg     Diagnosis: The patient is currently admitted with the following concerns/issues: Active Problems:    Dizziness POA: Yes      Reported Medication History   I spoke with the patient and spoke with family (daughter) at bedside to update the home medication list for this hospital admission. Daughter stated Darbepoetin requires prior authorization and the patient had not received that injection since January or February. As a replacement the patient received Epoetin. However, only received one dose of Epoetin on 04/19/2019.     PTA Medication List (discrepancies are noted)   Prior to Admission medications as of 05/10/19 1234   Medication Sig Notes Last Dose   acetaminophen 500 mg tablet 500 mg, Oral, Every 6 hours PRN for pain      acetaminophen-codeine (TYLENOL #3) 300-30 mg tablet 0.5 tablets, Oral, Every 6 hours PRN      aspirin 81 mg EC tablet 81 mg, Oral, Daily      cloNIDine 0.1 mg tablet 0.1 mg, Oral, 3 times daily      darbepoetin alfa 100 mcg/mL SOLN injection 100 mcg, Subcutaneous, Every 30 days Prior Authorization Pending    dilTIAZem (CARDIZEM CD) 180 mg 24 hr capsule 180 mg, Oral, Daily      epoetin alfa-epbx (RETACRIT) 10,000 units/mL injection 20,000 Units, Subcutaneous, twice weekly Received in place of Darbeopetin while PA is pending 04/19/2019   esomeprazole 40 mg capsule 40 mg, Oral, Daily      Ferric Citrate 1 GM 210 MG(Fe) TABS 1 tablet, Oral, 3 times daily with meals      gabapentin 100 mg capsule 200 mg, Oral, Every night at bedtime      lidocaine 5% patch 1 patch, Transdermal, Every 12 hours, 12 hours on 12 hours off. Reports only applying as needed for pain    magnesium oxide 400 mg tablet 400 mg, Oral, Daily      potassium chloride 10 mEq CR capsule 10 mEq, Oral, Daily      rosuvastatin 20 mg tablet 20 mg, Oral, Every night at bedtime      sodium bicarbonate 650 mg tablet 650 mg, Oral, 2 times daily      SYNTHROID 50 mcg tablet 50 mcg, Oral, Every other day, Pt alternates with 75mg       SYNTHROID 75 mcg tablet 75 mcg, Oral, Every other day, Pt alternates with 50mg         The patient's medications have been reviewed, and the PTA med list is updated.       Wylene Simmer, 05/10/2019, 12:35 PM    ___________________________________________________________________________________________________________________    I reviewed the prior to admission medication list compiled by the medication reconciliation pharmacy technician and attest to the above.     The reconciliation of admission orders with the prior to admission medication list is complete.     Paged team to inform them med rec completed and to address these discrepancies:    Updated home med list to include these home meds: clonidine 0.1 mg TID, Epogen  Pt not on mirtazapine at home but currently ordered - consider stopping if appropriate  Home Crestor 20 mg qday, currently ordered as 10 mg qday   [id number] Jodi Geralds MD ((([facility] INTERNAL MEDICINE       Teresita Madura 05/10/2019 1:26 PM   Transitions of Care Pharmacist  [facility]  [address]  [place], [place] [address]  Phone: [phone]

## 2019-05-10 NOTE — Consults
05/10/2019 10:05 AM    ED CASE MANAGER NOTES        TRANSFER       TRANSFER LOCATION  [x]  [facility]  []  Outside Hospital     10:05 AM  RR currently on [facility] , Patient identified by IP team as transferable.  Spoke to patient agreed to transfer, Transfer process was discussed with patient , was provided the opportunity to ask questions ,  verbalized no concerns at this time. CWA informed, initiated via Community education officer.         Lorette Ang RN  ED Case Manager   Office: [phone] or [phone]  Fax: [phone]  Pager ID: [phone]

## 2019-05-10 NOTE — ED Provider Notes
[name]  Emergency Department Service Report    Triage     Lindsay Brennan, a 85 y.o. female, presents with Dizziness (for few weeks now. Recently prescribed Diltiazem for HTN and has been titrated down due to dizziness: hx of CVA, leukemia )    Arrived on 05/09/2019 at 6:03 PM   Arrived by Walk-in [[id number]]    ED Triage Vitals   Temp Temp Source BP Heart Rate Resp SpO2 O2 Device Pain Score Weight   05/09/19 1805 05/09/19 1805 05/09/19 1806 05/09/19 1805 05/09/19 1805 05/09/19 1805 05/09/19 1805 -- 05/09/19 1812   36.7 ?C (98.1 ?F) Oral 178/70 95 18 94 % None (Room air)  50.8 kg (112 lb)       Allergies   Allergen Reactions   ? Oxycodone-Acetaminophen Other (See Comments)   ? Penicillins Hives   ? Sulfa Antibiotics Agranulocytosis   ? Naproxen Rash     Aleve        Initial Physician Contact       Comprehensive Exam Initiated  Contact Date: 05/09/19  Contact Time: 2025    History   HPI   85 y.o. African American female with CKD, HTN, HLD, MDS and hypothyroidism, presenting for dizziness.  Reports dizziness present for the past 3-4 weeks, experienced when standing, described as had ?wooziness?Marland Kitchen  Not experience while sitting down.  Usually at [facility], 2 miles per day, now has to use cane secondary to dizziness. Decreased exercise tolerance past few months. Seen at [facility] today, referred to ED for cardiac workup despite no chest pain.  Denies headache, visual disturbance, hearing loss, tinnitus, focal weakness or numbness.  Prior history of CVA no residual deficits, compliant w/ daily ASA.  Followed by [facility] nephrology for hypertension.  Recently decreased dose of diltiazem from 300 to180 mg daily, reports mild improvement in symptoms following decrease.  Denies fever, chills, cough, chest pain, shortness of breath, abdominal pain 1 episode of nausea and vomiting 1 week prior, not currently nauseous.  Denies dysuria. Dark stools in setting of iron rx.    Past Medical History:   Diagnosis Date   ? CKD (chronic kidney disease)    ? GERD (gastroesophageal reflux disease)    ? History of CVA (cerebrovascular accident) 2011   ? 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 Hyperlipidemia    ? Hypertension    ? Hypothyroidism    ? Leukemia (HCC/RAF)    ? Myelodysplasia (myelodysplastic syndrome) (HCC/RAF)         Past Surgical History:   Procedure Laterality Date   ? CESAREAN SECTION     ? MASTECTOMY Bilateral    ? TOTAL ABDOMINAL HYSTERECTOMY          Past Family History   family history includes Colon cancer in her brother and son; Emphysema in her father; Heart attack in her brother; Lung cancer in her sister and sister; Ovarian cancer in her sister; Stroke in her mother.     Past Social History   she reports that she has never smoked. She has never used smokeless tobacco. She reports previous alcohol use. She reports that she does not use drugs. No history on file for sexual activity.     Review of Systems   Constitutional: Negative for chills, diaphoresis and fever.   HENT: Negative for hearing loss, rhinorrhea, sore throat and tinnitus.    Eyes: Negative for visual disturbance.  Respiratory: Negative for cough and shortness of breath.    Cardiovascular: Negative for chest pain.   Gastrointestinal: Negative for abdominal pain, diarrhea, nausea and vomiting.   Genitourinary: Negative for dysuria and flank pain.   Musculoskeletal: Negative for neck pain.   Skin: Negative for color change.   Neurological: Positive for dizziness and light-headedness. Negative for syncope, weakness, numbness and headaches.   Psychiatric/Behavioral: Negative for behavioral problems.       Physical Exam   Physical Exam  Vitals signs reviewed.   Constitutional:       General: She is not in acute distress.     Appearance: She is normal weight. She is not toxic-appearing or diaphoretic.   HENT:      Head: Normocephalic and atraumatic.   Eyes:      General:         Right eye: No discharge.         Left eye: No discharge.      Extraocular Movements: Extraocular movements intact.      Conjunctiva/sclera: Conjunctivae normal.      Pupils: Pupils are equal, round, and reactive to light.      Comments: L pupil reactive, located medial/superior in comparison to R   Neck:      Musculoskeletal: Neck supple.   Cardiovascular:      Rate and Rhythm: Normal rate and regular rhythm.      Pulses: Normal pulses.      Heart sounds: Murmur (sys ejection murmur) present.   Pulmonary:      Effort: Pulmonary effort is normal. No respiratory distress.      Breath sounds: Normal breath sounds. No stridor. No wheezing, rhonchi or rales.   Abdominal:      General: Abdomen is flat. There is no distension.      Palpations: Abdomen is soft. There is no mass.      Tenderness: There is no abdominal tenderness. There is no guarding or rebound.   Musculoskeletal:         General: No tenderness.      Right lower leg: No edema.      Left lower leg: No edema.   Skin:     General: Skin is warm and dry.   Neurological:      General: No focal deficit present.      Mental Status: She is alert and oriented to person, place, and time. Mental status is at baseline.      Cranial Nerves: No cranial nerve deficit.      Sensory: No sensory deficit.      Motor: No weakness.      Gait: Gait normal.      Deep Tendon Reflexes: Reflexes normal.      Comments: Dix-hallpike negative   Psychiatric:         Mood and Affect: Mood normal.           Medical Decision Making   Lindsay Brennan is a 85 y.o. female  with CKD, HTN, HLD, MDS and hypothyroidism, presenting for dizziness most concerning for orthotstatic hypotension, given onset w/ standing. Query medication side effect, as mild improvement in symptoms following decrease in diltiazem rx. Doubt CVA/ICH, no HA, no neuro deficit, neuro exam reassuring. Doubt BPPV, dix-hallpike negative, no hearing changes doubt meniere's, no recent URI sx, doubt labyrinthitis. POC glucose wnl. Infection considered though no clear localizing sx. Hx of anemia though baseline.    -CBC, BMP, UA  -ECG  -orthostatic hypotension    Chart  Review   Previous medical records requested.  Pertinent items reviewed:     ED Course      Laboratory Results     Labs Reviewed   UA,DIPSTICK - Abnormal; Notable for the following components:       Result Value    Blood 1+ (*)     Protein 1+ (*)     All other components within normal limits   UA,MICROSCOPIC - Abnormal; Notable for the following components:    RBC per uL 12 (*)     RBC per HPF 3 (*)     All other components within normal limits   ELECTROLYTE PANEL - Abnormal; Notable for the following components:    Total CO2 19 (*)     All other components within normal limits   UREA NITROGEN - Abnormal; Notable for the following components:    Urea Nitrogen 48 (*)     All other components within normal limits   CREATININE,WHOLE BLOOD - Abnormal; Notable for the following components:    Creatinine 2.53 (*)     All other components within normal limits   GLUCOSE - Abnormal; Notable for the following components:    Glucose 101 (*)     All other components within normal limits   CBC (PERFORMABLE) - Abnormal; Notable for the following components:    Red Blood Cell Count 3.23 (*)     Hemoglobin 8.9 (*)     Hematocrit 30.0 (*)     MCH Concentration 29.7 (*)     Red Cell Distribution Width-SD 78.0 (*)     Red Cell Distribution Width-CV 23.8 (*)     All other components within normal limits   URINALYSIS W/REFLEX TO CULTURE    Narrative:     The following orders were created for panel order Urinalysis w/Reflex to Culture (ED Abdominal Pain/Nausea/Vomiting: Nurse Protocol).  Procedure                               Abnormality         Status                     ---------                               -----------         ------                     UA,Dipstick[[id number]]                  Abnormal            Final result               UA,Microscopic[[id number]]               Abnormal            Final result                 Please view results for these tests on the individual orders.   CBC & AUTO DIFFERENTIAL    Narrative:     The following orders were created for panel order CBC with differential.  Procedure  Abnormality         Status                     ---------                               -----------         ------                     GMW[[id number]]                          Abnormal            Preliminary result         Differential, Automated[[id number]]                          In process                   Please view results for these tests on the individual orders.   RAINBOW DRAW TO LABORATORY    Narrative:     The following orders were created for panel order Brigid Re (ED Adult Blood Draw: Nurse Protocol).  Procedure                               Abnormality         Status                     ---------                               -----------         ------                     Extra Red Top (Plastic)[[id number]]                          In process                 Extra Light Blue YQI[[id number]]                             In process                 Extra Burna Mortimer LOV[[id number]]                            In process                 Pink Top-Blood Bank Hold.Marland KitchenMarland Kitchen[[id number]]                      In process                 Extra Gold KZS[[id number]]                                   In process                   Please view results for these  tests on the individual orders.   DIFFERENTIAL, AUTOMATED (PERFORMABLE)   EXTRA RED TOP (PLASTIC)   EXTRA LIGHT BLUE TOP   EXTRA LIGHT GREEN TOP   PINK TOP-BLOOD BANK HOLD SPECIMEN   EXTRA GOLD TOP       Imaging Results     No orders to display       Consults       Progress Notes / Reassessments     ED Course as of May 09 2106   Sun May 09, 2019   2052 Glucose(!): 101 [JK]   2052 Baseline   Creatinine(!): 2.53 [JK]   2052 Baseline ~9   Hemoglobin(!): 8.9 [JK]   2106 Seen by nephrology [facility]  Nephrology Fellow for the same symptoms on 3.29 Summary:  - decrease diltiazem CD 180 mg daily given dizziness. Target higher BP goal for now  - asked patient to contact hem/onc in [place] to see if EPO is ok given high WBC and Dx of MDS/chronic leukemia  - repeat BMP and CBC  ?  General CKD Recommendations:  -Recommend 0.8g/kg of protein intake for estimated gfr <60.   -Avoid nephrotoxins including NSAIDs and chinese herbs  -renally dose all medications to estimated GFR  -If estimated GFR <30, avoid gadolinium exposure with MRI.  -avoid iodinated contrast, if possible.  If contrast is needed, please contact us for recommendations.  ?  Return to clinic: 4-6 weeks with labs prior  ?      [CS]      ED Course User Index  [CS] Joyce Gross., MD  [JK] Kirby Cortese, Leo Rod., MD           ED Procedure Notes       Any ED procedures performed are documented on separate ED procedure notes.    Clinical Impression   No diagnosis found.    Disposition and Follow-up   Disposition:     No future appointments.    Follow up with:  No follow-up provider specified.    Return precautions are specified on After Visit Summary.    New Prescriptions    No medications on file       Orders Placed This Encounter   ? Urinalysis w/Reflex to Culture (ED Abdominal Pain/Nausea/Vomiting: Nurse Protocol)   ? UA,Dipstick   ? UA,Microscopic   ? Electrolyte Panel (Na, K, Cl, CO2)   ? Urea Nitrogen   ? Creatinine   ? Glucose   ? CBC with differential   ? Rainbow Draw (ED Adult Blood Draw: Nurse Protocol)   ? CBC   ? Differential, Automated   ? Extra [employer] (Plastic)   ? Extra Light Blue Top   ? Extra Burna Mortimer Top   ? Pink Top-Blood Bank Hold Specimen   ? Extra Gold Top   ? Orthostatic blood pressure and pulse   ? ECG, 12 lead   ? EKG REPORT   ? ED 12 LEAD ECG    ? EKG REPORT   ? EKG REPORT       Resident Signature

## 2019-05-10 NOTE — ED Notes
Covering for primary RN's one hour lunch break; pt sitting in gurney, in no apparent distress or discomfort; respirations easy and unlabored; daughter at bedside

## 2019-05-10 NOTE — Other
Patients Clinical Goal:   Clinical Goal(s) for the Shift: Hemodynamic Stabilty. Stable vital sign  Identify possible barriers to advancing the care plan: None  Stability of the patient: Moderately Stable - low risk of patient condition declining or worsening   End of Shift Summary: Patient Lindsay Brennan, on 2LO2, up with a cane and one person assist, unsteady on her feet, BP high this morning, MD aware, lasix and hydralazine ordered. Denies pain. possible bone marrow biopsy today

## 2019-05-10 NOTE — Progress Notes
IP CM ACTIVE DISCHARGE PLANNING  Department of Care Coordination      Admit [id number]  Anticipated Date of Discharge:      Following UU:EKCM, Christoper Fabian., MD      Today's short update     Per IDR:     05/10/19 1415   MD/NP   Today's progression of care Stage IV CKD CML, Hypoxic Respiratory Failure, on RA   Anticipated Consult HEM-ONC       Disposition     Pending transfer to [place],  05/10/2019

## 2019-05-10 NOTE — H&P
[facility]  Geriatrics Service  Admission History and Physical  Patient: Lindsay Brennan, Lindsay Brennan    MRN: [id number] DOB: Apr 26, 1934    Age: 85 y.o. female Date: 05/10/2019     Admission date: 05/10/19  Admitting Team: Geriatrics [facility] Canning       Attending: Early Osmond, MD       Resident: Henderson Cloud, MD   Chief Complaint: Dizziness (for few weeks now. Recently prescribed Diltiazem for HTN and has been titrated down due to dizziness: hx of CVA, leukemia )     History of Present Illness   Lindsay Brennan is a 85 y.o. 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   ?  Patient endorses feeling lightheaded whenever she goes from sitting or laying to standing. Her symptoms started several weeks ago immediately after she started diltiazem 300mg  daily on 03/29/19. She discussed her symptoms with her nephrologist, Dr. Everett Graff, on 3/29, and her dose was decreased to 180mg  daily. This has resulted in an improvement of her symptoms, but they persist. She denies feeling dizzy while at rest. She endorses experiencing a fall approximately 1 week ago, but states it was mechanical (she endorses tripping over a speed bump), landing on her knees; she denies any headstrike.   ?  Patient has been hoping to return to [place] this week, but when she mentioned to family that she had been dizzy recently, and they checked her oxygen saturation. She remarks it was in the low-80's, prompting her to seek emergent care. She endorsed mild SOB when laying flat, which improves when supine. Endorses non-productive cough over the past two days. No fever/chills. Patient completed her COVID vaccination series; no known COVID contacts. She denies any lower extremity swelling. No nausea/vomiting/diarrhea.   ?  [facility] Course:  Initial vitals: T 36.7, HR 70, BP 141/59, SpO2 99%. Labs notable for WBC 126.9k, Hgb 8.9, Bicarb 19, Cr 2.53 (baseline appears 2.0-2.3), uric acid 11.8 (subsequently increased 12.8). CXR with bilateral lower lobe patchy consolidations, most suggestive of pulmonary edema versus pneumonia. No therapeutics given.     [facility] Course: Patient arrived with dyspnea and labs notable for Hb 8.9, HCO3 19, Cr 2.53, and uric acid 12.8. CXR showed b/l lower lobe patchy consolidations, suggestive of pulmonary edema vs. Pneumonia. However, given patient denied fevers, chills, nausea, or cough, and c/f infection was low, antibiotics were deferred. Her lightheadedness was evident upon standing, however orthostatics were unremarkable. Diltiazem was stopped and she had one episode of hypertension, which resolved with Lasix and hydralazine 10mg . Hematology/Oncology was consulted given the extreme leukocytosis and concern that leukostasis could be contributing to pulmonary symptoms, and that high uric acid could be sign of tumor lysis syndrome (and causing urate nephropathy). Per their assessment, the absence of blast cells (<5%) in the peripheral blood does not suggest a blast crisis/ transformation to AML, though a bone marrow biopsy - which the patient declined - would be necessary to confirm or rule out the presence of blasts. They were concerned that elevation in WBC could reflect infection though other than SOB patient did not have infectious s/s. They recommended admission for monitoring and potential treatment of TLS. She was started on IVF and allopurinol. Her oxygenation improved and Cr down-trended.    Of note (per heme/onc note): Her oncologist is Dr. Hubert Azure from the [facility] [place] [phone]). Most recent  bone marrow biopsy September 2018 with 6% blasts, c/w CMML type 1. At that time WBC was 46k, and Hb was 10.7. Last seen by Dr. Silvio Clayman on 04/30/2017 at which time she had WBC 73.5k (Hb 9.1). Through shared decision making patient opted to monitor for progression of disease and not pursue aggressive treatments.    Review of Systems:  Negative unless otherwise specified above.    Geriatric-Specific Review of Systems:  Falls: ***  Nutrition: ***  Swallowing: ***  Dentures: ***  Cognition: ***  Incontinence: ***  Depression: ***  Hearing:***  Vision:***    History   Geriatric:  Marital Status: ***  Occupation: ***  Education: ***  Living situation: ***  Caregiver: ***  Baseline Mental Status: ***    Task No Help Needed Help Needed Who Helps   Myrtis Ser ADLs:      Feeding Yourself []  []     Transferring   (Getting from bed to chair) []  []     Getting to the Toilet []  []     Incontinence []  []     Getting Dressed []  []     Bathing or Showering []  []     [place] iADLs      Using the Telephone []  []     Taking your Medications []  []     Preparing Meals []  []     Managing Money   (keeping track of expenses, paying bills, taxes, going to bank) []  []     Moderately strenuous housework  []  []     Doing Laundry []  []     Shopping for personal items or groceries []  []     Driving []  []     Getting to places beyond walking distance (e.g. by bus/taxi/car) []  []     Mobility       Walking across the room (includes using a cane or walker) []  []     Climbing a flight of stairs []  []       Driving status: ***    Home Medications: Allergies:   Medications that the patient states to be currently taking   Medication Sig   ? acetaminophen 500 mg tablet Take 500 mg by mouth every six (6) hours as needed for Pain.   ? acetaminophen-codeine (TYLENOL #3) 300-30 mg tablet Take 0.5 tablets by mouth every six (6) hours as needed for Severe Pain (Pain Scale 7-10). Max Daily Amount: 2 tablets   ? aspirin 81 mg EC tablet Take 81 mg by mouth daily.   ? cloNIDine 0.1 mg tablet Take 0.1 mg by mouth three (3) times daily.   ? dilTIAZem (CARDIZEM CD) 180 mg 24 hr capsule Take 1 capsule (180 mg total) by mouth daily.   ? epoetin alfa-epbx (RETACRIT) 10,000 units/mL injection Inject 20,000 Units under the skin two (2) times a week .   ? esomeprazole 40 mg capsule Take 40 mg by mouth daily .   ? Ferric Citrate 1 GM 210 MG(Fe) TABS Take 1 tablet by mouth three (3) times daily with meals.   ? gabapentin 100 mg capsule Take 200 mg by mouth at bedtime .   ? lidocaine 5% patch Place 1 patch onto the skin every twelve (12) hours 12 hours on 12 hours off..   ? magnesium oxide 400 mg tablet Take 1 tablet (400 mg total) by mouth daily.   ? potassium chloride 10 mEq CR capsule Take 10 mEq by mouth daily .   ? rosuvastatin 20 mg tablet Take 20 mg by mouth at bedtime .   ? sodium bicarbonate 650  mg tablet Take 1 tablet (650 mg total) by mouth two (2) times daily.   ? SYNTHROID 50 mcg tablet Take 50 mcg by mouth every other day Pt alternates with 75mg .   ? SYNTHROID 75 mcg tablet Take 75 mcg by mouth every other day Pt alternates with 50mg .    Oxycodone-Acetaminophen  Penicillins  Sulfa Antibiotics  Naproxen     Past Medical History:     Past Medical History:   Diagnosis Date   ? CKD (chronic kidney disease)    ? GERD (gastroesophageal reflux disease)    ? History of CVA (cerebrovascular accident) 2011   ? 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 Hyperlipidemia    ? Hypertension    ? Hypothyroidism    ? Leukemia (HCC/RAF)    ? Myelodysplasia (myelodysplastic syndrome) (HCC/RAF)     Past Surgical History:  Past Surgical History:   Procedure Laterality Date   ? CESAREAN SECTION     ? MASTECTOMY Bilateral    ? TOTAL ABDOMINAL HYSTERECTOMY           Social History:     Social History     Socioeconomic History   ? Marital status: Widowed     Spouse name: Not on file   ? Number of children: Not on file   ? Years of education: Not on file   ? Highest education level: Not on file   Occupational History   ? Not on file   Social Needs   ? Financial resource strain: Not on file   ? Food insecurity     Worry: Not on file     Inability: Not on file   ? Transportation needs     Medical: Not on file     Non-medical: Not on file   Tobacco Use   ? Smoking status: Never Smoker   ? Smokeless tobacco: Never Used   Substance and Sexual Activity   ? Alcohol use: Not Currently   ? Drug use: Never   ? Sexual activity: Not on file   Lifestyle   ? Physical activity     Days per week: Not on file     Minutes per session: Not on file   ? Stress: Not on file   Relationships   ? Social Wellsite geologist on phone: Not on file     Gets together: Not on file     Attends religious service: Not on file     Active member of club or organization: Not on file     Attends meetings of clubs or organizations: Not on file     Relationship status: Not on file   Other Topics Concern   ? Need Help Feeding Yourself? Not Asked   ? Need Help Getting Dressed? Not Asked   ? Need Help Using the Telephone? Not Asked   ? Need Help Managing Money? Tree surgeon, [employer]) Not Asked   ? Need Help Shopping for Groceries? Not Asked   ? Need Help Getting Places Beyond Walking Distance? [employer], [employer]) Not Asked   ? Need Help Getting from Bed to Chair? Not Asked   ? Need Help Bathing or Showering? Not Asked   ? Need Help Taking your Medications? Not Asked   ? Need Help Doing Moderately Strenuous Housework? (ex. Laundry) Not Asked   ? Need Help Driving? Not Asked   ? Need Help Getting  to the Toilet? Not Asked   ? Need Help Walking Across the Road? (Includes [place]) Not Asked   ? Need Help Preparing Meals? Not Asked   ? Need Help Shopping for Personal Items? (Toiletries, Medicines) Not Asked   ? Need Help Climbing a Flight of Stairs? Not Asked   ? Do you live with someone who assists you at home? Not Asked   ? Do you get help from family members or friends in your home? Not Asked   ? Do you employ someone to provide health related care or help you in your home? Not Asked   ? Do you provide care for a family member? Not Asked   ? Does your home have rugs in the hallway? Not Asked   ? Does your home have poor lighting? Not Asked   ? Does your home lack grab bars in the bathroom? Not Asked   ? Does your home lack handrails on the stairs? Not Asked   ? Have you noticed any hearing difficulties? Not Asked   ? Do you currently participate in any regular activity to improve or maintain your physical fitness? Not Asked   ? Do you always wear a seatbelt when you ride in a car? Not Asked   ? If you drink alcohol, do you drink more than 7 drinks per week or more than 3 drinks on any given day? Not Asked   ? Has anyone ever been concerned about your drinking? Not Asked   ? Do you exercise at least a day, 3 or more days a week? No   ? Types of Exercise? (List in Comments) No   ? Do you follow a special diet? No   ? Vegan? No   ? Vegetarian? No   ? Pescatarian? No   ? Lactose Free? No   ? Gluten Free? No   ? Omnivore? No   Social History Narrative   ? Not on file      Family History:  Family History   Problem Relation Age of Onset   ? Stroke Mother    ? Emphysema Father    ? Ovarian cancer Sister    ? Heart attack Brother    ? Lung cancer Sister    ? Lung cancer Sister    ? Colon cancer Brother    ? Colon cancer Son          Objective   BP 161/61  ~ Pulse (!) 107  ~ Temp 36.8 ?C (98.2 ?F) (Oral)  ~ Resp (!) 26  ~ Ht 1.549 m (5' 1'')  ~ Wt 49.4 kg (109 lb)  ~ SpO2 (!) 92%  ~ BMI 20.60 kg/m?      General:   Well-appearing in NAD   Eyes:   PERRL, EOM grossly intact, sclerae and conjunctivae anicteric and non-injected bilaterally with no pallor or exudates.   Ears:   Hearing grossly intact   Nose/Mouth:  MMM without exudates, ulcers, or petechiae.   Neck:  Normal range of motion. No JVD   Lymph:   No anterior/posterior cervical or submandibular LAD.   Lungs:   No increased WOB, no dullness to percussion, good air movement bilaterally, CTAB with no RWR.   Chest/Heart:   RRR, normal S1 S2, no MGR.    Abdomen:   Non-distended. No tenderness or masses on palpation, no rebound or guarding.   GU:   Not indicated.   Skin/integument:  No rashes, lesions, or breakdown. Fingernails  with normal contour, color, and lunulae.   Extremities:   No cyanosis, clubbing, or edema.   Neuro:   Alert and interactive, cranial nerves II-XII grossly intact.     CAM DELIRIUM TEST:  Feature One:   A. Acute Change in mental status from baseline? []  yes [x]  no   B. Is there fluctuation in the mental status? []  yes [x]  no    Feature Two:  A. Does that patient have difficulty focusing attention?  []  yes [x]  no    Feature Three:  A.Is there evidence of disorganized thinking?  []  yes [x]  no    Feature Four:  A. Does the patient have an altered level of consciousness (ie: vigilant, lethargic, stuporous, comatous?  []  yes [x]  no    The diagnosis of delirium by CAM requires the presence of features 1 and 2 and either 3 or 4     LABS:  Na/K/Cl/CO2/BUN/Cr/glu:  138/4.0/102/21/45/2.09/78 (04/05 0725)  Ca/Mg/PO4:  9.3/1.4/4.3 (04/05 0725)  WBC/Hgb/Hct/Plts:  103.81/8.1/26.7/202 (04/05 0343)  AST/ALT/Bili T/Alk Phos/Prot/Alb:  32/14/0.6/145/7.1/3.5 (04/05 2952)  PT/INR/APTT/Fib:  20.4/1.9/--/271 (04/05 0343)    MICROBIOLOGY:  Blood    Urine    Respiratory    DIAGNOSTIC IMAGING AND STUDIES:  CXR, 05/09/19:    IMPRESSION:  ?  Stable cardiomediastinal silhouette with mildly tortuous, calcified thoracic aorta. Persistent mild cardiomegaly.  Normal lung volumes. Increased density of the bilateral lower lung zones is accentuated due to overlying breast implants.  There are bilateral lower lobe patchy consolidations, most suggestive of pulmonary edema versus pneumonia.   No right pleural effusion. Trace left pleural effusion. No pneumothorax. Elevation of the left hemidiaphragm  No acute osseous abnormalities.    Assessment and Plan   Britne Borelli is a 85 y.o. 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  ?  #Lightheadness  Symptoms appears to be orthostatic in the setting of diltiazem, though orthostatics notably negative in ED. Query secondary to leukostasis in setting of markedly elevated WBC.  []  hold diltiazem  []  fall precautions  []  PT/OT consult  ?  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 BNP mildly elevated. No acid/base disorder noted on 4/5 VBG. Now s/p IVF and IV Lasix 20mg  x2.  -Consider renal consult in AM if Cr not improving  -Follow-up VBG  -Follow-up UA, UNa, UCr   -Trend Cr daily  -Consider TTE in AM   -Supplemental O2; continous O2 monitoring  ?  #Hyperleukocytosis  #History of chronic myelomonocytic leukemia type-1  WBC 127k on admission with 4% blasts. Appears significantly elevated compared to baseline, though last recorded WBC from 2019 (~75k). Patient has not received treatment previously, and notably CMML is not driven by BCR-ABL mutation. Patient declining bone marrow biopsy; prefers to resume care at home in [place]. Suspect increase in WBC reflects proression of disease rather than acute infection, though will have low threshold to start antibiotics. Elevated procalcitonin (1.12) difficult to interpret  in setting of high circulating cytokines from heme malignancy.  []  re-consult hematology (previously saw patient in [facility] ED)  - low threshold for leukapheresis if worsening pulmonary or neurologic symptoms (or other e/o end-organ damage) c/f for leukostasis (will discuss with heme/onc) --> would need HD line  - will discuss possible cytoreduction therapies with patient and heme/onc: hydroxyurea vs. chemotherapy  -Trend TLS labs (uric acid, phos, K, iCa) q6h  -Trend DIC labs (INR, PTT, fibrinogen, D-dimer, platelets  --> maintain platelets >30lk  -Aggressive hydration (2cc/kg per hour --> 100cc/h in this patient)  -Follow-up hematology malignancy panel  -Follow-up peripheral blood flow cytometry    #Elevated INR, POA:  INR 1.9 on admission. This raises c/f synthetic liver dysfunction due to leukostasis or DIC, which can occur in up to 40% of patients with hyperleukocytosis. Reassuringly, however, other DIC labs WNL (other than D-dimer), and other liver labs WNL (including albumin at 3.5).  ?  #Anemia, POA: Hgb 8.9. Likely 2/2 renal disease and CMML. Iron studies not c/w iron deficiency anemia (ferritin 640, 28% transferrin saturation). Received Epo treatment as outpatient.  -Obtain Fe studies  -Trend CBC daily  ?  Chronic/Stable Medical Problems  #Subclinical hypothyrodism: TSH 5.2 on admission  -Continue levothyroxine , on alternating days   ?  #Mood issue:  -Continue mitrazapine  ?  #HLD:   -Continue rosuvastatin     Inpatient Checklist  Diet: ***  GI ppx:  DVT ppx:  Lines: ***  Tubes/Drains: ***    CODE STATUS: Full Code  Primary Emergency Contact: ***    Disposition:  ***    Ambulatory Status & Fall Risk  []  non-ambulatory  []  walks with cane and/or walker  []  walks independently  []  walks independently but may benefit from an assistive device evaluation with physical therapy  []  needs supervision with walking secondary to poor safety awareness    Discharge Considerations:  []  recommend PT evaluation to see if appropriate for skilled nursing facility   []  recommend increase level of care/supervision then patient currently has at home  []  recommend 24 hour supervision  []  recommend home health services  []  after stabilization patient may return to prior living situation as it is adequate to meet his/her needs  []  recommend new assistive device for ambulation, safety, and fall risk  []  will re-evaluate later in the hospital course once further stabilized    Patient will be discussed with geriatrics attending Dr. Early Osmond.    Signed,  Henderson Cloud, MD/MBA  Internal Medicine, PGY-3  [phone]

## 2019-05-11 ENCOUNTER — Inpatient Hospital Stay
Admission: TF | Admit: 2019-05-11 | Discharge: 2019-05-18 | Disposition: A | Discharge status: Home Health | Payer: MEDICARE | Source: Intra-hospital | Attending: Geriatric Medicine

## 2019-05-11 ENCOUNTER — Ambulatory Visit: Payer: PRIVATE HEALTH INSURANCE

## 2019-05-11 DIAGNOSIS — C9310 Chronic myelomonocytic leukemia not having achieved remission: Secondary | ICD-10-CM

## 2019-05-11 DIAGNOSIS — D631 Anemia in chronic kidney disease: Secondary | ICD-10-CM

## 2019-05-11 DIAGNOSIS — Z8673 Personal history of transient ischemic attack (TIA), and cerebral infarction without residual deficits: Secondary | ICD-10-CM

## 2019-05-11 DIAGNOSIS — D72829 Elevated white blood cell count, unspecified: Secondary | ICD-10-CM

## 2019-05-11 DIAGNOSIS — J9601 Acute respiratory failure with hypoxia: Secondary | ICD-10-CM

## 2019-05-11 DIAGNOSIS — G629 Polyneuropathy, unspecified: Secondary | ICD-10-CM

## 2019-05-11 DIAGNOSIS — R011 Cardiac murmur, unspecified: Secondary | ICD-10-CM

## 2019-05-11 DIAGNOSIS — D469 Myelodysplastic syndrome, unspecified: Secondary | ICD-10-CM

## 2019-05-11 DIAGNOSIS — I10 Essential (primary) hypertension: Secondary | ICD-10-CM

## 2019-05-11 DIAGNOSIS — D553 Anemia due to disorders of nucleotide metabolism: Secondary | ICD-10-CM

## 2019-05-11 DIAGNOSIS — E119 Type 2 diabetes mellitus without complications: Secondary | ICD-10-CM

## 2019-05-11 DIAGNOSIS — N184 Chronic kidney disease, stage 4 (severe): Secondary | ICD-10-CM

## 2019-05-11 DIAGNOSIS — I129 Hypertensive chronic kidney disease with stage 1 through stage 4 chronic kidney disease, or unspecified chronic kidney disease: Secondary | ICD-10-CM

## 2019-05-11 DIAGNOSIS — C9312 Chronic myelomonocytic leukemia, in relapse: Secondary | ICD-10-CM

## 2019-05-11 DIAGNOSIS — R791 Abnormal coagulation profile: Secondary | ICD-10-CM

## 2019-05-11 DIAGNOSIS — R42 Dizziness and giddiness: Secondary | ICD-10-CM

## 2019-05-11 DIAGNOSIS — E782 Mixed hyperlipidemia: Secondary | ICD-10-CM

## 2019-05-11 DIAGNOSIS — N179 Acute kidney failure, unspecified: Secondary | ICD-10-CM

## 2019-05-11 DIAGNOSIS — E039 Hypothyroidism, unspecified: Secondary | ICD-10-CM

## 2019-05-11 DIAGNOSIS — Z86000 Personal history of in-situ neoplasm of breast: Secondary | ICD-10-CM

## 2019-05-11 DIAGNOSIS — E790 Hyperuricemia without signs of inflammatory arthritis and tophaceous disease: Secondary | ICD-10-CM

## 2019-05-11 DIAGNOSIS — N189 Chronic kidney disease, unspecified: Secondary | ICD-10-CM

## 2019-05-11 LAB — D-Dimer
D-DIMER STAGO: 2.75 ug{FEU}/mL — ABNORMAL HIGH (ref <0.60)
D-DIMER STAGO: 2.79 ug{FEU}/mL — ABNORMAL HIGH (ref <0.60)
D-DIMER STAGO: 2.54 ug{FEU}/mL — ABNORMAL HIGH (ref <0.60)

## 2019-05-11 LAB — Basic Metabolic Panel
GLUCOSE: 96 mg/dL (ref 65–99)
POTASSIUM: 3.8 mmol/L (ref 3.6–5.3)
SODIUM: 137 mmol/L (ref 135–146)
SODIUM: 135 mmol/L (ref 135–146)
TOTAL CO2: 23 mmol/L (ref 20–30)
TOTAL CO2: 25 mmol/L (ref 20–30)

## 2019-05-11 LAB — Uric Acid
URIC ACID: 10.7 mg/dL — ABNORMAL HIGH (ref 2.9–7.0)
URIC ACID: 9.1 mg/dL — ABNORMAL HIGH (ref 2.9–7.0)
URIC ACID: 8.5 mg/dL — ABNORMAL HIGH (ref 2.9–7.0)

## 2019-05-11 LAB — Calcium,Ionized
IONIZED CA++,CORRECTED: 1.19 mmol/L (ref 1.09–1.29)
IONIZED CA++,UNCORRECTED: 1.17 mmol/L (ref 1.09–1.29)
IONIZED CA++,UNCORRECTED: 1.19 mmol/L (ref 1.09–1.29)

## 2019-05-11 LAB — Magnesium: MAGNESIUM: 1.3 meq/L — ABNORMAL LOW (ref 1.4–1.9)

## 2019-05-11 LAB — APTT
APTT: 51.4 s — ABNORMAL HIGH (ref 24.4–36.2)
APTT: 52.4 s — ABNORMAL HIGH (ref 24.4–36.2)
APTT: 51.6 s — ABNORMAL HIGH (ref 24.4–36.2)

## 2019-05-11 LAB — TSH with reflex FT4, FT3: TSH: 5.5 u[IU]/mL — ABNORMAL HIGH (ref 0.3–4.7)

## 2019-05-11 LAB — CBC
ABSOLUTE NUCLEATED RBC COUNT: 1.97 10*3/uL — ABNORMAL HIGH (ref 0.00–0.00)
MEAN CORPUSCULAR HEMOGLOBIN: 27.6 pg (ref 26.4–33.4)
MEAN CORPUSCULAR VOLUME: 90 fL (ref 79.3–98.6)
NUCLEATED RBC%, AUTOMATED: 1.4 (ref 34.9–45.2)
RED BLOOD CELL COUNT: 3.55 x10E6/uL — ABNORMAL LOW (ref 3.96–5.09)

## 2019-05-11 LAB — Fibrinogen
FIBRINOGEN: 299 mg/dL (ref 235–490)
FIBRINOGEN: 258 mg/dL (ref 235–490)
FIBRINOGEN: 280 mg/dL (ref 235–490)

## 2019-05-11 LAB — Prothrombin Time Panel
INR: 1.5 s (ref 11.5–14.4)
PROTHROMBIN TIME: 17.7 s — ABNORMAL HIGH (ref 11.5–14.4)
PROTHROMBIN TIME: 17.7 s — ABNORMAL HIGH (ref 11.5–14.4)

## 2019-05-11 LAB — Phosphorus
PHOSPHORUS: 4.2 mg/dL (ref 2.3–4.4)
PHOSPHORUS: 4.5 mg/dL — ABNORMAL HIGH (ref 2.3–4.4)
PHOSPHORUS: 3.7 mg/dL (ref 2.3–4.4)

## 2019-05-11 LAB — Differential, Manual: BLAST CELL: 1 — ABNORMAL HIGH (ref 0.2–0.8)

## 2019-05-11 LAB — Free T4: FREE T4: 1.5 ng/dL (ref 0.8–1.7)

## 2019-05-11 MED ADMIN — FUROSEMIDE 10 MG/ML IJ SOLN: 20 mg | INTRAVENOUS | @ 16:00:00 | Stop: 2019-05-11 | NDC 00409610202

## 2019-05-11 MED ADMIN — HYDROXYUREA 500 MG PO CAPS: 500 mg | ORAL | @ 23:00:00 | Stop: 2019-05-14 | NDC 00904693961

## 2019-05-11 MED ADMIN — HEPARIN SODIUM (PORCINE) 5000 UNIT/ML IJ SOLN: 5000 [IU] | SUBCUTANEOUS | @ 16:00:00 | Stop: 2019-05-19 | NDC 00641040012

## 2019-05-11 MED ADMIN — ROSUVASTATIN CALCIUM 10 MG PO TABS: 20 mg | ORAL | @ 05:00:00 | Stop: 2019-06-10 | NDC 60687024501

## 2019-05-11 MED ADMIN — HEPARIN SODIUM (PORCINE) 5000 UNIT/ML IJ SOLN: 5000 [IU] | SUBCUTANEOUS | @ 01:00:00 | Stop: 2019-05-22

## 2019-05-11 MED ADMIN — LACTATED RINGERS IV SOLN: 75 mL/h | INTRAVENOUS | @ 05:00:00 | Stop: 2019-05-12 | NDC 00338011704

## 2019-05-11 MED ADMIN — LEVOTHYROXINE SODIUM 75 MCG PO TABS: 75 ug | ORAL | @ 16:00:00 | Stop: 2019-06-10 | NDC 51079044120

## 2019-05-11 MED ADMIN — ASPIRIN 81 MG PO CHEW: 81 mg | ORAL | @ 16:00:00 | Stop: 2019-05-19 | NDC 66553000201

## 2019-05-11 MED ADMIN — ASPIRIN 81 MG PO CHEW: 81 mg | ORAL | @ 05:00:00 | Stop: 2019-05-19 | NDC 66553000201

## 2019-05-11 MED ADMIN — PANTOPRAZOLE SODIUM 40 MG PO TBEC: 40 mg | ORAL | @ 05:00:00 | Stop: 2019-05-19 | NDC 50268063915

## 2019-05-11 MED ADMIN — MAGNESIUM SULFATE 4 GM/100ML IV SOLN: 4 g | INTRAVENOUS | @ 16:00:00 | Stop: 2019-05-11 | NDC 67457055400

## 2019-05-11 MED ADMIN — LACTATED RINGERS IV SOLN: 100 mL/h | INTRAVENOUS | Stop: 2019-05-11

## 2019-05-11 MED ADMIN — ROSUVASTATIN CALCIUM 20 MG PO TABS: 20 mg | ORAL | @ 05:00:00 | Stop: 2019-05-19 | NDC 60687024501

## 2019-05-11 MED ADMIN — GABAPENTIN 100 MG PO CAPS: 100 mg | ORAL | @ 05:00:00 | Stop: 2019-05-19 | NDC 60687058001

## 2019-05-11 MED ADMIN — FERROUS SULFATE 325 (65 FE) MG PO TBEC: 325 mg | ORAL | @ 16:00:00 | Stop: 2019-06-10 | NDC 00245010801

## 2019-05-11 MED ADMIN — MAGNESIUM OXIDE 400 (240-241.3 MG) MG (MULTI-GPI) PO TABS: 400 mg | ORAL | @ 16:00:00 | Stop: 2019-05-19 | NDC 64980033901

## 2019-05-11 MED ADMIN — LACTATED RINGERS IV SOLN: 75 mL/h | INTRAVENOUS | @ 19:00:00 | Stop: 2019-05-12 | NDC 00338011704

## 2019-05-11 MED ADMIN — PANTOPRAZOLE SODIUM 40 MG PO TBEC: 40 mg | ORAL | @ 16:00:00 | Stop: 2019-06-09 | NDC 50268063915

## 2019-05-11 MED ADMIN — LIDOCAINE 5 % EX PTCH: 1 | TRANSDERMAL | @ 05:00:00 | Stop: 2019-05-19

## 2019-05-11 MED ADMIN — MAGNESIUM OXIDE 400 (240-241.3 MG) MG (MULTI-GPI) PO TABS: 400 mg | ORAL | @ 05:00:00 | Stop: 2019-05-19 | NDC 64980033901

## 2019-05-11 NOTE — Consults
Inpatient Hematology/Oncology Consultation    Patient name:  Lindsay Brennan  MRN:  [id number]  DOB:  November 18, 1934  Location: 5228/1  Date of service:  05/11/2019    Reason for consultation: CMML  Primary service: Geriatrics   Referring provider: Iran Planas, MD  PCP: Iran Planas, MD  Outpatient hematologist/oncologist (if any): Dr. Osvaldo Human of [place] [phone]    History of Presenting Illness:  Lindsay Brennan is a 85 y.o. 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 Per the patient, lightheadedness has time correlation with diltiazem, improving with decrease in the dosage, and only occurring when she rises from seated position. She was planning to go back home to [place] but mentioned dizziness to her family who reportedly found her O2 saturation to be 80's at home, prompting admission. She says she only has dyspnea when lying flat, but improves in supine. She also endorses cough for the past 2 days. COVID negative.   ?  [facility], evaluated by oncology service, started on IVF and allopurinol for uric acid 11.8. other notable labs include LDH 1426, d-dimer 2.4, fibrinogen 271, PT 20.4    CXR demonstrated bilateral lower lobe consolidations  abd [place] with marked splenomegaly  ?  05/11/19 transferred to [facility] to geriatrics service.   Patient reports weight loss, occasional night sweats, early satiety. Continues to have dizziness and SOB with exertion.     ROS:  A complete 14 point review of systems has been is negative except for as documented.    Past Medical History:  Past Medical History:   Diagnosis Date   ? CKD (chronic kidney disease)    ? GERD (gastroesophageal reflux disease)    ? History of CVA (cerebrovascular accident) 2011   ? 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 Hyperlipidemia ? Hypertension    ? Hypothyroidism    ? Leukemia (HCC/RAF)    ? Myelodysplasia (myelodysplastic syndrome) (HCC/RAF)        Past Surgical History:  Past Surgical History:   Procedure Laterality Date   ? CESAREAN SECTION     ? MASTECTOMY Bilateral    ? TOTAL ABDOMINAL HYSTERECTOMY         Social History:  Social History     Tobacco Use   ? Smoking status: Never Smoker   ? Smokeless tobacco: Never Used   Substance Use Topics   ? Alcohol use: Not Currently     Family History:  Family History   Problem Relation Age of Onset   ? Stroke Mother    ? Emphysema Father    ? Ovarian cancer Sister    ? Heart attack Brother    ? Lung cancer Sister    ? Lung cancer Sister    ? Colon cancer Brother    ? Colon cancer Son        Allergies:  Allergies   Allergen Reactions   ? Oxycodone-Acetaminophen Other (See Comments)   ? Penicillins Hives   ? Sulfa Antibiotics Agranulocytosis   ? Naproxen Rash     Aleve       Outpatient Medications:  Facility-Administered Medications Prior to Admission   Medication Dose Route Frequency Provider Last Rate Last Admin   ? [DISCONTINUED] epoetin alfa-epbx (Retacrit) 10,000 units/mL inj 20,000 Units  20,000 Units Subcutaneous Once Boonpheng, Boonphiphop, MD         Medications Prior to Admission   Medication Sig Dispense Refill Last Dose   ? aspirin 81 mg EC tablet Take 81 mg by mouth daily.   05/09/2019 at Unknown time   ? cloNIDine 0.1 mg tablet Take 0.1 mg by mouth two (2) times daily.      ? dilTIAZem (CARDIZEM CD) 180 mg 24 hr capsule Take 180 mg by mouth daily.      ? epoetin alfa-epbx (RETACRIT) 10,000 units/mL injection Inject 20,000 Units under the skin Every month.      ? esomeprazole 40 mg capsule Take 40 mg by mouth daily .   05/09/2019 at Unknown time   ? Ferric Citrate 1 GM 210 MG(Fe) TABS Take 1 tablet by mouth three (3) times daily with meals. 270 tablet 1    ? gabapentin 100 mg capsule Take 200 mg by mouth at bedtime .   05/09/2019 at Unknown time   ? rosuvastatin 20 mg tablet Take 20 mg by mouth at bedtime .   05/10/2019 at Unknown time   ? sodium bicarbonate 650 mg tablet Take 1 tablet (650 mg total) by mouth two (2) times daily. 60 tablet 2 05/09/2019 at Unknown time   ? SYNTHROID 50 mcg tablet Take 50 mcg by mouth every other day Pt alternates with 75mg .      ? SYNTHROID 75 mcg tablet Take 75 mcg by mouth every other day Pt alternates with 50mg .   05/10/2019 at Unknown time   ? allopurinol 100 mg tablet Take 2 tablets (200 mg total) by mouth three (3) times daily. (Patient not taking: Reported on 05/11/2019.)   Not Taking at Unknown time   ? bisacodyl 5 mg EC tablet Take 1 tablet (5 mg total) by mouth daily as needed for Constipation. (Patient not taking: Reported on 05/11/2019.) 100 tablet 0 Not Taking at Unknown time   ? heparin 5000 unit/mL injection Inject 1 mL (5,000 Units total) under the skin every twelve (12) hours. (Patient not taking: Reported on 05/11/2019.)   Not Taking at Unknown time   ? hydrALAZINE 10 mg tablet Take 1 tablet (10 mg total) by mouth every six (6) hours. (Patient not taking: Reported on 05/11/2019.)   Not Taking at Unknown time   ? lactated ringers IV soln Inject 50 mL/hr into the vein continuous. (Patient not taking: Reported on 05/11/2019.)   Not Taking at Unknown time   ? pantoprazole 40 mg DR tablet Take 1 tablet (40 mg total) by mouth daily. (Patient not taking: Reported on 05/11/2019.)   Not Taking at Unknown time   ? polyethylene glycol powder packet Take 1 packet (17 g total) by mouth daily. (Patient not taking: Reported on 05/11/2019.)   Not Taking at Unknown time   ? senna 8.6 mg tablet Take 1 tablet by mouth at bedtime as needed for Constipation. (Patient not taking: Reported on 05/11/2019.)   Not Taking at Unknown time     Inpatient Medications:  Scheduled Meds:  ? aspirin  81 mg Oral Daily   ? ferrous sulfate  325 mg Oral Every Other Day   ? gabapentin  100 mg Oral QHS   ? heparin  5,000 Units Subcutaneous Q12H   ? [START ON 05/12/2019] levothyroxine  50 mcg Oral Every Other Day ? levothyroxine  75 mcg Oral Every Other Day   ? lidocaine  1 patch Transdermal Q24H   ? magnesium oxide  400 mg Oral Daily   ? pantoprazole  40 mg Oral Daily   ? rosuvastatin  20 mg Oral QHS     Continuous Infusions:  ? lactated ringers 75 mL/hr (05/11/19 1148)     PRN Meds:.acetaminophen, bisacodyl, hydrALAZINE, magnesium hydroxide, melatonin, senna    Vital signs:  Vitals:    05/11/19 0621 05/11/19 0835 05/11/19 1030 05/11/19 1144   BP: 172/72 172/77 159/67 168/68   Pulse: 71 73 89 89   Resp: 20   17   Temp: 36.1 ?C (97 ?F)   36.4 ?C (97.6 ?F)   TempSrc: Temporal   Temporal   SpO2: 97% 93% 94% 94%   Weight: 47.5 kg (104 lb 11.2 oz)      Height:         Vital sign ranges (24h):  Temp:  [36.1 ?C (97 ?F)-37.3 ?C (99.2 ?F)] 36.4 ?C (97.6 ?F)  Heart Rate:  [71-107] 89  Resp:  [17-28] 17  BP: (154-174)/(59-77) 168/68  NBP Mean:  [90-109] 98  SpO2:  [89 %-97 %] 94 %    Intake/Output (24h):    Intake/Output Summary (Last 24 hours) at 05/11/2019 1307  Last data filed at 05/11/2019 1146  Gross per 24 hour   Intake 440 ml   Output 1875 ml   Net -1435 ml       Physical Exam:  General: Appears well-developed, well-nourished and close to stated age.   Head: Normocephalic, atraumatic.  Eyes: PERRL without icterus.   CV: Regular in rate and rhythm, no murmurs or gallops.   Chest: Clear to auscultation bilaterally without wheezing or rhonchi. No crackles noted. Respiratory effort appears normal.   Abdomen: Soft, nontender and nondistended. Bowel sounds are present and normoactive. +splenomegaly  Musculoskeletal: No edema. No cyanosis. Extremities are warm and well-perfused.   Hematologic: No bruising, purpura or petechiae are noted.   Dermatologic: No rashes appreciated.   Psychiatric: Affect appropriate.  Pleasant and conversant.     Labs:  Results for orders placed or performed during the hospital encounter of 05/10/19   CBC   Result Value Ref Range    White Blood Cell Count 145.73 (H) 4.16 - 9.95 x10E3/uL    Red Blood Cell Count 3.39 (L) 3.96 - 5.09 x10E6/uL    Hemoglobin 9.7 (L) 11.6 - 15.2 g/dL    Hematocrit 16.1 (L) 34.9 - 45.2 %    Mean Corpuscular Volume 90.0 79.3 - 98.6 fL    Mean Corpuscular Hemoglobin 28.6 26.4 - 33.4 pg    MCH Concentration 31.8 31.5 - 35.5 g/dL    Red Cell Distribution Width-SD 73.3 (H) 36.9 - 48.3 fL    Red Cell Distribution Width-CV 23.0 (H) 11.1 - 15.5 %    Platelet Count, Auto 234 143 - 398 x10E3/uL    Mean Platelet Volume      Nucleated RBC%, automated 1.4 No Ref. Range %    Absolute Nucleated RBC Count 1.97 (H) 0.00 - 0.00 x10E3/uL    Neutrophil Abs (Prelim) 73.32 See Absolute Neut Ct. x10E3/uL   CBC & Auto Differential    Narrative    The following orders were created for panel order CBC & Auto Differential.  Procedure                               Abnormality         Status                     ---------                               -----------         ------  JYN[[id number]]                          Abnormal            Final result               Differential, Automated[[id number]]                                                       Please view results for these tests on the individual orders.        Lab Results   Component Value Date    INR 1.5 05/11/2019    PT 17.4 (H) 05/11/2019     Results for orders placed or performed during the hospital encounter of 05/10/19   Basic Metabolic Panel   Result Value Ref Range    Sodium 135 135 - 146 mmol/L    Potassium 3.8 3.6 - 5.3 mmol/L    Chloride 98 96 - 106 mmol/L    Total CO2 22 20 - 30 mmol/L    Anion Gap 15 8 - 19 mmol/L    Glucose 102 (H) 65 - 99 mg/dL    Creatinine 8.65 (H) 0.60 - 1.30 mg/dL    GFR Estimate for African American 22 See GFR Additional Information mL/min/1.80m2    GFR Estimate for Non-African American 19 See GFR Additional Information mL/min/1.75m2    GFR Additional Information See Comment     Urea Nitrogen 51 (H) 7 - 22 mg/dL    Calcium 9.8 8.6 - 78.4 mg/dL     Results for orders placed or performed during the hospital encounter of 05/09/19   Hepatic Funct Panel   Result Value Ref Range    Total Protein 7.1 6.1 - 8.2 g/dL    Albumin 3.5 (L) 3.9 - 5.0 g/dL    Bilirubin,Total 0.6 0.1 - 1.2 mg/dL    Bilirubin,Conjugated 0.2 <=0.3 mg/dL    Alkaline Phosphatase 145 (H) 37 - 113 U/L    Aspartate Aminotransferase 32 13 - 47 U/L    Alanine Aminotransferase 14 8 - 64 U/L       Pertinent imaging:  05/10/19 abd US  IMPRESSION:  1. Marked splenomegaly.  Mild hepatomegaly.  2. Borderline right hydronephrosis.  ?  05/09/19 CXR  IMPRESSION:  ?  Stable cardiomediastinal silhouette with mildly tortuous, calcified thoracic aorta. Persistent mild cardiomegaly.  Normal lung volumes. Increased density of the bilateral lower lung zones is accentuated due to overlying breast implants.  There are bilateral lower lobe patchy consolidations, most suggestive of pulmonary edema versus pneumonia.   No right pleural effusion. Trace left pleural effusion. No pneumothorax. Elevation of the left hemidiaphragm  No acute osseous abnormalities.  ?    Pertinent pathology:  Patlent Name: Lindsay Brennan, Lindsay Brennan Accession#: [id number] Med. Rec. # [id number] Taken: 10/30/2016 Encounter #: [id number] Received: 10/30/2016 Gender: F Reported: 11/04/2016 16:53 DOB: 07/18/1934 (Age: 55)  Submitting Physician: Hubert Azure D M.D.  Additional Physician(s):  Location: [facility]  Clinical Information:  85 year old with remota history of mild pancytopenia in 2008. There ara no recent history or CBC studies.  Specimen Received:  A: Bone marrow,aspirate for clot section, pic,cyto sent to iu B: Bone marrow, biopsy (decalelfied),Ipic  ?  Final  Pathologic Diagnosis:  id B. Bone marrow, bio and  aspirate for clot section, Jeft posterioriliac crest: Myeloid neoplasm. See note.  As the senior physician, ~ attest that I: (i) examined the relevant preparation(s) for the specimen(s); and (ii) rendered or confirmed the diagnosis(es).  ?Electronically Signed ngz1/10/30/2016 Jobie Quaker, M.D. (Pathologist)  Deanne Coffer, M.D. (Resident) Signing Location: [facility], 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 Clinical correlation and correlation with cytogenetics/FISH study including BCR-ABL7 fusion study is recommended for further classification.  Dr. 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  ?  Cellularity: Markedly hypercellular (> 90%) Myslopoiesis: Markedly increased with full maturation Erythropoiesis: Decreased Megakaryopoiesis: Markedly increased with frequent hypolobated/monolobated megakaryocytes including small hyperchromatic forms Additional studles: Reticulin stain shows mild increasein reticulin fibrosis (1+) with appropriate control,  Theimmunohistechemical stains are performedin this tase and same of the antigens may also ba evaluated by flow cytometry, Concument immunchistechemieal stains on tissue sections are Indicated in order to correlate immunophenotype with cell morphology, evaluate spatial dletribution/architectural features and/or for a quantitation of disease involvement  Bone marrow,aspirate smear:  Sample quality: Adequate  ME ratio: Markedly increased, ~ 8:1 Blasts: Not increased (4%) ' Myelopoiesis: Markedly increased with occasional hypolobated neutrophils Erythropoiesis: Decreased with megaloblastoid changes Megakaryopoiesis: Markedly increased with frequent hypolobated/monolobated megakaryocytes Comments: No clusters of blasts, no Auer rods. Additional studies: Iron stain performed at the [facility] showsincreased storage iron with no ring sideroblasts. Control stains appropriately,  Bone marrow differential:  Percent  Blasts 2  Promyeltocytes 7  Myelocytes 23  Metamyelocytes 6  Bands 16  Polys 30  Lymphocytes 3  Monocytes 2  Eosinophils 4  Basophils 1  Nucleated RBCs 10  Peripheral blood, smear:  CBC: WBC 46 k/cumm, hemoglobIn 10,7 g/dL, MCV 81 fL, ROW 56.2%, platelet count 316 k/cumm Differential: Neutrophils 57%, lymphocytes 5%, monocytes 21%, myelocytas 3%, Metamyelocytes 8%, band 6%  RBCs: Normocytic anaemia with anlsopgikilocytosis. WEBCs: Leucocytosis, granulocytosis with left-shift, monocytosis. Platelets: Normal platelet caunt with occasional giantplatelets  ?  Results-Comments  CD34 and p53 stains show rare CD34+ blasts (<5%) and exceedingly rare p53+ cells. Controls stain appropriately.  ?  INTERERETATION: (600 cell diff)  ?  Bone Marrow Aspirate specimen is adequate and is markedly hypercellular. Spicules are present.  Megakaryocytes Are increaged in numberwith occasional small mono/hypolobated forms and rare small separated lobe forms identified. My elopoiesis Is abnormal, increased, and proceeds to completion. Blasts and promonocytes comprise 6% of total nucleated cells. 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 The M/E ratio is 8.871.  Erythroid series Shows occasional megaloblastoid changes.  Lymphoid Lymphocytes comprise 2% of total nucleated cells,  Comments: Correlate with bone marrow biopsy and cytogenetic studies,  Tron Stain Results: +2/6 Iron in reticulum, no ringed formsidentified.  Control: positive.  IMPRESSION: DATESIGNED OUT: 11/01/2016 TIME: 10:10 CMML-1.  COMMENT: The marrow biopsy was not available at the time of finalizing the aspirate report. In the event of a marrow biopsy report with discrepant results an addendum will be provided by [facility].  ?  My blast count was 8% ( 300 cell differential) with 15% monocytes; trilineage dysplasia; increased numbers of megakaryocytes and 90-100% cellular particles.  Becauseofthe high WBC I did my counting very close to the particles to void any dilution effect from the peripheral blood.  1% blasts in PBS and 19% monocytes.  JMB      Impression and Recommendations:  Lindsay Brennan is  a 85 y.o. 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 We are asked to consult regarding management of hyperleukocytosis.    Patient with symptomatic hyperleukocytosis. I recommended we start hydroxyurea 500mg  BID (50% dose reduction for CrCl<60)  Spoke with patients primary hematologist Dr. Marcellus Scott who is in agreement with hydrea. Would wish to avoid HMA given her age. Anticipate to see improvement in WBC within 24-48 hours. Suspect her dizziness and hypoxia are a result of elevated WBC, however I do not think she needs urgent leukopheresis give hypoxia is improving.  We will consider leukopheresis if symptoms do not improve   Continue IVF and allopurinol for elevated uric acid  Follow up flow cytometry results    Thank you for this consultation. We will continue to follow the patient.    I spent more than 60 minutes with the patient with more than 50% of this time being face to face in counseling, review of records, and coordination of care.     Nancyann Cotterman, D.O.  Hematology/Oncology

## 2019-05-11 NOTE — Nursing Note
0800: A&OX4, RA, non tele. BP 172/72  ~ Pulse 71  ~ Temp 36.1 ?C (97 ?F) (Temporal)  ~ Resp 20  ~ Ht 1.549 m (5' 1'')  ~ Wt 47.5 kg (104 lb 11.2 oz)  ~ SpO2 93%  ~ BMI 19.78 kg/m?  Skin intact. BMAT 3 OOB to chair. Pt took am meds whole. PIV CDI. Pt denies pain.  Instructed the patient to call for help. Bed in lowest position, call light within reach, belongings within reach.

## 2019-05-11 NOTE — Other
Patients Clinical Goal:   Clinical Goal(s) for the Shift: vss, pain management, sleep promotion, fall precaution, IV fluids  Identify possible barriers to advancing the care plan:   Stability of the patient: Moderately Unstable - medium risk of patient condition declining or worsening    End of Shift Summary:   Pt is aox4, cooperative and pleasant. 1L NC, cont pulse ox only. No complaints of pain  But complains of fatigue when ambulating. IV fluids, LR at 75cc/hr continuous. Bed alarm on, call light within reach, bed rails up x2. Daughter at bedside at the start of shift. No acute events overnight.    Blood pressure 154/64, pulse 95, temperature 37.1 ?C (98.8 ?F), temperature source Oral, resp. rate 20, height 1.549 m (5' 1''), weight 46.9 kg (103 lb 8 oz), SpO2 94 %.      GERIATRICS END OF SHIFT - DISCHARGE MARKERS of Instability Checklist  Nursing assessment:     Indicator   Cutoff Check if indicator needs to be addressed (meets cutoff)   Situation/Action/Comment   O2 requirement New since admission []     PO intake Less than 50% [x]     Urination None in past 8 hours/last shift []     Foley New since admission []     Pain Present  []     Bowel movements  <1 in 2 days or >3 in 24 hours []     Delirium Unable to follow simple commands or participate with PT/OT []     Mobility Unable to stand and/or walk to bathroom [x]  OOB to chair 1 times.  Ambulated 10 feet.       BOOST / SAFE TRANSITION - DISCHARGE INDICATORS    Indicator Check if indicator has red ''P''    Situation/Action/Comment     Problems with Medications  []     Psychological  []     Principal Diagnosis  []     Physical Limitations  []     Poor Health Literacy  []     Patient Support  []     Prior Hospitalizations  [x]     Palliative Care  []       DELIRIUM PREVENTION  Protocols Strategies Check if implemented Comments   Risk factors >3 present- High risk [x]     Purposeful orientation Reorient, purposeful orienting conversation  Familiar objects in room [x]   [x]  Therapeutic activities Volunteer visit  Cognitive stimulation activities: games, reading, music []   [x]     Vision & hearing Assistance with: Leisure centre manager  Assistance with: hearing aids/hearing amplifier []   []     Feeding & hydration Assistance with feeding  Assistance with dentures []   []     Sleep hygiene Shades/blinds/lights on during day, limit naps during day  Quiet environment, consolidate care [x]     Mobilization BMAT 3-4: Ambulate TID  BMAT 2: OOB daily to chair for meals ? 2 hours, each time  BMAT 1: OOB to cardiac chair daily for meals ? 2 hours, each time []   [x]   []     Pain Non-narcotics ATC  Non-pharmacological: oil/aromatherapy, massage, music [x]   [x]     Maintain safety Fall precautions, volunteer visit, family at bedside, tele sitter, constant observer [x]     Manage agitation Redirect with calm, gentle voice and avoid confrontation  Avoid restraints and use alternative to restraints  Doll, music or animal therapy, as appropriate  Volunteer for companionship if safe and appropriate [x]   [x]   []   []       DELIRIUM: CAM (+)  Protocols Strategies Check if implemented Comments   New-onset  MD contacted  Delirium order-set initiated  Bladder scan to R/O retention  Assess stool impaction  Medication reviewed with pharmacist []   []   []   []   []  MD Name:   Existing Manage and prevent further delirium []

## 2019-05-11 NOTE — H&P
[facility] Department of Medicine  Geriatrics Service  Admission History and Physical  Patient: Lindsay Brennan, Lindsay Brennan    MRN: [id number] DOB: 12-22-1934    Age: 85 y.o. female Date: 05/11/2019     Admission date: 05/10/19  Admitting Team: Geriatrics Doylene Canning       Attending: Early Osmond, MD       Resident: Henderson Cloud, MD   Chief Complaint: Dizziness and SOB     History of Present Illness   Lindsay Brennan is a 85 y.o. 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   ?  Patient endorses feeling lightheaded whenever she goes from sitting or laying to standing. Her symptoms started several weeks ago immediately after she started diltiazem 300mg  daily on 03/29/19. She discussed her symptoms with her nephrologist, Dr. Everett Graff, on 3/29, and her dose was decreased to 180mg  daily. This has resulted in an improvement of her symptoms, but they persist. She denies feeling dizzy while at rest. She endorses experiencing a fall approximately 1 week ago, but states it was mechanical (she endorses tripping over a speed bump), landing on her knees; she denies any headstrike.   ?  Patient has been hoping to return to [place] this week, but when she mentioned to family that she had been dizzy recently, and they checked her oxygen saturation. She remarks it was in the low-80's, prompting her to seek emergent care. She endorsed mild SOB when laying flat, which improves when supine. Endorses non-productive cough over the past two days. No fever/chills. Patient completed her COVID vaccination series; no known COVID contacts. She denies any lower extremity swelling. No nausea/vomiting/diarrhea.   ?  [place] [facility] Course:  Initial vitals: T 36.7, HR 70, BP 141/59, SpO2 99%. Labs notable for WBC 126.9k, Hgb 8.9, Bicarb 19, Cr 2.53 (baseline appears 2.0-2.3), uric acid 11.8 (subsequently increased 12.8). CXR with bilateral lower lobe patchy consolidations, most suggestive of pulmonary edema versus pneumonia. No therapeutics given.     [facility] Course: Patient arrived with dyspnea and labs notable for Hb 8.9, HCO3 19, Cr 2.53, and uric acid 12.8. CXR showed b/l lower lobe patchy consolidations, suggestive of pulmonary edema vs. Pneumonia. However, given patient denied fevers, chills, nausea, or cough, and c/f infection was low, antibiotics were deferred. Her lightheadedness was evident upon standing, however orthostatics were unremarkable. Diltiazem was stopped and she had one episode of hypertension, which resolved with Lasix and hydralazine 10mg . Hematology/Oncology was consulted given the extreme leukocytosis and concern that leukostasis could be contributing to pulmonary symptoms, and that high uric acid could be sign of tumor lysis syndrome (and causing urate nephropathy). Per their assessment, the absence of blast cells (<5%) in the peripheral blood does not suggest a blast crisis/ transformation to AML, though a bone marrow biopsy - which the patient declined - would be necessary to confirm or rule out the presence of blasts. They were concerned that elevation in WBC could reflect infection though other than SOB patient did not have infectious s/s. They recommended admission for monitoring and potential treatment of TLS. She was started on IVF and allopurinol. Her oxygenation improved and Cr down-trended.    Of note (per heme/onc note): Her oncologist is Dr. Hubert Azure from the [place] [facility] [place] [phone]). Most recent bone marrow biopsy September 2018 with 6% blasts, c/w CMML type 1. At that time WBC was 46k, and Hb  was 10.7. Last seen by Dr. Silvio Clayman on 04/30/2017 at which time she had WBC 73.5k (Hb 9.1). Through shared decision making patient opted to monitor for progression of disease and not pursue aggressive treatments.    Most recent labs confirmed by [facility] nephrologist on-call, Dr. Kerry Hough [phone]):  - Hb 10.3  - platelets 177k  - WBC 45k  - Cr 1.8  - BUN 45    Review of Systems:  Negative unless otherwise specified above.    Geriatric-Specific Review of Systems:  Falls: 1 (mechanical), no fear of falling  Nutrition: adequate, tolerates regular diet  Swallowing: no issues  Dentures: none  Cognition: no issues  Incontinence: no issues  Depression: denies  Hearing: no issues  Vision: no issues    History   Geriatric:  Marital Status: widowed  Occupation: formerly worked in Education officer, community: high school  Living situation: lives with daughter Lindsay Brennan in [place], but planning on returning to [place] this month  Caregiver: daughter Lindsay Brennan  Baseline Mental Status: fully alert and oriented    Task No Help Needed Help Needed Who Helps   Myrtis Ser ADLs:      Feeding Yourself [x]  []     Transferring   (Getting from bed to chair) [x]  []     Getting to the Toilet [x]  []     Incontinence [x]  []     Getting Dressed [x]  []     Bathing or Showering [x]  []     [place] iADLs      Using the Telephone [x]  []     Taking your Medications [x]  []     Preparing Meals [x]  []     Managing Money   (keeping track of expenses, paying bills, taxes, going to bank) [x]  []     Moderately strenuous housework  [x]  []     Doing Laundry [x]  []     Shopping for personal items or groceries [x]  []     Driving [x]  []     Getting to places beyond walking distance (e.g. by bus/taxi/car) [x]  []     Mobility       Walking across the room (includes using a cane or walker) [x]  []     Climbing a flight of stairs [x]  []       Driving status: drove as recently as October 2020 (when she was in [place])    Home Medications: Allergies:   Medications that the patient states to be currently taking   Medication Sig   ? allopurinol 100 mg tablet Take 2 tablets (200 mg total) by mouth three (3) times daily.   ? aspirin 81 mg EC tablet Take 81 mg by mouth daily.   ? esomeprazole 40 mg capsule Take 40 mg by mouth daily .   ? gabapentin 100 mg capsule Take 200 mg by mouth at bedtime .   ? hydrALAZINE 10 mg tablet Take 1 tablet (10 mg total) by mouth every six (6) hours.   ? pantoprazole 40 mg DR tablet Take 1 tablet (40 mg total) by mouth daily.   ? polyethylene glycol powder packet Take 1 packet (17 g total) by mouth daily.   ? rosuvastatin 20 mg tablet Take 20 mg by mouth at bedtime .   ? senna 8.6 mg tablet Take 1 tablet by mouth at bedtime as needed for Constipation.   ? sodium bicarbonate 650 mg tablet Take 1 tablet (650 mg total) by mouth two (2) times daily.   ? SYNTHROID 75 mcg tablet Take 75 mcg by mouth every other day Pt alternates with  50mg .    Oxycodone-Acetaminophen  Penicillins  Sulfa Antibiotics  Naproxen     Past Medical History:     Past Medical History:   Diagnosis Date   ? CKD (chronic kidney disease)    ? GERD (gastroesophageal reflux disease)    ? History of CVA (cerebrovascular accident) 2011   ? 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 Hyperlipidemia    ? Hypertension    ? Hypothyroidism    ? Leukemia (HCC/RAF)    ? Myelodysplasia (myelodysplastic syndrome) (HCC/RAF)     Past Surgical History:  Past Surgical History:   Procedure Laterality Date   ? CESAREAN SECTION     ? MASTECTOMY Bilateral    ? TOTAL ABDOMINAL HYSTERECTOMY           Social History:     Social History     Socioeconomic History   ? Marital status: Widowed     Spouse name: Not on file   ? Number of children: Not on file   ? Years of education: Not on file   ? Highest education level: Not on file   Occupational History   ? Not on file   Social Needs   ? Financial resource strain: Not on file   ? Food insecurity     Worry: Not on file     Inability: Not on file   ? Transportation needs     Medical: Not on file     Non-medical: Not on file   Tobacco Use   ? Smoking status: Never Smoker   ? Smokeless tobacco: Never Used   Substance and Sexual Activity   ? Alcohol use: Not Currently   ? Drug use: Never   ? Sexual activity: Not on file   Lifestyle   ? Physical activity     Days per week: Not on file     Minutes per session: Not on file   ? Stress: Not on file   Relationships   ? Social Wellsite geologist on phone: Not on file     Gets together: Not on file     Attends religious service: Not on file     Active member of club or organization: Not on file     Attends meetings of clubs or organizations: Not on file     Relationship status: Not on file   Other Topics Concern   ? Need Help Feeding Yourself? Not Asked   ? Need Help Getting Dressed? Not Asked   ? Need Help Using the Telephone? Not Asked   ? Need Help Managing Money? Tree surgeon, [employer]) Not Asked   ? Need Help Shopping for Groceries? Not Asked   ? Need Help Getting Places Beyond Walking Distance? [employer], [employer]) Not Asked   ? Need Help Getting from Bed to Chair? Not Asked   ? Need Help Bathing or Showering? Not Asked   ? Need Help Taking your Medications? Not Asked   ? Need Help Doing Moderately Strenuous Housework? (ex. Laundry) Not Asked   ? Need Help Driving? Not Asked   ? Need Help Getting to the Toilet? Not Asked   ? Need Help Walking Across the Road? (Includes Ephraim Hamburger) Not Asked   ? Need Help Preparing Meals? Not Asked   ? Need Help Shopping for Personal Items? (Toiletries, Medicines) Not Asked   ? Need Help Climbing a Flight of Stairs?  Not Asked   ? Do you live with someone who assists you at home? Not Asked   ? Do you get help from family members or friends in your home? Not Asked   ? Do you employ someone to provide health related care or help you in your home? Not Asked   ? Do you provide care for a family member? Not Asked   ? Does your home have rugs in the hallway? Not Asked   ? Does your home have poor lighting? Not Asked   ? Does your home lack grab bars in the bathroom? Not Asked   ? Does your home lack handrails on the stairs? Not Asked   ? Have you noticed any hearing difficulties? Not Asked   ? Do you currently participate in any regular activity to improve or maintain your physical fitness? Not Asked   ? Do you always wear a seatbelt when you ride in a car? Not Asked   ? If you drink alcohol, do you drink more than 7 drinks per week or more than 3 drinks on any given day? Not Asked   ? Has anyone ever been concerned about your drinking? Not Asked   ? Do you exercise at least a day, 3 or more days a week? No   ? Types of Exercise? (List in Comments) No   ? Do you follow a special diet? No   ? Vegan? No   ? Vegetarian? No   ? Pescatarian? No   ? Lactose Free? No   ? Gluten Free? No   ? Omnivore? No   Social History Narrative   ? Not on file      Family History:  Family History   Problem Relation Age of Onset   ? Stroke Mother    ? Emphysema Father    ? Ovarian cancer Sister    ? Heart attack Brother    ? Lung cancer Sister    ? Lung cancer Sister    ? Colon cancer Brother    ? Colon cancer Son          Objective   BP 172/72  ~ Pulse 71  ~ Temp 36.1 ?C (97 ?F) (Temporal)  ~ Resp 20  ~ Ht 1.549 m (5' 1'')  ~ Wt 47.5 kg (104 lb 11.2 oz)  ~ SpO2 93%  ~ BMI 19.78 kg/m?      General:   Well-appearing in NAD   Eyes:   PERRL, EOM grossly intact, sclerae and conjunctivae anicteric and non-injected bilaterally with no pallor or exudates.   Ears:   Hearing grossly intact   Nose/Mouth:  MMM without exudates, ulcers, or petechiae.   Neck:  Normal range of motion. No JVD   Lymph:   No anterior/posterior cervical or submandibular LAD.   Lungs:   No increased WOB, no dullness to percussion, good air movement bilaterally, mild crackles at L lung base, otherwise CTAB with no RWR.   Chest/Heart:   RRR, normal S1 S2, crescendo-decrescendo 2/6 systolic murmur heard best at 2nd R intercostal space, otherwise no MGR.    Abdomen:   Non-distended. No tenderness or masses on palpation, no rebound or guarding.   GU:   Not indicated.   Skin/integument:  No rashes, lesions, or breakdown. Fingernails with normal contour, color, and lunulae.   Extremities:   No cyanosis, clubbing, or edema.   Neuro:   Alert and interactive, cranial nerves II-XII grossly intact, ambulates with shuffling gait  without assistance     CAM DELIRIUM TEST:  Feature One:   A. Acute Change in mental status from baseline? []  yes [x]  no   B. Is there fluctuation in the mental status? []  yes [x]  no    Feature Two:  A. Does that patient have difficulty focusing attention?  []  yes [x]  no    Feature Three:  A.Is there evidence of disorganized thinking?  []  yes [x]  no    Feature Four:  A. Does the patient have an altered level of consciousness (ie: vigilant, lethargic, stuporous, comatous?  []  yes [x]  no    The diagnosis of delirium by CAM requires the presence of features 1 and 2 and either 3 or 4     LABS:  Na/K/Cl/CO2/BUN/Cr/glu:  137/4.0/101/25/51/2.42/73 (04/06 0714)  Ca/Mg/PO4:  9.5/1.3/4.5 (04/06 9629)  WBC/Hgb/Hct/Plts:  140.03/10.0/32.2/213 (04/05 1928-04/06 5284)     PT/INR/APTT/Fib:  17.7/1.5/52.4/258 (04/06 1324)    MICROBIOLOGY:  Blood    Urine    Respiratory    DIAGNOSTIC IMAGING AND STUDIES:  CXR, 05/09/19:    IMPRESSION:  ?  Stable cardiomediastinal silhouette with mildly tortuous, calcified thoracic aorta. Persistent mild cardiomegaly.  Normal lung volumes. Increased density of the bilateral lower lung zones is accentuated due to overlying breast implants.  There are bilateral lower lobe patchy consolidations, most suggestive of pulmonary edema versus pneumonia.   No right pleural effusion. Trace left pleural effusion. No pneumothorax. Elevation of the left hemidiaphragm  No acute osseous abnormalities.    Assessment and Plan   Gwendolyne Welford is a 85 y.o. 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  ?  #Lightheadness, POA:  #Systolic Murmur c/f Aortic Stenosis: Symptoms appears to be orthostatic in the setting of diltiazem, though orthostatics notably negative in ED. Patient reports improvement being off diltiazem. However, query component of deconditioning. Systolic murmur on exam c/f AS but doubt symptoms 2/2 valve disease.  []  hold home diltiazem 180mg  and clonidine 0.1mg  TID  []  fall precautions  []  PT/OT consult  []  TTE to assess heart and valvular function  ?  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 Suspect hypervolemia due to renal insufficiency. BNP mildly elevated. No acid/base disorder noted on 4/5 VBG. Now s/p IVF and IV Lasix 20mg  x2. UA without significant pyuria.  -Consider renal consult in AM if Cr not improving  -Follow-up UA, UNa, UCr   -Trend Cr daily  -Supplemental O2; continous O2 monitoring  ?  #Hyperleukocytosis  #History of chronic myelomonocytic leukemia type-1  WBC 127k on admission with 4% blasts. Appears significantly elevated compared to baseline, though last recorded WBC from 2019 (~75k). Patient has not received treatment previously, and notably CMML is not driven by BCR-ABL mutation. Patient declining bone marrow biopsy; prefers to resume care at home in [place]. Suspect increase in WBC reflects proression of disease rather than acute infection, though will have low threshold to start antibiotics, noting PCN allergy (  hives). Elevated procalcitonin (1.12) difficult to interpret in setting of high circulating cytokines from heme malignancy.  []  re-consult hematology (previously saw patient in [place] ED)  - low threshold for leukapheresis if worsening pulmonary or neurologic symptoms (or other e/o end-organ damage) c/f for leukostasis (will discuss with heme/onc) --> would need HD line  - will discuss possible cytoreduction therapies with patient and heme/onc: hydroxyurea vs. chemotherapy  -Trend TLS labs (uric acid, phos, K, iCa) q6h  -Trend DIC labs (INR, PTT, fibrinogen, D-dimer, platelets  --> maintain platelets >30lk  -Aggressive hydration (2cc/kg per hour --> 100cc/h in this patient)  -Follow-up hematology malignancy panel  -Follow-up peripheral blood flow cytometry    #Elevated INR, POA:  INR 1.9 on admission. This raises c/f synthetic liver dysfunction due to leukostasis or DIC, which can occur in up to 40% of patients with hyperleukocytosis. Reassuringly, however, other DIC labs WNL (other than D-dimer), and other liver labs WNL (including albumin at 3.5). Consider nutritional deficiency.  []  will consider vit K administration after discussion with heme/onc  []  f/u abdominal ultrasound to assess for hepatosplenomegaly  ?  #Anemia, POA: Hgb 8.9. Likely 2/2 renal disease and CMML. Iron studies not c/w iron deficiency anemia (ferritin 640, 28% transferrin saturation). Received Epo treatment as outpatient.  -Trend CBC daily  -Discuss epo with renal    #Hypertension:   Hold home diltiazem 180mg  and clonidine 0.1mg  TID given possibly contributing to lightheadedness and dizziness. Will spot dose with hydralazine and if BP persistently >160 will consider standing amlodipine.  - hydral 25mg  PO q6h prn  ?  Chronic/Stable Medical Problems  #Subclinical hypothyrodism: TSH 5.2 on admission  -Continue levothyroxine , on alternating days   []  f/u TSH  ?  #HLD:   -Continue rosuvastatin 20mg     #Peripheral neuropathy:  -Continue gabapentin but dose reduce to 100mg  at bedtime (from 200mg ) given worsening kidney function and dizziness    Inpatient Checklist  Diet: renal (low K, low phos)  GI ppx: home PPI  DVT ppx: heparin subq  Lines: pIV  Tubes/Drains: none    CODE STATUS: Full Code  Primary Emergency Contact: Margot Ables [phone])    Disposition:  TBD (pending PT/OT eval)    Ambulatory Status & Fall Risk  []  non-ambulatory  []  walks with cane and/or walker  [x]  walks independently  []  walks independently but may benefit from an assistive device evaluation with physical therapy  []  needs supervision with walking secondary to poor safety awareness    Discharge Considerations:  [x]  recommend PT evaluation to see if appropriate for skilled nursing facility   [x]  recommend increase level of care/supervision then patient currently has at home  []  recommend 24 hour supervision  []  recommend home health services  []  after stabilization patient may return to prior living situation as it is adequate to meet his/her needs  []  recommend new assistive device for ambulation, safety, and fall risk  [x]  will re-evaluate later in the hospital course once further stabilized    Patient will be discussed with geriatrics attending Dr. Early Osmond.    Signed,  Henderson Cloud, MD/MBA  Internal Medicine, PGY-3  [phone]    Attending Addendum:  I have personally interviewed and examined the patient with Dr. Wendie Chess on the date of service. All labs, studies, and medications were independently reviewed by me. I have reviewed the note above and agree with the history, physical, assessment and plan which we formulated together. The patient/family/caregiver(s) were informed of the above plan,  demonstrated understanding and was agreeable to the plan as written above. I have the following additions/corrections to the note:     Annie Saephan is a 85 y.o. 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 She was found to be in acute hypoxic respiratory failure with progression of  leukocytosis, elevated LDH and uric acid level, and acute on chronic kidney injury, concern for impending tumor lysis syndrome  - Consult Heme/Onc  - Consider conservative treatment options such as oral meds as patient would like to defer treatment to her primary Heme/Onc in [place]  - Nephrology consult  - follow labs: DIC and TLS  - Dispo: to return to [place] with her daughter    Greater than 50% of today's 75 minute visit was spent counseling the patient and family regarding the above medical conditions, treatment options and management expectations and in coordination of care with staff.    Aveyah Greenwood L. Lennice Sites, MD  Geriatrics Attending

## 2019-05-11 NOTE — Consults
CASE MANAGER ASSESSMENT      Admit [id number]    Date of Initial CM Assessment: 05/11/2019    Chart Review ONLY    Problems: Active Problems:    Hyperleukocytosis POA: Yes       Past Medical History:   Diagnosis Date   ? CKD (chronic kidney disease)    ? GERD (gastroesophageal reflux disease)    ? History of CVA (cerebrovascular accident) 2011   ? 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 Hyperlipidemia    ? Hypertension    ? Hypothyroidism    ? Leukemia (HCC/RAF)    ? Myelodysplasia (myelodysplastic syndrome) (HCC/RAF)     Past Surgical History:   Procedure Laterality Date   ? CESAREAN SECTION     ? MASTECTOMY Bilateral    ? TOTAL ABDOMINAL HYSTERECTOMY            Primary Care Physician:Buckley, Chrissie Noa, MD  [phone]  NEEDS ASSESSMENT:     Level of Function Prior to Admit: Self Care/Indep. W ADLs    Current Living Situation: Lives w/Family    Number of medications: 6 to 10          Support Systems: Children    Contact Name: Lorina Duffner Phone Number: [phone]       DPOA?: Other (Comment)(temp AD on file)       Living Accommodation: Home/Apartment                       Home Health Services: No             Other Therapies Prior to Admit: None                        Rental DME/O2 in Home: No               DME Owned by Patient: Other (Comment)     Do you have your Primary Care Doctor's office number?: Yes  How often do you visit your doctor?: Annual    How will you be transported to your doctor's appointment?: Family transportation     Do you need information/education regarding your medical condition?: No         Verbalized financial concerns?: No       Were you hospitalized in the last 30 days?: No      DISCHARGE ASSESSMENT:     Projected Date of Discharge: 05/14/2019    Anticipated Complex D/C?: No    Projected Discharge to: Other (Comment)    Projected Discharge Needs: Home Health                        Support Identified at Discharge: Child  Name of Support Person: Talajah Slimp Phone Number: [phone]       Who is available to transport you upon discharge?: Family Transportation           Georgiann Hahn, [place] BSN CCM 05/11/2019   Senior Case Manager, Department of Care Coordination and Clinical Social Work     Office: [phone]                           Fax: [phone]  Pager: [phone]

## 2019-05-11 NOTE — Consults
SPRITUAL CARE CONSULTATION NOTE    PATIENT:  Lindsay Brennan  MRN:  [id number]     Patient Info        Religious/Spiritual Identity:        Mina Marble       Last Anointed Date:                 Baptised:                 Spiritual Care Visit Details              Date of Visit:  05/11/19  Time of Visit:  1020  Visited with Patient   Visit length 15 Minutes   Referral source Nurse on admission   Reason for visit Initial visit/assessment      Spiritual Assessment     Spiritual practices & resources Personal faith/Spiritual beliefs, Family/Friends   Areas of spiritual/emotional distress Adjustment to illness/hospitalization, Concerns for health and healing, Feelings of ...   Distressful feelings Not applicable on this visit   Indicators of spiritual wellbeing Takes initiative in own spiritual life, Expresses...   Expressions of spiritual wellbeing Expresses desire to get well      Plan     Spiritual care intervention Addressed emotional concerns/distress, Introduction to chaplain services   Outcomes (per patient/family) Appreciated visit   Spiritual care plans Continue to visit as needed   Additional comments .      Recommendation           Author:  Santina Evans 05/11/2019 10:40 AM  Contact info: SM pager: [phone] ext: [phone]

## 2019-05-11 NOTE — Nursing Note
Pt is aox4, cooperative and pleasant. RA, cont pulse ox only. No complaints of pain  But complains of fatigue when ambulating. IV fluids, LR at 75cc/hr continuous. Bed alarm on, call light within reach, bed rails up x2.

## 2019-05-12 ENCOUNTER — Ambulatory Visit: Payer: PRIVATE HEALTH INSURANCE

## 2019-05-12 ENCOUNTER — Ambulatory Visit: Payer: MEDICARE

## 2019-05-12 LAB — Prothrombin Time Panel
INR: 1.4 s (ref 11.5–14.4)
INR: 1.5 s (ref 11.5–14.4)
PROTHROMBIN TIME: 17.4 s — ABNORMAL HIGH (ref 11.5–14.4)

## 2019-05-12 LAB — CBC
ABSOLUTE NUCLEATED RBC COUNT: 2.05 10*3/uL — ABNORMAL HIGH (ref 0.00–0.00)
MEAN CORPUSCULAR HEMOGLOBIN: 28.2 pg (ref 26.4–33.4)
MEAN CORPUSCULAR HEMOGLOBIN: 28.8 pg (ref 26.4–33.4)
MEAN CORPUSCULAR VOLUME: 91.1 fL (ref 79.3–98.6)
RED BLOOD CELL COUNT: 3.54 x10E6/uL — ABNORMAL LOW (ref 3.96–5.09)
WHITE BLOOD CELL COUNT: 135.39 10*3/uL — ABNORMAL HIGH (ref 4.16–9.95)

## 2019-05-12 LAB — Uric Acid
URIC ACID: 8.3 mg/dL — ABNORMAL HIGH (ref 2.9–7.0)
URIC ACID: 8.1 mg/dL — ABNORMAL HIGH (ref 2.9–7.0)
URIC ACID: 8.2 mg/dL — ABNORMAL HIGH (ref 2.9–7.0)

## 2019-05-12 LAB — Basic Metabolic Panel
CREATININE: 2.45 mg/dL — ABNORMAL HIGH (ref 0.60–1.30)
GFR ESTIMATE FOR AFRICAN AMERICAN: 21 mL/min/{1.73_m2} (ref 65–99)
SODIUM: 135 mmol/L (ref 135–146)
TOTAL CO2: 23 mmol/L (ref 20–30)
TOTAL CO2: 23 mmol/L (ref 20–30)
UREA NITROGEN: 52 mg/dL — ABNORMAL HIGH (ref 7–22)

## 2019-05-12 LAB — Phosphorus
PHOSPHORUS: 3.3 mg/dL (ref 2.3–4.4)
PHOSPHORUS: 5.1 mg/dL — ABNORMAL HIGH (ref 2.3–4.4)
PHOSPHORUS: 4.2 mg/dL (ref 2.3–4.4)

## 2019-05-12 LAB — Fibrinogen
FIBRINOGEN: 268 mg/dL (ref 235–490)
FIBRINOGEN: 234 mg/dL — ABNORMAL LOW (ref 235–490)
FIBRINOGEN: 233 mg/dL — ABNORMAL LOW (ref 235–490)

## 2019-05-12 LAB — APTT
APTT: 46.7 s — ABNORMAL HIGH (ref 24.4–36.2)
APTT: 52.1 s — ABNORMAL HIGH (ref 24.4–36.2)
APTT: 55.2 s — ABNORMAL HIGH (ref 24.4–36.2)

## 2019-05-12 LAB — D-Dimer
D-DIMER STAGO: 2.53 ug{FEU}/mL — ABNORMAL HIGH (ref <0.60)
D-DIMER STAGO: 2.39 ug{FEU}/mL — ABNORMAL HIGH (ref <0.60)
D-DIMER STAGO: 2.47 ug{FEU}/mL — ABNORMAL HIGH (ref <0.60)

## 2019-05-12 LAB — Calcium,Ionized
IONIZED CA++,CORRECTED: 1.22 mmol/L (ref 1.09–1.29)
IONIZED CA++,CORRECTED: 1.19 mmol/L (ref 1.09–1.29)
IONIZED CA++,UNCORRECTED: 1.19 mmol/L (ref 1.09–1.29)

## 2019-05-12 LAB — Albumin/Creatinine Ratio,Urine: ALBUMIN/CREATININE RATIO: 377.3 ug/mg — ABNORMAL HIGH (ref <30.0)

## 2019-05-12 LAB — Differential, Manual: MONOCYTE: 18 (ref 0.2–0.8)

## 2019-05-12 LAB — Flow Cytometry

## 2019-05-12 MED ADMIN — ROSUVASTATIN CALCIUM 10 MG PO TABS: 20 mg | ORAL | @ 03:00:00 | Stop: 2019-06-10 | NDC 60687024501

## 2019-05-12 MED ADMIN — MELATONIN 3 MG PO TABS: 3 mg | ORAL | @ 05:00:00 | Stop: 2019-05-19

## 2019-05-12 MED ADMIN — LACTATED RINGERS IV SOLN: 75 mL/h | INTRAVENOUS | @ 22:00:00 | Stop: 2019-05-14 | NDC 00338011704

## 2019-05-12 MED ADMIN — GABAPENTIN 100 MG PO CAPS: 100 mg | ORAL | @ 03:00:00 | Stop: 2019-05-19 | NDC 60687058001

## 2019-05-12 MED ADMIN — HEPARIN SODIUM (PORCINE) 5000 UNIT/ML IJ SOLN: 5000 [IU] | SUBCUTANEOUS | @ 17:00:00 | Stop: 2019-05-22 | NDC 00641040012

## 2019-05-12 MED ADMIN — LEVOTHYROXINE SODIUM 50 MCG PO TABS: 50 ug | ORAL | @ 18:00:00 | Stop: 2019-06-11 | NDC 51079044020

## 2019-05-12 MED ADMIN — HEPARIN SODIUM (PORCINE) 5000 UNIT/ML IJ SOLN: 5000 [IU] | SUBCUTANEOUS | @ 03:00:00 | Stop: 2019-05-22 | NDC 00641040012

## 2019-05-12 MED ADMIN — ACETAMINOPHEN 325 MG PO TABS: 650 mg | ORAL | @ 05:00:00 | Stop: 2019-05-13 | NDC 50580045811

## 2019-05-12 MED ADMIN — ASPIRIN 81 MG PO CHEW: 81 mg | ORAL | @ 17:00:00 | Stop: 2019-05-19 | NDC 66553000201

## 2019-05-12 MED ADMIN — ACETAMINOPHEN 325 MG PO TABS: 650 mg | ORAL | @ 18:00:00 | Stop: 2019-05-13 | NDC 50580045811

## 2019-05-12 MED ADMIN — ROSUVASTATIN CALCIUM 20 MG PO TABS: 20 mg | ORAL | @ 03:00:00 | Stop: 2019-05-19 | NDC 60687024501

## 2019-05-12 MED ADMIN — MAGNESIUM OXIDE 400 (240-241.3 MG) MG (MULTI-GPI) PO TABS: 400 mg | ORAL | @ 17:00:00 | Stop: 2019-06-09 | NDC 64980033901

## 2019-05-12 MED ADMIN — LIDOCAINE 5 % EX PTCH: 1 | TRANSDERMAL | @ 18:00:00 | Stop: 2019-05-19 | NDC 00591352530

## 2019-05-12 MED ADMIN — LIDOCAINE 5 % EX PTCH: 1 | TRANSDERMAL | @ 03:00:00 | Stop: 2019-05-19

## 2019-05-12 MED ADMIN — PANTOPRAZOLE SODIUM 40 MG PO TBEC: 40 mg | ORAL | @ 17:00:00 | Stop: 2019-06-09 | NDC 50268063915

## 2019-05-12 MED ADMIN — HYDROXYUREA 500 MG PO CAPS: 500 mg | ORAL | @ 13:00:00 | Stop: 2019-05-14 | NDC 00904693961

## 2019-05-12 NOTE — Consults
Inpatient Hematology/Oncology Consultation    Patient name:  Lindsay Brennan  MRN:  [id number]  DOB:  [birth year]  Location: 5228/1  Date of service:  05/12/2019    Reason for consultation: CMML  Primary service: Geriatrics   Referring provider: Iran Planas, MD  PCP: Iran Planas, MD  Outpatient hematologist/oncologist (if any): Dr. Osvaldo Human of [place] [phone]    History of Presenting Illness:  Lindsay Brennan is a 85 y.o. 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 Per the patient, lightheadedness has time correlation with diltiazem, improving with decrease in the dosage, and only occurring when she rises from seated position. She was planning to go back home to [place] but mentioned dizziness to her family who reportedly found her O2 saturation to be 80's at home, prompting admission. She says she only has dyspnea when lying flat, but improves in supine. She also endorses cough for the past 2 days. COVID negative.   ?  [facility], evaluated by oncology service, started on IVF and allopurinol for uric acid 11.8. other notable labs include LDH 1426, d-dimer 2.4, fibrinogen 271, PT 20.4    CXR demonstrated bilateral lower lobe consolidations  abd [place] with marked splenomegaly  ?  [facility].   Patient reports weight loss, occasional night sweats, early satiety. Continues to have dizziness and SOB with exertion.     05/12/19 started hydrea 500mg  BID yesterday. Breathing and dizziness is stable    ROS:  A complete 14 point review of systems has been is negative except for as documented.    Past Medical History:  Past Medical History:   Diagnosis Date   ? CKD (chronic kidney disease)    ? GERD (gastroesophageal reflux disease)    ? History of CVA (cerebrovascular accident) 2011   ? 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 Hyperlipidemia    ? Hypertension    ? Hypothyroidism    ? Leukemia (HCC/RAF)    ? Myelodysplasia (myelodysplastic syndrome) (HCC/RAF)        Past Surgical History:  Past Surgical History:   Procedure Laterality Date   ? CESAREAN SECTION     ? MASTECTOMY Bilateral    ? TOTAL ABDOMINAL HYSTERECTOMY         Social History:  Social History     Tobacco Use   ? Smoking status: Never Smoker   ? Smokeless tobacco: Never Used   Substance Use Topics   ? Alcohol use: Not Currently     Family History:  Family History   Problem Relation Age of Onset   ? Stroke Mother    ? Emphysema Father    ? Ovarian cancer Sister    ? Heart attack Brother    ? Lung cancer Sister    ? Lung cancer Sister    ? Colon cancer Brother    ? Colon cancer Son        Allergies:  Allergies   Allergen Reactions   ? Oxycodone-Acetaminophen Other (See Comments)   ? Penicillins Hives   ? Sulfa Antibiotics Agranulocytosis   ? Naproxen Rash     Aleve       Outpatient Medications:  Facility-Administered Medications Prior to Admission   Medication Dose Route Frequency Provider  Last Rate Last Admin   ? [DISCONTINUED] epoetin alfa-epbx (Retacrit) 10,000 units/mL inj 20,000 Units  20,000 Units Subcutaneous Once Boonpheng, Boonphiphop, MD         Medications Prior to Admission   Medication Sig Dispense Refill Last Dose   ? aspirin 81 mg EC tablet Take 81 mg by mouth daily.   05/09/2019 at Unknown time   ? cloNIDine 0.1 mg tablet Take 0.1 mg by mouth two (2) times daily.      ? dilTIAZem (CARDIZEM CD) 180 mg 24 hr capsule Take 180 mg by mouth daily.      ? epoetin alfa-epbx (RETACRIT) 10,000 units/mL injection Inject 20,000 Units under the skin Every month.      ? esomeprazole 40 mg capsule Take 40 mg by mouth daily .   05/09/2019 at Unknown time   ? Ferric Citrate 1 GM 210 MG(Fe) TABS Take 1 tablet by mouth three (3) times daily with meals. 270 tablet 1    ? gabapentin 100 mg capsule Take 200 mg by mouth at bedtime .   05/09/2019 at Unknown time   ? rosuvastatin 20 mg tablet Take 20 mg by mouth at bedtime .   05/10/2019 at Unknown time   ? sodium bicarbonate 650 mg tablet Take 1 tablet (650 mg total) by mouth two (2) times daily. 60 tablet 2 05/09/2019 at Unknown time   ? SYNTHROID 50 mcg tablet Take 50 mcg by mouth every other day Pt alternates with 75mg .      ? SYNTHROID 75 mcg tablet Take 75 mcg by mouth every other day Pt alternates with 50mg .   05/10/2019 at Unknown time   ? allopurinol 100 mg tablet Take 2 tablets (200 mg total) by mouth three (3) times daily. (Patient not taking: Reported on 05/11/2019.)   Not Taking at Unknown time   ? bisacodyl 5 mg EC tablet Take 1 tablet (5 mg total) by mouth daily as needed for Constipation. 100 tablet 0    ? heparin 5000 unit/mL injection Inject 1 mL (5,000 Units total) under the skin every twelve (12) hours.      ? hydrALAZINE 10 mg tablet Take 1 tablet (10 mg total) by mouth every six (6) hours.      ? lactated ringers IV soln Inject 50 mL/hr into the vein continuous. (Patient not taking: Reported on 05/11/2019.)   Not Taking at Unknown time   ? pantoprazole 40 mg DR tablet Take 1 tablet (40 mg total) by mouth daily.      ? polyethylene glycol powder packet Take 1 packet (17 g total) by mouth daily.      ? [DISCONTINUED] senna 8.6 mg tablet Take 1 tablet by mouth at bedtime as needed for Constipation. (Patient not taking: Reported on 05/11/2019.)   Not Taking at Unknown time     Inpatient Medications:  Scheduled Meds:  ? aspirin  81 mg Oral Daily   ? ferrous sulfate  325 mg Oral Every Other Day   ? gabapentin  100 mg Oral QHS   ? heparin  5,000 Units Subcutaneous Q12H   ? hydroxyurea  500 mg Oral BID   ? levothyroxine  50 mcg Oral Every Other Day   ? levothyroxine  75 mcg Oral Every Other Day   ? lidocaine  1 patch Transdermal Q24H   ? magnesium oxide  400 mg Oral Daily   ? pantoprazole  40 mg Oral Daily   ? rosuvastatin  20 mg Oral QHS  Continuous Infusions:  ? lactated ringers 75 mL/hr (05/12/19 1433)     PRN Meds:.acetaminophen, bisacodyl, hydrALAZINE, magnesium hydroxide, melatonin, senna    Vital signs:  Vitals:    05/12/19 0700 05/12/19 0809 05/12/19 1114 05/12/19 1510   BP:  169/71 163/67 164/65   Patient Position:  Sitting     Pulse: 81 83 90 92   Resp:  18 18 17    Temp:  36.4 ?C (97.5 ?F) 36.6 ?C (97.9 ?F) 36.3 ?C (97.4 ?F)   TempSrc:  Temporal Axillary Temporal   SpO2: 99% 97% 94% 95%   Weight:       Height:         Vital sign ranges (24h):  Temp:  [36.3 ?C (97.4 ?F)-36.7 ?C (98.1 ?F)] 36.3 ?C (97.4 ?F)  Heart Rate:  [77-93] 92  Resp:  [17-19] 17  BP: (157-178)/(65-86) 164/65  NBP Mean:  [96-110] 98  SpO2:  [93 %-99 %] 95 %    Intake/Output (24h):    Intake/Output Summary (Last 24 hours) at 05/12/2019 1649  Last data filed at 05/12/2019 1505  Gross per 24 hour   Intake 3247.25 ml   Output 1400 ml   Net 1847.25 ml       Physical Exam:  General: Appears well-developed, well-nourished and close to stated age.   CV: Regular in rate and rhythm, no murmurs or gallops.   Chest: Clear to auscultation bilaterally without wheezing or rhonchi. No crackles noted. Respiratory effort appears normal.   Abdomen: Soft, nontender and nondistended. Bowel sounds are present and normoactive. +splenomegaly  Musculoskeletal: No edema. No cyanosis. Extremities are warm and well-perfused.   Psychiatric: Affect appropriate.  Pleasant and conversant.     Labs:  Results for orders placed or performed during the hospital encounter of 05/10/19   CBC   Result Value Ref Range    White Blood Cell Count 133.22 (H) 4.16 - 9.95 x10E3/uL    Red Blood Cell Count 3.06 (L) 3.96 - 5.09 x10E6/uL    Hemoglobin 8.8 (L) 11.6 - 15.2 g/dL    Hematocrit 09.8 (L) 34.9 - 45.2 %    Mean Corpuscular Volume 91.5 79.3 - 98.6 fL    Mean Corpuscular Hemoglobin 28.8 26.4 - 33.4 pg    MCH Concentration 31.4 (L) 31.5 - 35.5 g/dL    Red Cell Distribution Width-SD 73.5 (H) 36.9 - 48.3 fL    Red Cell Distribution Width-CV 23.0 (H) 11.1 - 15.5 %    Platelet Count, Auto 212 143 - 398 x10E3/uL    Mean Platelet Volume      Nucleated RBC%, automated 1.5 No Ref. Range %    Absolute Nucleated RBC Count 2.05 (H) 0.00 - 0.00 x10E3/uL    Neutrophil Abs (Prelim) 76.31 See Absolute Neut Ct. x10E3/uL   CBC & Auto Differential    Narrative    The following orders were created for panel order CBC & Auto Differential.  Procedure                               Abnormality         Status                     ---------                               -----------         ------  ZOX[[id number]]                          Abnormal            Final result               Differential, Automated[[id number]]                                                       Please view results for these tests on the individual orders.        Lab Results   Component Value Date    INR 1.5 05/12/2019    PT 17.0 (H) 05/12/2019     Results for orders placed or performed during the hospital encounter of 05/10/19   Basic Metabolic Panel   Result Value Ref Range    Sodium 135 135 - 146 mmol/L    Potassium 3.8 3.6 - 5.3 mmol/L    Chloride 101 96 - 106 mmol/L    Total CO2 22 20 - 30 mmol/L    Anion Gap 12 8 - 19 mmol/L    Glucose 106 (H) 65 - 99 mg/dL    Creatinine 8.11 (H) 0.60 - 1.30 mg/dL    GFR Estimate for African American 21 See GFR Additional Information mL/min/1.71m2    GFR Estimate for Non-African American 19 See GFR Additional Information mL/min/1.33m2    GFR Additional Information See Comment     Urea Nitrogen 53 (H) 7 - 22 mg/dL    Calcium 9.1 8.6 - 91.4 mg/dL     Results for orders placed or performed during the hospital encounter of 05/09/19   Hepatic Funct Panel   Result Value Ref Range    Total Protein 7.1 6.1 - 8.2 g/dL    Albumin 3.5 (L) 3.9 - 5.0 g/dL    Bilirubin,Total 0.6 0.1 - 1.2 mg/dL    Bilirubin,Conjugated 0.2 <=0.3 mg/dL    Alkaline Phosphatase 145 (H) 37 - 113 U/L    Aspartate Aminotransferase 32 13 - 47 U/L    Alanine Aminotransferase 14 8 - 64 U/L Pertinent imaging:  05/10/19 abd US  IMPRESSION:  1. Marked splenomegaly.  Mild hepatomegaly.  2. Borderline right hydronephrosis.  ?  05/09/19 CXR  IMPRESSION:  ?  Stable cardiomediastinal silhouette with mildly tortuous, calcified thoracic aorta. Persistent mild cardiomegaly.  Normal lung volumes. Increased density of the bilateral lower lung zones is accentuated due to overlying breast implants.  There are bilateral lower lobe patchy consolidations, most suggestive of pulmonary edema versus pneumonia.   No right pleural effusion. Trace left pleural effusion. No pneumothorax. Elevation of the left hemidiaphragm  No acute osseous abnormalities.  ?    Pertinent pathology:  Patlent Name: DOCIA, KLAR Accession#: [id number] Med. Rec. # [id number] Taken: 10/30/2016 Encounter #: [id number] Received: 10/30/2016 Gender: F Reported: 11/04/2016 16:53 DOB: 1934/07/26 (Age: 89)  Submitting Physician: Hubert Azure D M.D.  Additional Physician(s):  Location: [facility]  Clinical Information:  85 year old with remota history of mild pancytopenia in 2008. There ara no recent history or CBC studies.  Specimen Received:  A: Bone marrow,aspirate for clot section, pic,cyto sent to iu B: Bone marrow, biopsy (decalelfied),Ipic  ?  Final  Pathologic Diagnosis:  id B. Bone marrow, bio and aspirate for clot section, Jeft posterioriliac  crest: Myeloid neoplasm. See note.  As the senior physician, ~ attest that I: (i) examined the relevant preparation(s) for the specimen(s); and (ii) rendered or confirmed the diagnosis(es).  ?Electronically Signed ngz1/10/30/2016 Jobie Quaker, M.D. (Pathologist)  Deanne Coffer, M.D. 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 Clinical correlation and correlation with cytogenetics/FISH study including BCR-ABL7 fusion study is recommended for further classification.  Dr. 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  ?  Cellularity: Markedly hypercellular (> 90%) Myslopoiesis: Markedly increased with full maturation Erythropoiesis: Decreased Megakaryopoiesis: Markedly increased with frequent hypolobated/monolobated megakaryocytes including small hyperchromatic forms Additional studles: Reticulin stain shows mild increasein reticulin fibrosis (1+) with appropriate control,  Theimmunohistechemical stains are performedin this tase and same of the antigens may also ba evaluated by flow cytometry, Concument immunchistechemieal stains on tissue sections are Indicated in order to correlate immunophenotype with cell morphology, evaluate spatial dletribution/architectural features and/or for a quantitation of disease involvement  Bone marrow,aspirate smear:  Sample quality: Adequate  ME ratio: Markedly increased, ~ 8:1 Blasts: Not increased (4%) ' Myelopoiesis: Markedly increased with occasional hypolobated neutrophils Erythropoiesis: Decreased with megaloblastoid changes Megakaryopoiesis: Markedly increased with frequent hypolobated/monolobated megakaryocytes Comments: No clusters of blasts, no Auer rods. Additional studies: Iron stain performed at the [facility] showsincreased storage iron with no ring sideroblasts. Control stains appropriately,  Bone marrow differential:  Percent  Blasts 2  Promyeltocytes 7  Myelocytes 23  Metamyelocytes 6  Bands 16  Polys 30  Lymphocytes 3  Monocytes 2  Eosinophils 4  Basophils 1  Nucleated RBCs 10  Peripheral blood, smear:  CBC: WBC 46 k/cumm, hemoglobIn 10,7 g/dL, MCV 81 fL, ROW 11.9%, platelet count 316 k/cumm Differential: Neutrophils 57%, lymphocytes 5%, monocytes 21%, myelocytas 3%, Metamyelocytes 8%, band 6%  RBCs: Normocytic anaemia with anlsopgikilocytosis. WEBCs: Leucocytosis, granulocytosis with left-shift, monocytosis. Platelets: Normal platelet caunt with occasional giantplatelets  ?  Results-Comments  CD34 and p53 stains show rare CD34+ blasts (<5%) and exceedingly rare p53+ cells. Controls stain appropriately.  ?  INTERERETATION: (600 cell diff)  ?  Bone Marrow Aspirate specimen is adequate and is markedly hypercellular. Spicules are present.  Megakaryocytes Are increaged in numberwith occasional small mono/hypolobated forms and rare small separated lobe forms identified. My elopoiesis Is abnormal, increased, and proceeds to completion. Blasts and promonocytes comprise 6% of total nucleated cells. 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 The M/E ratio is 8.871.  Erythroid series Shows occasional megaloblastoid changes.  Lymphoid Lymphocytes comprise 2% of total nucleated cells,  Comments: Correlate with bone marrow biopsy and cytogenetic studies,  Tron Stain Results: +2/6 Iron in reticulum, no ringed formsidentified.  Control: positive.  IMPRESSION: DATESIGNED OUT: 11/01/2016 TIME: 10:10 CMML-1.  COMMENT: The marrow biopsy was not available at the time of finalizing the aspirate report. In the event of a marrow biopsy report with discrepant results an addendum will be provided by [facility].  ?  My blast count was 8% ( 300 cell differential) with 15% monocytes; trilineage dysplasia; increased numbers of megakaryocytes and 90-100% cellular particles.  Becauseofthe high WBC I did my counting very close to the particles to void any dilution effect from the peripheral blood.  1% blasts in PBS and 19% monocytes.  JMB      Impression and Recommendations:  Elanore Talcott is a 85 y.o. female with CMML  admitted for dizziness. We are asked to consult regarding management of hyperleukocytosis.    Patient with symptomatic hyperleukocytosis. I recommended we start hydroxyurea 500mg  BID (50% dose reduction for CrCl<60)  Spoke with patients primary hematologist Dr. Marcellus Scott who is in agreement with hydrea. Would wish to avoid HMA given her age. Anticipate to see improvement in WBC within 24-48 hours. Suspect her dizziness and hypoxia are a result of elevated WBC, however I do not think she needs urgent leukopheresis give hypoxia is improving.  We will consider leukopheresis if symptoms do not improve   Continue IVF and allopurinol for elevated uric acid  Follow up flow cytometry results    05/12/19 continue hydrea 500mg  BID. Reviewed flow cytometry results with hemepath Dr. Reginia Forts - approximately 2% blasts, numerous neutrophils and monocytes. Continue allopurinol and TLS labs. 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 We will continue to follow the patient.    Inetha Maret, D.O.  Hematology/Oncology

## 2019-05-12 NOTE — Consults
Occupational Therapy Evaluation      PATIENT: Lindsay Brennan  MRN: [id number]  DOB: 26-May-1934    ADMIT DATE: 05/10/2019       Date of Evaluation: 05/12/2019    Problems: Active Problems:    Hyperleukocytosis POA: Yes       Past Medical History:   Diagnosis Date   ? CKD (chronic kidney disease)    ? GERD (gastroesophageal reflux disease)    ? History of CVA (cerebrovascular accident) 2011   ? 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 Hyperlipidemia    ? Hypertension    ? Hypothyroidism    ? Leukemia (HCC/RAF)    ? Myelodysplasia (myelodysplastic syndrome) (HCC/RAF)     Past Surgical History:   Procedure Laterality Date   ? CESAREAN SECTION     ? MASTECTOMY Bilateral    ? 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 CXR4/05/2019 notable for BLL patchy consolidation suggestive of pulmonary edema vs. PNA. Per heme/onc rec, patient's hyperleukocytosis treated with hydroxyurea.    Patient Stated Goal:  Pt. would like to return home     Living Arrangements   Type of Home: House(currently stays w/ daughter in 1 level apt, elevator access)  Home Layout: One level, Stairs to enter without rails  # Stairs to enter: 1  Bathroom Shower/Tub: Electrical engineer: Hand-held shower  Home Equipment: None  Additional Comments: Tripped/fell on speed bump approximately 2 weeks ago    Prior Level of Function   Level of Independence: Independent, Community ambulation(was walking 1-2 mi  for exercise for severeal mos now since caring for son who passed d/t colon CA and COVID)  Lives With: Alone  Support Available: Family  # of hours available: daughter prn as currently unemployed  ADL Assistance: Independent, Activities of Daily Living  Homemaking Assistance: Needs assistance  Vocation: Retired(worked in Arboriculturist)  Vision: Reading, Ecologist, Glasses  Hearing: Within Systems developer  Transportation: Drives Self    Precautions   Precautions: Dance movement psychotherapist;Check Labs  Orthotic: None  Current Activity Order: Activity as tolerated  Weight Bearing Status: Not Applicable  Additional Weight Bearing Status: Not Applicable      GENERAL EVALUATION   Patient Presentation: Up in chair;HLIV;Chair alarm on    Bed Mobility   Supine Scooting: Not Performed  Rolling: Not Performed  Supine to Sit: Not Performed(Pt. received in chair and left in chair)  Sit to Supine: Not Performed    Functional Transfers   Sit to Stand: Contact Guard Assist  Transfer: From;Chair;To;Toilet  Level of Assist: Contact Guard Assist  Type of Transfer: Stand step;Front wheeled Barista Transfers: Advertising account executive Assist;Verbal Cueing;Assistive Device(FWW)  Functional Mobility: Contact Guard Assist;Front wheeled walker;Verbal Cueing(1-2 LOB without FWW, able to catch herself with the wall rail.)    Activities of Daily Living (ADLs)   Eating: Not performed  Grooming: Performed  Grooming Assistance: Risk analyst  Grooming Deficit: Standing with assistive device  Grooming Adaptive Equipment: None  Grooming Where Assessed: Standing sinkside  Bathing: Not performed  UB Dressing: Not performed  LB Dressing: Performed  LB Dressing Assistance:  Supervised(B socks)  LB Dressing Adaptive Equipment: None  LB Dressing Where Assessed: Chair  Toileting: Performed  Toileting Assistance: Print production planner Deficit: Health visitor up;Perineal hygiene  Toileting Adaptive Equipment: Grab bar(FWW)  Toileting Where Assessed: Toilet    Balance   Sitting - Static: Good  Sitting - Dynamic: Good     RUE Assessment   RUE Assessment: Within Functional Limits(shoulder flex ~100) LUE Assessment   LUE Assessment: Within Functional Limits(shoulder flex ~90)              Hand Function   Gross Grasp: Functional  Coordination: Functional      Sensation   Sensation: Grossly intact(BUE grossly intact. Pt. reported neuropathy in her toes)    Cognition   Cognition: Within Defined Limits  Safety Awareness: Good awareness of safety precautions  Barriers to Learning: None    Vision   Complex Visual Assessment: Not performed         Pain Assessment   Patient complains of pain: No                             Patient Status   Activity Tolerance: Good  Oxygen Needs: Room Air  Response to Treatment: Tolerated treatment well  Compliance with Precautions: Not Applicable  Call light in reach: Yes  Presentation post treatment: Up in chair;Lines/drains intact;Chair alarm on  Comments: Cleared by RN(ngozi). Pt. seen with PT(Jamie) for part of the session. Pt. agreeable to OT eval. Pt. toleared mobility well. Pt. voided in the toilet. Pt. left up in chair with all needs.    Interdisciplinary Communication   Interdisciplinary Communication: Nurse;Physical Therapist      ASSESSMENT   Rehab Potential: Good  Inpatient Recommendation: OT treatment  Problem List: Pain limiting ADLs;Decreased functional transfers;Decreased self care skills;Impaired functional endurance;Decreased functional balance  Treatment Plan: ADL training;Patient and/or family education;Energy conservation;Discharge planning;Functional balance activities;Functional transfer training;Equipment evaluation training  Frequency: 3-5 x/week  Duration (days): 30  Progress Note Due Date: 05/19/19      Goals Discussed With: Patient    Short Term Goals to be achieved in: 7 days  Pt will groom self: with modified independence  Pt will toilet self: with set up  Pt will dress upper body: independently  Pt will dress lower body: independently  Pt will perform: stand step transfer;independently    Long Term Goals to be achieved in: 30 days  Pt will be: safe and independent performing self care activities    OT Recommendations   Discharge Recommendation: Physical Therapy;1-3 x/week  Type of therapy: Home Health  Supervision Recommended on Discharge: 12-24 hrs/day;Initially, wean supervision as tolerated  Discharge Equipment Recommended: Junior;Walker      Evaluation Completed by: Fanny Skates, OT,  05/12/2019

## 2019-05-12 NOTE — Progress Notes
DAILY GERIATRIC PROGRESS NOTE    DATE OF SERVICE: 05/12/2019  HOSPITAL DAY: 2  CHIEF COMPLAINT: No chief complaint on file.    ID: [id number] with history of CKD 4, HTN, and CMML presenting with lightheadedness and SOB, found to have hyperleukocytosis (WBC >100k), for which she is admitted.    INTERVAL EVENTS:  - TLS labs stable/improving  --> uric acid 12.8 --> 8.1  - DIC labs stable/improving  --> INR 1.5  - Creatinine stable but above baseline  - seen by heme/onc: starting hydroxyurea 500mg  BID for hyperleukocytosis  - seen by renal: L pelviectasis seen on renal u/s likely 2/2 old (passed) kidney stone; consider ACEi for HTN and will do CT KUB if Cr >3.0, otherwise no recs  - TTE pending    SUBJECTIVE:  - NAEO, slept well, tolerating PrimaFit  - Otherwise no new cardiac, respiratory, or GI symptoms.    MEDICATIONS:  Scheduled:  aspirin, 81 mg, Oral, Daily  ferrous sulfate, 325 mg, Oral, Every Other Day  gabapentin, 100 mg, Oral, QHS  heparin, 5,000 Units, Subcutaneous, Q12H  hydroxyurea, 500 mg, Oral, BID  levothyroxine, 50 mcg, Oral, Every Other Day  levothyroxine, 75 mcg, Oral, Every Other Day  lidocaine, 1 patch, Transdermal, Q24H  magnesium oxide, 400 mg, Oral, Daily  pantoprazole, 40 mg, Oral, Daily  rosuvastatin, 20 mg, Oral, QHS  PRN:  acetaminophen, bisacodyl, hydrALAZINE, magnesium hydroxide, melatonin, senna  Infusions:  ? lactated ringers 75 mL/hr (05/11/19 1148)       PHYSICAL EXAM:  Temp:  [36.4 ?C (97.5 ?F)-36.7 ?C (98.1 ?F)] 36.4 ?C (97.5 ?F)  Heart Rate:  [73-93] 83  Resp:  [17-19] 18  BP: (157-178)/(65-86) 169/71  NBP Mean:  [96-110] 104  SpO2:  [93 %-99 %] 97 %  I/O: I/O last 2 completed shifts:  In: 2127.5 [P.O.:580; I.V.:1447.5; IV Piggyback:100]  Out: 2100 [Urine:2100]    General:   Well-appearing in NAD   Eyes:   PERRL, EOM grossly intact, sclerae and conjunctivae anicteric and non-injected bilaterally with no pallor or exudates.   Ears:   Hearing grossly intact   Nose/Mouth:  MMM without exudates, ulcers, or petechiae.   Neck:  Normal range of motion. No JVD   Lymph:   No anterior/posterior cervical or submandibular LAD.   Lungs:   No increased WOB, no dullness to percussion, good air movement bilaterally, mild crackles at L lung base, otherwise CTAB with no RWR.   Chest/Heart:   RRR, normal S1 S2, crescendo-decrescendo 2/6 systolic murmur heard best at 2nd R intercostal space, otherwise no MGR.    Abdomen:   Non-distended. No tenderness or masses on palpation, no rebound or guarding. +Splenomegaly   GU:   Not indicated.   Skin/integument:  No rashes, lesions, or breakdown. Fingernails with normal contour, color, and lunulae.   Extremities:   No cyanosis, clubbing, or edema.   Neuro:   Alert and interactive, cranial nerves II-XII grossly intact, ambulates with shuffling gait without assistance       CAM DELIRIUM TEST:  Feature One:   A. Acute Change in mental status from baseline? []  yes [x]  no   B. Is there fluctuation in the mental status? []  yes [x]  no    Feature Two:  A. Does that patient have difficulty focusing attention?  []  yes [x]  no   (days of week, serial 7s)    Feature Three:  A.Is there evidence of disorganized thinking?  []  yes [x]  no    Feature Four:  A. Does the patient have an altered level of consciousness (ie: vigilant, lethargic, stuporous, comatous?  []  yes [x]  no    The diagnosis of delirium by CAM requires the presence of features 1 and 2 and either 3 or 4      DATA:  I have reviewed the following information from the last 24 hours: allied health and treating physician notes, imaging, labs and microbiology data and cardiac studies and telemetry data:    Lab Results   Component Value Date    WBC 135.39 (H) 05/12/2019    HGB 8.8 (L) 05/12/2019    HCT 28.8 (L) 05/12/2019    PLT 226 05/12/2019     Lab Results   Component Value Date    NA 137 05/12/2019    K 3.7 05/12/2019    CL 101 05/12/2019    CO2 23 05/12/2019    BUN 52 (H) 05/12/2019    CREAT 2.37 (H) 05/12/2019    GLUCOSE 79 05/12/2019 CALCIUM 9.3 05/12/2019    MG 1.3 (L) 05/11/2019    PHOS 5.1 (H) 05/12/2019     Lab Results   Component Value Date    APTT 52.1 (H) 05/12/2019    PT 17.4 (H) 05/12/2019    INR 1.5 05/12/2019     Lab Results   Component Value Date    ALT 14 05/10/2019    AST 32 05/10/2019    BILITOT 0.6 05/10/2019    ALKPHOS 145 (H) 05/10/2019    ALBUMIN 3.5 (L) 05/10/2019     Lab Results   Component Value Date    TSH 5.5 (H) 05/10/2019     Lab Results   Component Value Date    CHOL 77 (L) 01/18/2019    CHOLHDL 19 (L) 01/18/2019    TRIGLY 165 (H) 01/18/2019       MICRO:  Covid-19 PCR negative, 05/10/19    IMAGING:     Renal ultrasound, 05/11/19:  IMPRESSION:  ?  Mild right hydronephrosis, unchanged since prior.  Left pelviectasis.    Abdominal ultrasound, 05/11/19:  IMPRESSION:  ?  1. Marked splenomegaly.  Mild hepatomegaly.  ?  2. Borderline right hydronephrosis.    CXR, 05/10/19:  IMPRESSION:  ?  Stable cardiomediastinal silhouette with mildly tortuous, calcified thoracic aorta. Persistent mild cardiomegaly.  Normal lung volumes. Increased density of the bilateral lower lung zones is accentuated due to overlying breast implants.  There are bilateral lower lobe patchy consolidations, most suggestive of pulmonary edema versus pneumonia.   No right pleural effusion. Trace left pleural effusion. No pneumothorax. Elevation of the left hemidiaphragm  No acute osseous abnormalities.    ?  Pertinent pathology:  Patlent Name: Lindsay, DESHAZER Accession#: [id number] Med. Rec. # [id number] Taken: 10/30/2016 Encounter #: [id number] Received: 10/30/2016 Gender: F Reported: 11/04/2016 16:53 DOB: [birth year] (Age: 69)  Submitting Physician: Hubert Azure D M.D.  Additional Physician(s):  [facility]  Clinical Information:  85 year old with remota history of mild pancytopenia in 2008. There ara no recent history or CBC studies.  Specimen Received:  A: Bone marrow,aspirate for clot section, pic,cyto sent to iu B: Bone marrow, biopsy (decalelfied),Ipic  ?  Final  Pathologic Diagnosis:  id B. Bone marrow, bio and aspirate for clot section, Jeft posterioriliac crest: Myeloid neoplasm. See note.  As the senior physician, ~ attest that I: (i) examined the relevant preparation(s) for the specimen(s); and (ii) rendered or confirmed the diagnosis(es).  ?Electronically Signed ngz1/10/30/2016 Jobie Quaker, M.D. (Pathologist)  Deanne Coffer, M.D. (  Resident) Signing Location: [facility], [address], [place], [place] [address]  Note:  The majordifferential diagnosis includes chronic myelomonocytic leukemia. Clinical correlation and correlation with cytogenetics/FISH study including BCR-ABL7 fusion study is recommended for further classification.  Dr. 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  ?  Cellularity: Markedly hypercellular (> 90%) Myslopoiesis: Markedly increased with full maturation Erythropoiesis: Decreased Megakaryopoiesis: Markedly increased with frequent hypolobated/monolobated megakaryocytes including small hyperchromatic forms Additional studles: Reticulin stain shows mild increasein reticulin fibrosis (1+) with appropriate control,  Theimmunohistechemical stains are performedin this tase and same of the antigens may also ba evaluated by flow cytometry, Concument immunchistechemieal stains on tissue sections are Indicated in order to correlate immunophenotype with cell morphology, evaluate spatial dletribution/architectural features and/or for a quantitation of disease involvement  Bone marrow,aspirate smear:  Sample quality: Adequate  ME ratio: Markedly increased, ~ 8:1 Blasts: Not increased (4%) ' Myelopoiesis: Markedly increased with occasional hypolobated neutrophils Erythropoiesis: Decreased with megaloblastoid changes Megakaryopoiesis: Markedly increased with frequent hypolobated/monolobated megakaryocytes Comments: No clusters of blasts, no Auer rods. Additional studies: Iron stain performed at the [facility] showsincreased storage iron with no ring sideroblasts. Control stains appropriately,  Bone marrow differential:  Percent  Blasts 2  Promyeltocytes 7  Myelocytes 23  Metamyelocytes 6  Bands 16  Polys 30  Lymphocytes 3  Monocytes 2  Eosinophils 4  Basophils 1  Nucleated RBCs 10  Peripheral blood, smear:  CBC: WBC 46 k/cumm, hemoglobIn 10,7 g/dL, MCV 81 fL, ROW 60.4%, platelet count 316 k/cumm Differential: Neutrophils 57%, lymphocytes 5%, monocytes 21%, myelocytas 3%, Metamyelocytes 8%, band 6%  RBCs: Normocytic anaemia with anlsopgikilocytosis. WEBCs: Leucocytosis, granulocytosis with left-shift, monocytosis. Platelets: Normal platelet caunt with occasional giantplatelets  ?  Results-Comments  CD34 and p53 stains show rare CD34+ blasts (<5%) and exceedingly rare p53+ cells. Controls stain appropriately.  ?  INTERERETATION: (600 cell diff)  ?  Bone Marrow Aspirate specimen is adequate and is markedly hypercellular. Spicules are present.  Megakaryocytes Are increaged in numberwith occasional small mono/hypolobated forms and rare small separated lobe forms identified. My elopoiesis Is abnormal, increased, and proceeds to completion. Blasts and promonocytes comprise 6% of total nucleated cells. 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 The M/E ratio is 8.871.  Erythroid series Shows occasional megaloblastoid changes.  Lymphoid Lymphocytes comprise 2% of total nucleated cells,  Comments: Correlate with bone marrow biopsy and cytogenetic studies,  Tron Stain Results: +2/6 Iron in reticulum, no ringed formsidentified.  Control: positive.  IMPRESSION: DATESIGNED OUT: 11/01/2016 TIME: 10:10 CMML-1.  COMMENT: The marrow biopsy was not available at the time of finalizing the aspirate report. In the event of a marrow biopsy report with discrepant results an addendum will be provided by [facility].  ?  My blast count was 8% ( 300 cell differential) with 15% monocytes; trilineage dysplasia; increased numbers of megakaryocytes and 90-100% cellular particles.  Becauseofthe high WBC I did my counting very close to the particles to void any dilution effect from the peripheral blood.  1% blasts in PBS and 19% monocytes.  JMB    ASSESSMENT/PLAN:   Lindsay Brennan is a 85 y.o. 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  ?  #Symptomatic Hyperleukocytosis   #History of chronic myelomonocytic leukemia type-1  Patient's lightheadedness and SOB may be secondary to WBC >100k, though other than elevated Cr no e/o end-organ damage. Blasts <5% (4% on admission) making blast crisis less likely, though patient declining bone marrow bx to determine extent of blast cells in bone marrow. Patient has not received treatment previously, and notably CMML is not driven by BCR-ABL mutation. Evaluated by hematology/oncology who recommend hydroxyurea (dose-reduced for eGFR <60); if no improvement in WBC or worsening in symptoms will consider leukapheresis. Toxicities of hypo-methylating agents (treatment of choice in MDS/CMML) likely outweigh benefits in this 85yo patient, and regardless patient hopes to return to [place] for consideration of more aggressive treatment once she is stabilized. 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 Of note, no e/o of infection to explain marked increase in WBC from prior baseline (previously 45k in October 2020), though will have low threshold to start antibiotics, noting PCN allergy (hives). Elevated procalcitonin (1.12) difficult to interpret in setting of high circulating cytokines from heme malignancy.  []  heme/onc consulted, appreciate recs:  - hydroxyurea 500mg  (50% dose-reduced given eGFR <60) started 4/6  --> if no significant improvement in WBC by 4/8, will consider leukapheresis (or if symptoms worsen)  -Trend TLS labs (uric acid, phos, K, iCa) q8h  -Trend DIC labs (INR, PTT, fibrinogen, D-dimer, platelets) q8h  --> maintain platelets >30lk  -Aggressive hydration (1.5cc/kg per hour --> 75cc/h in this patient)  --> IV Lasix 20mg  prn if worsening oxygenation or high positive fluid balance on strict I/Os  -Follow-up hematology malignancy panel  -Follow-up peripheral blood flow cytometry    #Acute hypoxic respiratory failure:  #Stage IV CKD:  #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 Suspect hypervolemia due to renal insufficiency. BNP mildly elevated. No acid/base disorder noted on 4/5 VBG. UA without significant pyuria. L pelviectasis on renal ultrasound but per renal this is likely sequelae of prior nephrolithiasis.  []  renal consulted, appreciate recs:  - recommend restarting ACEi for long-term benefit in CKD  - trend Cr daily  - supplemental O2; continous O2 monitoring    #Lightheadness, POA:  #Systolic Murmur c/f Aortic Stenosis:  Symptoms appears to be orthostatic in the setting of diltiazem, though orthostatics notably negative in ED. Patient reports improvement being off diltiazem. However, query component of deconditioning. Systolic murmur on exam c/f AS but doubt symptoms 2/2 valve disease.  []  hold home diltiazem 180mg  and clonidine 0.1mg  TID  []  fall precautions  []  PT/OT consult  []  TTE to assess heart and valvular function  ?  #Elevated INR, POA, improving:  INR 1.9 on admission. This raises c/f synthetic liver dysfunction due to leukostasis or DIC, which can occur in up to 40% of patients with hyperleukocytosis. Reassuringly, however, other DIC labs WNL (other than D-dimer), and other liver labs WNL (including albumin at 3.5). Consider nutritional deficiency. Query consumptive coagulopathy given splenomegaly. INR now improving.  []  CTM, will consider vit K administration if increasing INR, after discussion with heme/onc  ?  #Anemia, POA: Hgb 8.9. Likely 2/2 renal disease and CMML. Iron studies not c/w iron deficiency anemia (ferritin 640, 28% transferrin saturation). Received Epo treatment as outpatient.  - trend CBC daily  - no indication for epo on this admission  ?  #Hypertension:   Hold home diltiazem 180mg  and clonidine 0.1mg  TID  given possibly contributing to lightheadedness and dizziness. Will spot dose with hydralazine and if BP persistently >160 will consider standing amlodipine.  - hydral 25mg  PO q6h prn  []  consider ACEi as above prior to discharge once Cr stabilizes  ?  Chronic/Stable Medical Problems  #Subclinical hypothyrodism: TSH 5.2 on admission  -Continue levothyroxine , on alternating days   ?  #HLD:   -Continue rosuvastatin?20mg   ?  #Peripheral neuropathy:  -Continue gabapentin but dose reduce to 100mg  at bedtime (from 200mg ) given worsening kidney function and dizziness  ?  Inpatient Checklist  Diet: renal (low K, low phos)  GI ppx: home PPI  DVT ppx: heparin subq  Lines: pIV  Tubes/Drains: none  ?  CODE STATUS: Full Code  Primary Emergency Contact: Margot Ables [phone])  ?  Disposition:  TBD (pending PT/OT eval)  ?  Ambulatory Status & Fall Risk  [] ? non-ambulatory  [] ? walks with cane and/or walker  [x] ? walks independently  [] ? walks independently but may benefit from an assistive device evaluation with physical therapy  [] ? needs supervision with walking secondary to poor safety awareness  ?  Discharge Considerations:  [x] ? recommend PT evaluation to see if appropriate for skilled nursing facility   [x] ? recommend increase level of care/supervision then patient currently has at home  [] ? recommend 24 hour supervision [] ? recommend home health services  [] ? after stabilization patient may return to prior living situation as it is adequate to meet his/her needs  [] ? recommend new assistive device for ambulation, safety, and fall risk  [x] ? will re-evaluate later in the hospital course once further stabilized    This patient has an advanced directive:  Yes []   No [x]  Unknown []     This patient's designated decision maker if or when patient lacks capacity is: Daughter Kim  Code Status (designated by patient): Full Code    Discussed with attending, Dr. Andrey Cota., MD    Signed,  Henderson Cloud, MD/MBA  Internal Medicine, PGY-3  [phone]    Attending Addendum:  I have personally interviewed and examined the patient with Dr. Wendie Chess on the date of service. All labs, studies, and medications were independently reviewed by me. I have reviewed the note above and agree with the history, physical, assessment and plan which we formulated together. The patient/family/caregiver(s) were informed of the above plan, demonstrated understanding and was agreeable to the plan as written above.     Greater than 50% of today's 50 minute visit was spent counseling the patient and family regarding the above medical conditions, treatment options and management expectations and in coordination of care with staff.    Lydell Moga L. Lennice Sites, MD  Geriatrics Attending

## 2019-05-12 NOTE — Consults
Physical Therapy Evaluation      PATIENT: Lindsay Brennan  MRN: [id number]  DOB: 1934/03/29    ADMIT DATE: 05/10/2019       Date of Evaluation: 05/12/2019    Problems: Active Problems:    Hyperleukocytosis POA: Yes       Past Medical History:   Diagnosis Date   ? CKD (chronic kidney disease)    ? GERD (gastroesophageal reflux disease)    ? History of CVA (cerebrovascular accident) 2011   ? 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 Hyperlipidemia    ? Hypertension    ? Hypothyroidism    ? Leukemia (HCC/RAF)    ? Myelodysplasia (myelodysplastic syndrome) (HCC/RAF)     Past Surgical History:   Procedure Laterality Date   ? CESAREAN SECTION     ? MASTECTOMY Bilateral    ? 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 CXR notable for BLL patchy consolidation suggestive of pulmonary edema vs. PNA. Per heme/onc rec, patient's hyperleukocytosis treated with hydroxyurea 500 mg BID.     Patient Stated Goal: Get legs stronger, get back to walking for exercise     Living Arrangements   Type of Home: House(currently stays w/ daughter in 1 level apt, elevator access)  Home Layout: One level, Stairs to enter without rails  # Stairs to enter: 1  Bathroom Shower/Tub: Electrical engineer: Hand-held shower  Home Equipment: None  Additional Comments: Tripped/fell on speed bump approximately 2 weeks ago    Prior Level of Function   Level of Independence: Independent, Community ambulation(was walking 1-2 mi  for exercise for severeal mos now since caring for son who passed d/t colon CA and COVID)  Lives With: Alone  Support Available: Family  # of hours available: daughter prn as currently unemployed  ADL Assistance: Independent, Activities of Daily Living  Homemaking Assistance: Needs assistance  Vocation: Retired(worked in Arboriculturist)  Vision: Reading, Ecologist, Glasses  Hearing: Within Systems developer  Transportation: Drives Self    Precautions   Precautions: Dance movement psychotherapist;Check Labs  Orthotic: None  Current Activity Order: Activity as tolerated  Weight Bearing Status: Not Applicable  Additional Weight Bearing Status: Not Applicable      GENERAL EVALUATION   Patient Presentation: Up in chair;HLIV;Chair alarm on    Bed Mobility   Supine to Sit: Not Performed(up in chair at start/end of session)    Functional Mobility   Sit to Stand: Contact Guard Assist(anticipate would have been SBA up to FWW; decreased eccentric control for stand to sit d/t LE weakness)  Ambulation: Minimum Assist  Ambulation Distance (Feet): 200'  Gait Pattern: Unsteady;Decreased pace;Decreased heel-toe(LOB w/ scissoring step to recover, min A from PT to prevent LOB)  Assistive Device: None(rec Jr. FWW for ambulation)  Stairs: Not Applicable     Balance   Standing - Dynamic: Poor;with UE support       LE Assessment   RLE Assessment: Gross Assessment  hip flex 3/5, knee ext 3+/5, ankle df 3+/5              LLE Assessment: Gross Assessment  hip flex 3/5, knee ext 3+/5, ankle df 3+/5              Sensation   Sensation: Grossly intact    Cognition   Cognition: Within Defined Limits  Safety Awareness: Good awareness of safety precautions  Barriers to Learning: None(slight difficulty w/ word-finding, though fully alert, oriented and appropriate)        Pain Assessment   Patient complains of pain: No          Patient Status   Activity Tolerance: Good  Oxygen Needs: Room Air  Response to Treatment: Tolerated treatment well(feeling weak)  Compliance with Precautions: Not Applicable  Call light in reach: Yes  Presentation post treatment: Up in chair;Lines/drains intact(w/ OT)  Comments: Patient demo generalized weakness, deconditioning that started w/ diminishing activity over last several months, further compounded by recent dizziness, lightheadedness and SOB. Today, patient feeling well, no c/o symptoms prompting admission, but mildly unsteady ambulating without AD. Recommend routine ambulation w/ nursing using FWW, up in chair for meals until DC.    Interdisciplinary Communication   Interdisciplinary Communication: Nurse      ASSESSMENT   Rehab Potential: Good  Inpatient Recommendation: PT treatment  Problem List: Decreased bed mobility;Decreased transfers;Decreased gait;Decreased strength;Decreased endurance;Impaired balance;Fall risk;Discharge needs  Treatment Plan: Bed mobility training;Transfer training;Gait training;Therapeutic exercise;Balance training;Patient and/or family education;Discharge planning  Frequency: 3-5 x/week  Duration (days): 30  Progress Note Due Date: 05/19/19      Goals Discussed With: Patient    Short Term Goals to be achieved in: 7 days  Pt will perform bed mobility: with supervision  Pt will transfer to/from bed/chair: with supervision  Pt will ambulate: > 200 feet;with FWW;without assistive device;with stand by assist  Pt will perform home exercise program: with minimum assist;with verbal cues    Long Term Goals to be achieved in: 30 days  Pt will ambulate: > 200 feet;without assistive device;with stand by assist    PT Recommendations   Discharge Recommendation: Physical Therapy;1-3 x/week  Type of therapy: Home Health(transition to OP as soon as possible)  Supervision Recommended on Discharge: 8-12 hrs/day;Initially, wean supervision as tolerated  Discharge concerns: Requires supervision for mobility;Requires assistance for self care  Discharge Equipment Recommended: Junior;Walker      Evaluation Completed by: Elease Etienne, PT,  05/12/2019

## 2019-05-12 NOTE — Consults
RENAL CONSULTATION INPATIENT  Referring Physician: Early Osmond  CC: CKD    05/11/2019     History of Present Illness  85 y.o. F with proteinuric CKD admitted for dizziness and shortness of breath, found to have hyperleukocytosis.     Over the past few years the creatinine has gradually trended up into the 2s. A few years ago a nephrologist in [place] (her regular nephrologist) advised her to stop lisinopril in the setting of getting a colonoscopy, ''because the kidney disease is very advanced''. She had undergone a renal artery angiogram which showed nonobstructive atherosclerosis. There is mention of rapidly progressive kidney disease but the records I was able to see in care everywhere show a more slow rise in creatinine over the past few years.     She has been visiting her daughter [name] (who is at the bedside) for the past several months and during this time has been following the CKD clinic in [place]. They recently started her on diltiazem for the proteinuria which correlated with onset of the dizziness, though the dilt was reduced, and then stopped prior to this admission, but she does not feel any better.     She does report a remote hx of kidney stones assoc with renal colic. However there have been no recent events of this.     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     She has a hx of HTN     Past Medical History  Past Medical History:   Diagnosis Date   ? CKD (chronic kidney disease)    ? GERD (gastroesophageal reflux disease)    ? History of CVA (cerebrovascular accident) 2011   ? 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 Hyperlipidemia    ? Hypertension    ? Hypothyroidism    ? Leukemia (HCC/RAF)    ? Myelodysplasia (myelodysplastic syndrome) (HCC/RAF)      Past Surgical History:   Procedure Laterality Date   ? CESAREAN SECTION     ? MASTECTOMY Bilateral    ? TOTAL ABDOMINAL HYSTERECTOMY         Social and Family Hx  Social History Socioeconomic History   ? Marital status: Widowed     Spouse name: Not on file   ? Number of children: Not on file   ? Years of education: Not on file   ? Highest education level: Not on file   Occupational History   ? Not on file   Social Needs   ? Financial resource strain: Not on file   ? Food insecurity     Worry: Not on file     Inability: Not on file   ? Transportation needs     Medical: Not on file     Non-medical: Not on file   Tobacco Use   ? Smoking status: Never Smoker   ? Smokeless tobacco: Never Used   Substance and Sexual Activity   ? Alcohol use: Not Currently   ? Drug use: Never   ? Sexual activity: Not on file   Lifestyle   ? Physical activity     Days per week: Not on file     Minutes per session: Not on file   ? Stress: Not on file   Relationships   ? Social Wellsite geologist on phone: Not on file     Gets together: Not  on file     Attends religious service: Not on file     Active member of club or organization: Not on file     Attends meetings of clubs or organizations: Not on file     Relationship status: Not on file   Other Topics Concern   ? Need Help Feeding Yourself? Not Asked   ? Need Help Getting Dressed? Not Asked   ? Need Help Using the Telephone? Not Asked   ? Need Help Managing Money? Tree surgeon, [employer]) Not Asked   ? Need Help Shopping for Groceries? Not Asked   ? Need Help Getting Places Beyond Walking Distance? [employer], [employer]) Not Asked   ? Need Help Getting from Bed to Chair? Not Asked   ? Need Help Bathing or Showering? Not Asked   ? Need Help Taking your Medications? Not Asked   ? Need Help Doing Moderately Strenuous Housework? (ex. Laundry) Not Asked   ? Need Help Driving? Not Asked   ? Need Help Getting to the Toilet? Not Asked   ? Need Help Walking Across the Road? (Includes [place]) Not Asked   ? Need Help Preparing Meals? Not Asked   ? Need Help Shopping for Personal Items? (Toiletries, Medicines) Not Asked   ? Need Help Climbing a Flight of Stairs? Not Asked   ? Do you live with someone who assists you at home? Not Asked   ? Do you get help from family members or friends in your home? Not Asked   ? Do you employ someone to provide health related care or help you in your home? Not Asked   ? Do you provide care for a family member? Not Asked   ? Does your home have rugs in the hallway? Not Asked   ? Does your home have poor lighting? Not Asked   ? Does your home lack grab bars in the bathroom? Not Asked   ? Does your home lack handrails on the stairs? Not Asked   ? Have you noticed any hearing difficulties? Not Asked   ? Do you currently participate in any regular activity to improve or maintain your physical fitness? Not Asked   ? Do you always wear a seatbelt when you ride in a car? Not Asked   ? If you drink alcohol, do you drink more than 7 drinks per week or more than 3 drinks on any given day? Not Asked   ? Has anyone ever been concerned about your drinking? Not Asked   ? Do you exercise at least a day, 3 or more days a week? No   ? Types of Exercise? (List in Comments) No   ? Do you follow a special diet? No   ? Vegan? No   ? Vegetarian? No   ? Pescatarian? No   ? Lactose Free? No   ? Gluten Free? No   ? Omnivore? No   Social History Narrative   ? Not on file     Family History   Problem Relation Age of Onset   ? Stroke Mother    ? Emphysema Father    ? Ovarian cancer Sister    ? Heart attack Brother    ? Lung cancer Sister    ? Lung cancer Sister    ? Colon cancer Brother    ? Colon cancer Son        ROS: 14-point ROS negative except as above    Medications  Allergies  Allergen Reactions   ? Oxycodone-Acetaminophen Other (See Comments)   ? Penicillins Hives   ? Sulfa Antibiotics Agranulocytosis   ? Naproxen Rash     Aleve     ? aspirin  81 mg Oral Daily   ? ferrous sulfate  325 mg Oral Every Other Day   ? gabapentin  100 mg Oral QHS   ? heparin  5,000 Units Subcutaneous Q12H   ? hydroxyurea  500 mg Oral BID   ? [START ON 05/12/2019] levothyroxine  50 mcg Oral Every Other Day   ? levothyroxine  75 mcg Oral Every Other Day   ? lidocaine  1 patch Transdermal Q24H   ? magnesium oxide  400 mg Oral Daily   ? pantoprazole  40 mg Oral Daily   ? rosuvastatin  20 mg Oral QHS     acetaminophen, bisacodyl, hydrALAZINE, magnesium hydroxide, melatonin, senna    EXAM  BP 178/86  ~ Pulse 93  ~ Temp 36.7 ?C (98.1 ?F) (Temporal)  ~ Resp 18  ~ Ht 1.549 m (5' 1'')  ~ Wt 47.5 kg (104 lb 11.2 oz)  ~ SpO2 93%  ~ BMI 19.78 kg/m?     Intake/Output Summary (Last 24 hours) at 05/11/2019 1955  Last data filed at 05/11/2019 1700  Gross per 24 hour   Intake 2127.5 ml   Output 1975 ml   Net 152.5 ml     General: NAD, well-developed  Eyes: Conjunctiva and lids normal; Pupils equal, Anicteric  ENMT: normal facial anatomy with no traumatic lesions; normal mucosa, no discharge  Neck: supple, trachea midline, no thyromegaly  Respiratory: Normal work of breathing, CTAB  Cardiovascular: regular, no added heart sounds; JVP normal; no edema  Abdomen: soft, NT/ND, no HSM  Vascular: symmetric 2+ pedal pulses; no ulcerations  Skin: warm and dry; normal coloration  Neuro: CN intact; moves all ext  Psych: alert, oriented; normal affect; appropriate judgement and insight    LABS  Lab Results   Component Value Date    CREAT 2.34 (H) 05/11/2019    BUN 51 (H) 05/11/2019    NA 135 05/11/2019    K 3.8 05/11/2019    CL 98 05/11/2019    CO2 22 05/11/2019       ASSESSMENT  85 y.o. delightful female with CKD of unknown cause, characterized by slow progression, subnephrotic proteinuria, and HTN. Admitted for dizziness possibly due to diltiazem but now with hyperleukocytosis.    1. CKD stage 4 with proteinuria and intermittent microhematuria, mild bump in creat v fluctuations due to leukostasis. Unclear cause.   2. 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 Coagulopathy   4. Hyponatremia mild   5. R hydro mild ? Significance. Not likely causing AKI. Could be sequella from prior stone   6. HTN  7. Prior kidney stones (Seen R side ultrasound 2020, per report) - treated with allopurinol for a time now off        RECOMMENDATIONS  Will defer to hematologist re indications for treatment of increased WBC but she is going to be started on hydroxyurea - will monitor symptoms and kidney function   For long-term treatment of CKD, RAAS blockade remains the treatment of choice. Depending on clinical course, might attempt to reintroduce this prior to dc (though benefit will only accrue with long-term treatment). K is normal though there is actually a hx of hypokalemia   Remain off dilt for now   For HTN treatment in the next 1-2  days, hydralazine is ok     Elevated risk of recurrent AKI:  - Avoid nephrotoxic injury  - Dose all medications to current eGFR    Thank you for involving me in the care of this pleasant and interesting patient.    Clementeen Graham, MD

## 2019-05-12 NOTE — Progress Notes
Pharmaceutical Services ? Admission Medication Reconciliation Note      Patient Name: Lindsay Brennan  Medical Record Number: [id number]  Admit date: 05/10/2019 5:49 PM    Age: 85 y.o.  Sex: female  Allergies: Oxycodone-acetaminophen, Penicillins, Sulfa antibiotics, and Naproxen  Height:   Most recent documented height   05/10/19 1.549 m (5' 1'')     Actual Weight:   Most recent documented weight   05/11/19 47.5 kg   05/10/19 49.4 kg     Diagnosis: The patient is currently admitted with the following concerns/issues: Active Problems:    Hyperleukocytosis POA: Yes      Reported Medication History   I spoke with the patient to update the home medication list for this hospital admission.    PTA Medication List (discrepancies are noted)   Prior to Admission medications as of 05/11/19 1042   Medication Sig Notes Last Dose   aspirin 81 mg EC tablet 81 mg, Oral, Daily  continued 05/09/2019 at Unknown time   cloNIDine 0.1 mg tablet 0.1 mg, Oral, 2 times daily  Held  - d/t lightheadedness and dizziness     dilTIAZem (CARDIZEM CD) 180 mg 24 hr capsule 180 mg, Oral, Daily  Held  - d/t lightheadedness and dizziness     epoetin alfa-epbx (RETACRIT) 10,000 units/mL injection 20,000 Units, Subcutaneous, Every month  Held     esomeprazole 40 mg capsule 40 mg, Oral, Daily  continued as Protonix  05/09/2019 at Unknown time   Ferric Citrate 1 GM 210 MG(Fe) TABS 1 tablet, Oral, 3 times daily with meals Held - Consider resume with patient's supply if clinically indicated     gabapentin 100 mg capsule 200 mg, Oral, Every night at bedtime  Dose reduced to 100mg  at bedtime  05/09/2019 at Unknown time   rosuvastatin 20 mg tablet 20 mg, Oral, Every night at bedtime  continued 05/10/2019 at Unknown time   sodium bicarbonate 650 mg tablet 650 mg, Oral, 2 times daily  Se med note below  05/09/2019 at Unknown time   SYNTHROID 50 mcg tablet 50 mcg, Oral, Every other day, Pt alternates with 75mg       SYNTHROID 75 mcg tablet 75 mcg, Oral, Every other day, Pt alternates with 50mg    05/10/2019 at Unknown time   allopurinol 100 mg tablet 200 mg, Oral, 3 times daily  Patient not taking: Reported on 05/11/2019.   Not Taking at Unknown time         Discharge Prescription Preference:   [facility] [phone] - [place], [place] - [address]  [address]  [place] [address]        The patient's medications have been reviewed and the PTA med list is updated.         Kirk Ruths, 05/11/2019, 10:43 AM    -------------------------------------------------------------------------------------------------------------------  I have reviewed the medication list compiled by the medication reconciliation pharmacy technician and agree with the findings.      Medication Notes  Please review the following medication notes (medication compliance or recent discontinuation related):   ? Diltiazem - per note, pt endorses lightheadedness whenever she gets up from sitting or laying down. Her symptoms started after diltiazem dose was increased from 180mg  > 240mg  > 300mg  daily on 03/29/19 --> she discussed her Sx with her nephrologist Dr. Everett Graff, and dose was decreased back to 180mg  daily on 05/03/2019.   ? Epoetin alfa - pt received Retacrit in place of Darbepoetin alfa while pending  prior authorization   ? Sodium bicarbonate - no external pharmacy pickup record available - prescribed in Jan 2021, but no dispensing record from [facility] - query adherence         Reconciliation  The assessment and reconciliation of admission and inpatient orders with PTA medication list is complete. There are no issues requiring follow up at this time.       Senaya Dicenso Orson Slick Annice Pih) Albin Felling, PharmD. BCPS  Clinical Pharmacist, Med Surg  Ext: [phone]

## 2019-05-12 NOTE — Nursing Note
Pt is aox4, cooperative and pleasant. RA, cont pulse ox only. No complaints of pain  But complains of fatigue when ambulating. IV fluids, LR at 75cc/hr continuous. Bed alarm on, call light within reach, bed rails up x2

## 2019-05-12 NOTE — Other
Patients Clinical Goal:   Clinical Goal(s) for the Shift: vss, pain management, fall precaution, sleep. IV fluids  Identify possible barriers to advancing the care plan:   Stability of the patient: Moderately Unstable - medium risk of patient condition declining or worsening    End of Shift Summary:   Pt is aox4, cooperative and pleasant. RA, cont pulse ox only. No complaints of pain ?But complains of fatigue when ambulating. IV fluids, LR at 75cc/hr continuous. Bed alarm on, call light within reach, bed rails up x2. Echo pending. Not acute events overnight.   Blood pressure 157/66, pulse 77, temperature 36.4 ?C (97.5 ?F), temperature source Temporal, resp. rate 19, height 1.549 m (5' 1''), weight 47.6 kg (104 lb 14.4 oz), SpO2 97 %.        GERIATRICS END OF SHIFT - DISCHARGE MARKERS of Instability Checklist  Nursing assessment:     Indicator   Cutoff Check if indicator needs to be addressed (meets cutoff)   Situation/Action/Comment   O2 requirement New since admission []     PO intake Less than 50% []     Urination None in past 8 hours/last shift []     Foley New since admission []     Pain Present  []     Bowel movements  <1 in 2 days or >3 in 24 hours []     Delirium Unable to follow simple commands or participate with PT/OT []     Mobility Unable to stand and/or walk to bathroom []  OOB to chair 1 times.  Ambulated 10 feet.       BOOST / SAFE TRANSITION - DISCHARGE INDICATORS    Indicator Check if indicator has red ''P''    Situation/Action/Comment     Problems with Medications  [x]     Psychological  []     Principal Diagnosis  []     Physical Limitations  []     Poor Health Literacy  []     Patient Support  []     Prior Hospitalizations  [x]     Palliative Care  []       DELIRIUM PREVENTION  Protocols Strategies Check if implemented Comments   Risk factors >3 present- High risk []     Purposeful orientation Reorient, purposeful orienting conversation  Familiar objects in room [x]   [x]     Therapeutic activities Volunteer visit Cognitive stimulation activities: games, reading, music []   [x]     Vision & hearing Assistance with: Leisure centre manager  Assistance with: hearing aids/hearing amplifier []   []     Feeding & hydration Assistance with feeding  Assistance with dentures []   []     Sleep hygiene Shades/blinds/lights on during day, limit naps during day  Quiet environment, consolidate care [x]     Mobilization BMAT 3-4: Ambulate TID  BMAT 2: OOB daily to chair for meals ? 2 hours, each time  BMAT 1: OOB to cardiac chair daily for meals ? 2 hours, each time []   [x]   []     Pain Non-narcotics ATC  Non-pharmacological: oil/aromatherapy, massage, music [x]   [x]     Maintain safety Fall precautions, volunteer visit, family at bedside, tele sitter, constant observer [x]     Manage agitation Redirect with calm, gentle voice and avoid confrontation  Avoid restraints and use alternative to restraints  Doll, music or animal therapy, as appropriate  Volunteer for companionship if safe and appropriate [x]   [x]   []   []       DELIRIUM: CAM (+)  Protocols Strategies Check if implemented Comments   New-onset MD contacted  Delirium order-set initiated  Bladder scan to R/O retention  Assess stool impaction  Medication reviewed with pharmacist []   []   []   []   []  MD Name:   Existing Manage and prevent further delirium []

## 2019-05-12 NOTE — Discharge Summary
Discharge Summary           Name: Lindsay Brennan MRN: [id number] DOB: 05/17/1934   Admit Date:   05/10/2019   D/C Date:  LOS:  LOS: 8 days    AdmittingAttending Lucia L. Lennice Sites, MD Discharge Attending Alba Cory, MD    PCP Salley Slaughter, MD Discharge Provider Ivor Costa. Wendie Chess, MD     Inpatient Treatment Team Treatment Team:   Team: Doylene Canning, Geriatrics -  Team: Consult, [facility]  Case Manager: Drema Dallas, RN  Primary - Resident: Carolynn Serve., MD  Case Manager: Hilary Hertz   Consults Hematology/Oncology, Nephrology, PT/OT   Outpatient Care Team No care team member to display     Admission Diagnosis (reason for admission) Discharge Diagnosis (conditions treated during hospitalization)   Symptomatic Hyperleukocytosis Hyperleukocytosis, AKI on CKD, HTN, Anemia     Disposition Discharge Condition   Home or Self care    stable     HPI: Lindsay Brennan?is a 85 y.o.?female?living with 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  ?  Patient endorses feeling lightheaded whenever she goes from sitting or laying to standing. Her symptoms started several weeks ago immediately after she started diltiazem 300mg  daily on 03/29/19. She discussed her symptoms with her nephrologist, Dr. Everett Graff, on 3/29, and her dose was decreased to 180mg  daily. This has resulted in an improvement of her symptoms, but they persist. She denies feeling dizzy while at rest. She endorses experiencing a fall approximately 1 week ago, but states it was mechanical (she endorses tripping over a speed bump), landing on her knees; she denies any headstrike.   ?  Patient has been hoping to return to [place] this week, but when she mentioned to family that she had been dizzy recently, and they checked her oxygen saturation. She remarks it was in the low-80's, prompting her to seek emergent care. She endorsed mild SOB when laying flat, which improves when supine. Endorses non-productive cough over the past two days. No fever/chills. Patient completed her COVID vaccination series; no known COVID contacts. She denies any lower extremity swelling. No nausea/vomiting/diarrhea.?  ?  [facility] Course:  Initial vitals:?T 36.7, HR 70, BP 141/59, SpO2 99%. Labs notable for WBC 126.9k, Hgb 8.9, Bicarb 19, Cr 2.53?(baseline appears 2.0-2.3), uric acid 11.8 (subsequently increased 12.8).?CXR with?bilateral lower lobe patchy consolidations, most suggestive of pulmonary edema versus pneumonia.?No therapeutics given.?  ?  [facility] Course: Patient arrived with dyspnea and labs notable for Hb 8.9, HCO3 19, Cr 2.53, and uric acid 12.8. CXR showed b/l lower lobe patchy consolidations, suggestive of pulmonary edema vs. Pneumonia. However, given patient denied fevers, chills, nausea, or cough, and c/f infection was low, antibiotics were deferred. Her lightheadedness was evident upon standing, however orthostatics were unremarkable. Diltiazem was stopped and she had one episode of hypertension, which resolved with Lasix and hydralazine 10mg . Hematology/Oncology was consulted given the extreme leukocytosis and concern that leukostasis could be contributing to pulmonary symptoms, and that high uric acid could be sign of tumor lysis syndrome (and causing urate nephropathy). Per their assessment, the absence of blast cells (<5%) in the peripheral blood does not suggest a blast crisis/ transformation to AML, though a bone marrow biopsy - which the patient declined - would be necessary to confirm or rule out the presence of blasts. They were concerned that elevation in WBC could reflect infection  though other than SOB patient did not have infectious s/s. They recommended admission for monitoring and potential treatment of TLS. She was started on IVF and allopurinol. Her oxygenation improved and Cr down-trended.  ?  Of note (per heme/onc note): Her oncologist is Dr. Hubert Azure from the [facility] [place] [phone]). Most recent bone marrow biopsy September 2018 with 6% blasts, c/w CMML type 1. At that time WBC was 46k, and Hb was 10.7. Last seen by Dr. Silvio Clayman on 04/30/2017 at which time she had WBC 73.5k (Hb 9.1). Through shared decision making patient opted to monitor for progression of disease and not pursue aggressive treatments.  ?  Most recent labs confirmed by [facility] nephrologist on-call, Dr. Kerry Hough [phone]):  - Hb 10.3  - platelets 177k  - WBC 45k  - Cr 1.8  - BUN 45    [facility] Course: See below for hospital course by problem, but briefly Ms. Terracciano presented with dizziness/lightheadedness and SOB and was found to be hypoxic with markedly elevated WBC count, c/f symptomatic hyperleukocytosis. 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 However, other TLS and DIC labs remained normal and uric acid improved with allopurinol and IV fluids, while INR improved without intervention (per heme/onc mild DIC due to factor X binding to circulating atypical monocytes; improved with down-trending monocyte count). Her symptoms improved with treatment of hyperleukocytosis with hydroxyurea; she did not require leukapheresis. More aggressive work-up and treatment, including bone marrow biopsy to assess for progression of CMML, and hypo-methylating agents for myelodysplasia, were deferred after discussion with the patient over the risks and benefits. She opted to return home to [place] and follow-up with her primary oncologist, Dr. Silvio Clayman [facility] [place], [phone]) after a convalescent period in [place], during which she'll be seen by nephrology, her PCP, and hematology/oncology. She was evaluated by PT/OT and discharged with Home Health therapies. Renal function improved to baseline with IVF and lowering of uric acid.   ?  #Symptomatic Hyperleukocytosis   #History of chronic myelomonocytic leukemia type-1  Patient's lightheadedness and SOB thought secondary to WBC >100k, though other than elevated Cr no e/o end-organ damage. Blasts <5% (4% on admission) making transformation to AML or blast crisis unlikely, though patient declined bone marrow bx to determine extent of blast cells in bone marrow. Patient has not received treatment previously, and notably CMML is not driven by BCR-ABL mutation. She was evaluated by hematology/oncology who started hydroxyurea (dose-reduced for eGFR <60) with excellent effect: WBC decreased from ~150k to ~15k on day of discharge. Toxicities of hypo-methylating agents (treatment of choice in MDS/CMML) thought to outweigh benefits in this 85yo patient, and regardless patient hopes to return to [place] before considering more aggressive treatment once she is stabilized. 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 Of note, there was no e/o of infection to explain marked increase in WBC from prior baseline (previously 45k in October 2020).  []  continue to follow with hematology/oncology (Dr. Master while in [facility], then Dr. Silvio Clayman upon return to [place])  - continue hydroxyurea 1g qam and 500mg  qpm  - repeat CBC in one week to monitor cell counts  - continue allopurinol 50mg  qday (this is max dose per pharmacy given eGFR)    #Acute hypoxic respiratory failure, resolved:  #HFpEF, new diagnosis:  BNP elevated on admission (532) with TTE on  05/13/19 showing grade I diastolic dysfunction. Pulmonary edema likely playing role in initial SOB/hypoxia, which improved with IV Lasix, though subsequently patient tolerated aggressive IVF. Discharged on RA.  - BP control as below  - repeat TTE in one year as below for AS  ?  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 No acid/base disorder noted on 4/5 VBG. UA without significant pyuria. L pelviectasis on renal ultrasound but per renal this is likely sequelae of prior nephrolithiasis. UMA 377mg /g. Renal function improved with IVF; Cr on day of discharge 1.44.  []  f/u Dr. Cyndie Chime (nephrologist) in 2-4 weeks  - continue allopurinol as above  - continue losartan 50mg  daily for proteinuria and HTN  []  repeat BMP, uric acid, phos, and iCa in two days (05/20/19)  ?  #Lightheadness, POA, resolved:  #Moderate Aortic Stenosis, new diagnosis:  Symptoms likely due to hyperluekocytosis, though query component of medication adverse effect (clonidine and diltiazem) as well as deconditioning. TTE on 05/13/19 showing moderate AS, though unlikely to be cause of symptoms.  []  decrease home diltiazem to 120mg  and STOP clonidine 0.1mg  TID  []  HH PT/OT ordered, and junior walker provided  []  repeat TTE April 2022 to assess for progression of AS  ?  #Elevated INR and aPTT, POA, improved:  INR 1.9 on admission. No e/o liver synthetic dysfucntion and low suspicion for nutritional deficit despite recent weight loss. Per hematology/oncology, INR and aPTT increase likely due to low-grade DIC due to high circulating atypical monocytes. INR improved prior to discharge with down-trending monocyte count.  - f/u with heme/onc as above  ?  #Anemia, POA, worsening: Hgb 8.9 on admission --> 7.3 on day of discharge (4/13). Chronic anemia likely 2/2 renal disease and CMML. Iron studies not c/w iron deficiency anemia (ferritin 640, 28% transferrin saturation). Acute worsening likely due to adverse effect of hydroxyurea. She was restarted on epo (retacrit) prior to discharge).   - continue epo supplementation (retacrit) 150u/kg T/Th/Sa  - continue home iron supplementation  []  repeat CBC in two days (05/20/19)  ?  #Cough, chronic  Patient notes several months of chronic cough of unclear etiology. Consider allergy related - started on loratadine on day of discharge.  -  lisinopril changed to losartan  - continue loratadine 5mg  daily  - continue guaifenesin prn  []  if no improvement, consider further evaluation as outpatient (PFTs, CT chest)  ?  #Hypertension, POA:   Initially held home diltiazem 180mg  and clonidine 0.1mg  TID given possibly contributing to lightheadedness and dizziness. Blood pressure has improved with below regimen and she reports no further episodes of dizziness.   - continue losartan 50mg  po qday --> up-titrate prn as outpatient  - continue diltiazem 120mg  qday  at lower dose (home dose is 180mg ) --> up-titrate prn as outpatient  ?  #L shoulder pain, chronic but worsening:  X-ray suggestive of chronic rotator cuff disease, however had TTP along L trapezius on exam suggestive of myofascial pain syndrome. [facility] consulted and patient received trigger point injection on 4/8 with subsequent improvement in symptoms, though patient did develop significant bruising at injection site, precluding any more injections during this admission.  - continue diclofenac 1% gel prn  - continue OTC lidocaine patches prn  - continue acetaminophen 650mg  q6h prn (not to exceed 3g daily)  []  if progression in symptoms, recommend PCP refer to [facility] for further trigger point injections  ?  Chronic/Stable Medical Problems  #Subclinical  hypothyrodism: TSH 5.2 on admission  - Continue levothyroxine , on alternating days   ?  #HLD:   - Continue rosuvastatin?20mg   ?  #Peripheral neuropathy, POA:  - Continue gabapentin 200mg  qhs    Current Hospital Problems           POA    Hyperleukocytosis Yes        I have seen and examined the patient and agree with the RD assessment detailed below:  Patient meets criteria for: Non-Severe (Moderate) Malnutrition    (current weight 47.9 kg (105 lb 8 oz), BMI (Calculated): 20; IBW: 47.6 kg (105 lb), % Ideal Body Weight: 100 %). See RD notes for additional details.      Procedures & Operations Performed: none    Relevant Imaging Studies (most recent results only):     TTE, 05/12/19:  CONCLUSIONS   1. Technically difficult study.   2. Normal left ventricular size.   3. Mild concentric left ventricular hypertrophy.   4. Left ventricular ejection fraction is approximately 60 to 65%.   5. Abnormal LV diastolic function (Grade I).   6. Moderate aortic valve stenosis with an aortic valve area of 1.15 cm? (index 0.67 cm?/m?).   7. Mild mitral valve regurgitation.   8. The peak TR velocity is not well defined, and therefore, pulmonary artery systolic pressure cannot be calculated. Based on the acceleration time in the RV outflow tract, the PA pressure is likely to be elevated.   9. There are no prior studies on this patient for comparison purposes.    Renal ultrasound, 05/11/19:  IMPRESSION:  ?  Mild right hydronephrosis, unchanged since prior.  Left pelviectasis.  ?  Abdominal ultrasound, 05/11/19:  IMPRESSION:  ?  1. Marked splenomegaly. ?Mild hepatomegaly.  ?  2. Borderline right hydronephrosis.  ?  CXR, 05/10/19:  IMPRESSION:  ?  Stable cardiomediastinal silhouette with mildly tortuous, calcified thoracic aorta. Persistent mild cardiomegaly.  Normal lung volumes. Increased density of the bilateral lower lung zones is accentuated due to overlying breast implants.  There are bilateral lower lobe patchy consolidations, most suggestive of pulmonary edema versus pneumonia.  ?No right pleural effusion. Trace left pleural effusion. No pneumothorax. Elevation of the left hemidiaphragm  No acute osseous abnormalities.  ?  Pertinent pathology (prior to admission):  Patlent Name: BAYLIN, RASCON Accession#: [id number] Med. Rec. # [id number] Taken: 10/30/2016 Encounter #: [id number] Received: 10/30/2016 Gender: F Reported: 11/04/2016 16:53 DOB: 01-Jan-1935 (Age: 81)  Submitting Physician: Hubert Azure D M.D.  Additional Physician(s):  [facility]  Clinical Information:  85 year old with remota history of mild pancytopenia in 2008. There ara no recent history or CBC studies.  Specimen Received:  A: Bone marrow,aspirate for clot section, pic,cyto sent to iu B: Bone marrow, biopsy (decalelfied),Ipic  ?  Final  Pathologic Diagnosis:  id B. Bone marrow, bio and aspirate for clot section, Jeft posterioriliac crest: Myeloid neoplasm. See note.  As the senior physician, ~ attest that I: (i) examined the relevant preparation(s) for the specimen(s); and (ii) rendered or confirmed the diagnosis(es).  ?Electronically Signed ngz1/10/30/2016 Jobie Quaker, M.D. (Pathologist)  Deanne Coffer, M.D. (Resident) Signing Location: [facility], [address], [place], [place] [address]  Note:  The majordifferential diagnosis includes chronic myelomonocytic leukemia. Clinical correlation and correlation with cytogenetics/FISH study including BCR-ABL7 fusion study is recommended for further classification.  Dr. 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  ?  Cellularity: Markedly  hypercellular (> 90%) Myslopoiesis: Markedly increased with full maturation Erythropoiesis: Decreased Megakaryopoiesis: Markedly increased with frequent hypolobated/monolobated megakaryocytes including small hyperchromatic forms Additional studles: Reticulin stain shows mild increasein reticulin fibrosis (1+) with appropriate control,  Theimmunohistechemical stains are performedin this tase and same of the antigens may also ba evaluated by flow cytometry, Concument immunchistechemieal stains on tissue sections are Indicated in order to correlate immunophenotype with cell morphology, evaluate spatial dletribution/architectural features and/or for a quantitation of disease involvement  Bone marrow,aspirate smear:  Sample quality: Adequate  ME ratio: Markedly increased, ~ 8:1 Blasts: Not increased (4%) ' Myelopoiesis: Markedly increased with occasional hypolobated neutrophils Erythropoiesis: Decreased with megaloblastoid changes Megakaryopoiesis: Markedly increased with frequent hypolobated/monolobated megakaryocytes Comments: No clusters of blasts, no Auer rods. Additional studies: Iron stain performed at the [facility] showsincreased storage iron with no ring sideroblasts. Control stains appropriately,  Bone marrow differential:  Percent  Blasts 2  Promyeltocytes 7  Myelocytes 23  Metamyelocytes 6  Bands 16  Polys 30  Lymphocytes 3  Monocytes 2  Eosinophils 4  Basophils 1  Nucleated RBCs 10  Peripheral blood, smear:  CBC: WBC 46 k/cumm, hemoglobIn 10,7 g/dL, MCV 81 fL, ROW 66.4%, platelet count 316 k/cumm Differential: Neutrophils 57%, lymphocytes 5%, monocytes 21%, myelocytas 3%, Metamyelocytes 8%, band 6%  RBCs: Normocytic anaemia with anlsopgikilocytosis. WEBCs: Leucocytosis, granulocytosis with left-shift, monocytosis. Platelets: Normal platelet caunt with occasional giantplatelets  ?  Results-Comments  CD34 and p53 stains show rare CD34+ blasts (<5%) and exceedingly rare p53+ cells. Controls stain appropriately.  ?  INTERERETATION: (600 cell diff)  ?  Bone Marrow Aspirate specimen is adequate and is markedly hypercellular. Spicules are present.  Megakaryocytes Are increaged in numberwith occasional small mono/hypolobated forms and rare small separated lobe forms identified. My elopoiesis Is abnormal, increased, and proceeds to completion. Blasts and promonocytes comprise 6% of total nucleated cells. 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 The M/E ratio is 8.871.  Erythroid series Shows occasional megaloblastoid changes.  Lymphoid Lymphocytes comprise 2% of total nucleated cells,  Comments: Correlate with bone marrow biopsy and cytogenetic studies,  Tron Stain Results: +2/6 Iron in reticulum, no ringed formsidentified.  Control: positive.  IMPRESSION: DATESIGNED OUT: 11/01/2016 TIME: 10:10 CMML-1.  COMMENT: The marrow biopsy was not available at the time of finalizing the aspirate report. In the event of a marrow?biopsy report with discrepant results an addendum will be provided by [facility] Hematopathology.  ?  My blast count was 8% ( 300 cell differential) with 15% monocytes; trilineage dysplasia; increased numbers of megakaryocytes and 90-100% cellular particles.  Becauseofthe high WBC I did my counting very close to the particles to void any dilution effect from the peripheral blood.  1% blasts in PBS and 19% monocytes.  JMB    Relevant labs:   BMP:   Lab Results   Component Value Date/Time    NA 138 05/18/2019 05:21 AM    K 4.3 05/18/2019 05:21 AM    CL 106 05/18/2019 05:21 AM    CO2 21 05/18/2019 05:21 AM    BUN 45 (H) 05/18/2019 05:21 AM    CREAT 1.44 (H) 05/18/2019 05:21 AM    CALCIUM 8.9 05/18/2019 05:21 AM    GLUCOSE 86 05/18/2019 05:21 AM       CBC:   Lab Results   Component Value Date/Time    WBC 15.64 (H) 05/18/2019 05:21 AM    HGB 7.3 (L) 05/18/2019 05:21 AM    HCT 23.2 (  L) 05/18/2019 05:21 AM    PLT 105 (L) 05/18/2019 05:21 AM     MICRO:  Covid-19 PCR negative, 05/10/19     Other:   Last BNP:   Lab Results   Component Value Date/Time    BNP 532 (H) 05/09/2019 08:23 PM     Last Coags:   Lab Results   Component Value Date/Time    PT 15.4 (H) 05/18/2019 05:21 AM    APTT 63.6 (H) 05/18/2019 05:21 AM    INR 1.3 05/18/2019 05:21 AM     Last Hgb A1C: No results found for: HGBA1C  Last LFT:   Lab Results   Component Value Date/Time    TOTPRO 7.6 05/14/2019 07:04 PM    ALBUMIN 4.0 05/14/2019 07:04 PM    BILITOT 0.4 05/14/2019 07:04 PM    ALKPHOS 202 (H) 05/14/2019 07:04 PM    AST 37 05/14/2019 07:04 PM    ALT 16 05/14/2019 07:04 PM     Last TSH:   Lab Results   Component Value Date/Time    TSH 5.5 (H) 05/10/2019 07:28 PM     Last VBG:   Lab Results   Component Value Date/Time    PHVEN 7.38 05/10/2019 07:28 AM    PCO2VEN 40 05/10/2019 07:28 AM    PO2VEN 49 05/10/2019 07:28 AM    BICARBVEN 23.0 05/10/2019 07:28 AM    BEVEN -1 05/10/2019 07:28 AM       Physical Exam: BP 163/68  ~ Pulse 94  ~ Temp 36.5 ?C (97.7 ?F) (Oral)  ~ Resp 18  ~ Ht 1.549 m (5' 1'')  ~ Wt 47.9 kg (105 lb 8 oz) Comment: standing weight ~ SpO2 97%  ~ BMI 19.93 kg/m?     General:  ?Well-appearing in NAD   Eyes:? ?PERRL, EOM grossly intact, sclerae and conjunctivae anicteric and non-injected bilaterally with no pallor or exudates.   Ears:  ?Hearing grossly intact   Nose/Mouth: ?MMM without exudates, ulcers, or petechiae.   Neck: ?Normal range of motion. No JVD   Lymph:? ?No anterior/posterior cervical or submandibular LAD.   Lungs:? ?CTAB, no wheeze, rhonchi, or crackles   Chest/Heart:? ?RRR, normal S1 S2, crescendo-decrescendo 2/6 systolic murmur heard best at 2nd R intercostal space, otherwise no MGR.    Abdomen:? ?Non-distended. No tenderness or masses on palpation, no rebound or guarding. +Splenomegaly   GU:? ?Not indicated.   Skin/integument: ?No rashes, lesions, or breakdown. Fingernails with normal contour, color, and lunulae.   Extremities:? ?No cyanosis, clubbing, or edema. Resolving hematoma noted over L trapezius and upper L chest that is not TTP. Preserved ROM of L shoulder.     Neuro:? ?Alert and interactive, cranial nerves II-XII grossly intact,  MS 5/5 in all extremities. Walks smoothly with FWW       Outpatient Provider To-Do List   As above.     Pending labs   Unresulted Labs (From admission, onward)    None           Discharge medications High-risk medications      Medication List      START taking these medications    aspirin 81 mg chewable tablet  Chew 1 tablet (81 mg total) by mouth daily.  Replaces: aspirin 81 mg EC tablet     diclofenac Sodium 1% gel  Commonly known as: Voltaren  Apply 2 g topically every six (6) hours as needed (for L shoulder pain).     guaiFENesin 100 mg/5 mL syrup  Commonly known  as: Robitussin  Take 5 mLs (100 mg total) by mouth every six (6) hours as needed.     * hydroxyurea 500 mg capsule  Commonly known as: Hydrea  Take 1 capsule (500 mg total) by mouth every evening.     * hydroxyurea 500 mg capsule  Commonly known as: Hydrea  Take 2 capsules (1,000 mg total) by mouth every morning.  Start taking on: May 19, 2019     loratadine 10 mg tablet  Commonly known as: Claritin  Take ONE-HALF tablets (5 mg total) by mouth daily.  Start taking on: May 19, 2019     losartan 50 mg tablet  Commonly known as: Cozaar  Take 1 tablet (50 mg total) by mouth daily.  Start taking on: May 19, 2019         * This list has 2 medication(s) that are the same as other medications prescribed for you. Read the directions carefully, and ask your doctor or other care provider to review them with you.            CHANGE how you take these medications    allopurinol 100 mg tablet  Commonly known as: Zyloprim  Take 1/2 tablets (50 mg total) by mouth daily.  Start taking on: May 19, 2019  What changed:   ? how much to take  ? when to take this     dilTIAZem 120 mg 24 hr capsule  Commonly known as: Cardizem CD  Take 1 capsule (120 mg total) by mouth daily.  Start taking on: May 19, 2019  What changed:   ? medication strength  ? how much to take     epoetin alfa-epbx 10,000 units/mL injection  Commonly known as: Retacrit  Inject 0.73 mLs (7,300 Units total) under the skin three (3) times a week.  Start taking on: May 21, 2019  What changed:   ? how much to take  ? when to take this  ? additional instructions  ? These instructions start on May 21, 2019. If you are unsure what to do until then, ask your doctor or other care provider.        CONTINUE taking these medications    esomeprazole 40 mg capsule  Commonly known as: NexIUM     Ferric Citrate 1 GM 210 MG(Fe) Tabs  Take 1 tablet by mouth three (3) times daily with meals.     gabapentin 100 mg capsule  Commonly known as: Neurontin  Take 2 capsules (200 mg total) by mouth at bedtime.     magnesium oxide 400 mg tablet     polyethylene glycol powder packet  Commonly known as: Glycolax  Take 1 packet (17 g total) by mouth daily.     rosuvastatin 20 mg tablet  Commonly known as: Crestor     sodium bicarbonate 650 mg tablet  Take 1 tablet (650 mg total) by mouth two (2) times daily.     * SYNTHROID 50 mcg tablet  Generic drug: levothyroxine     * SYNTHROID 75 mcg tablet  Generic drug: levothyroxine         * This list has 2 medication(s) that are the same as other medications prescribed for you. Read the directions carefully, and ask your doctor or other care provider to review them with you.            STOP taking these medications    aspirin 81 mg EC tablet  Replaced by: aspirin 81 mg chewable tablet  bisacodyl 5 mg EC tablet  Commonly known as: Dulcolax     cloNIDine 0.1 mg tablet  Commonly known as: Catapres     heparin 5000 unit/mL injection     hydrALAZINE 10 mg tablet     lactated ringers IV soln     pantoprazole 40 mg DR tablet  Commonly known as: Protonix           Where to Get Your Medications      These medications were sent to [facility] #[id number] - [place], [place] - [address]  [address] [place] [address]    Phone: [phone] ?   aspirin 81 mg chewable tablet     These medications were sent to [facility] ([facility]) [phone])  [address], [place] [address]    Hours: Mon-Fri 8AM-6PM, Saturdays & Holidays 8AM-5PM (Closed 1PM-2PM for lunch); Closed Sundays Phone: [phone] ?   allopurinol 100 mg tablet  ? diclofenac Sodium 1% gel  ? dilTIAZem 120 mg 24 hr capsule  ? epoetin alfa-epbx 10,000 units/mL injection  ? gabapentin 100 mg capsule  ? guaiFENesin 100 mg/5 mL syrup  ? hydroxyurea 500 mg capsule  ? hydroxyurea 500 mg capsule  ? loratadine 10 mg tablet  ? losartan 50 mg tablet      This patient does not have coumadin on their discharge med list    No controlled substances on discharge med list     Nutrition recommendations & malnutrition assessment   1. Continue low K, 1000mg  phos diet.  2. Will change Novasource Renal shake to vanilla and strawberry per pt request.  3. Will defer vitamins and minerals to primary team.    RD to follow.    Author:  Macy Mis, RD, pager [phone]  05/17/2019 1:50 PM    Level of Malnutrition: Non-Severe (Moderate) Malnutrition           Discharge diet orders   There are no active discharge diet orders for this encounter.      Rehab assessment   Inpatient Recommendation: PT treatment (05/12/19 1423)  Recommendation  Discharge Recommendation: Physical Therapy;1-3 x/week (05/17/19 1730)  Type of therapy: Home Health (05/17/19 1730)  Supervision Recommended on Discharge: <8 hr/day;Initially, wean supervision as tolerated (05/17/19 1730)  Discharge concerns: None noted(to stay with daughter initially) (05/17/19 1730)  Discharge Equipment Recommended: Junior;Walker (05/17/19 1730)  Equipment ordered: DME delivered (05/17/19 1730)       Discharge activity orders   There are no active discharge activity orders for this encounter.      Requested appointments Scheduled appointments    We will schedule and contact you with follow up appointment(s)   As   directed      Is the patient being discharged to SNF? No  Clinic/Test/Procedure to be scheduled: PCP  Doctor Preferences (If applicable): Dr. Gustavo Lah  Time Frame: 1-2 weeks  Reason for Appointment/Procedure: Post-discharge follow-up    Is the patient being discharged to SNF? No  Clinic/Test/Procedure to be scheduled: Nephrology  Doctor Preferences (If applicable): Dr. Fredric Dine  Time Frame: 1-2 weeks  Reason for Appointment/Procedure: AKI on CKD    Is the patient being discharged to SNF? No  Clinic/Test/Procedure to be scheduled: Oncology  Doctor Preferences (If applicable): Dr. Lennon Alstrom  Time Frame: 1-2 weeks  Reason for Appointment/Procedure: CMML       Future Appointments   Date Time Provider Department Center   06/01/2019 10:00 AM  Gordy Levan, MD [id number] MEDICINE   06/07/2019 10:45 AM Master, Humberto Leep., DO [facility] MEDICINE        Case Manager/Home Health assessment   Discharge Information  Discharge Address: [address], [place], [place] [address] [phone]/08/21 1525)  Contact Name: Dorenda Schwarzkopf (05/14/19 1336)  Relationship: Daughter (05/14/19 1336)  Contact Number(s): [phone] (05/14/19 1336)  Is patient/family informed of discharge?: Yes (05/14/19 1340)  Is patient/family agreeable of discharge destination?: Yes (05/14/19 1340)  Support Systems: Family (05/14/19 1340)  Medicare Important Message Provided: Yes (05/14/19 1340)  Final Discharge Needs: Home Health Coordination;Home Durable Medical and/or Respiratory Therapy Equipment (05/14/19 1340)    Home Health Coordination  Agency Name: [facility], [place], [place] (05/14/19 1336)  Contact Person: AIDIN (05/14/19 1336)  Phone Number(s): [phone] (05/14/19 1336)  Fax Number: [phone] (05/14/19 1336)  Start of Care Date: 05/18/19 (05/14/19 1344)  Services: Nursing;Physical Therapy;Occupational Therapy (05/14/19 1336)         Home Durable Medical and/or Respiratory Therapy Equipment  Vendor: [facility] [employer] [phone]) (05/14/19 1336)  Equipment Provided: Junior Walker (05/14/19 1336)  Delivery Arrangements: To Bedside from Vendor [employer] (05/14/19 1336)          Home Health orders    [facility] Care Per Rowena Protocol (Unless Otherwise Specified in Orders)   As   directed      Occupational Therapy   As directed      Physical Therapy   As directed      Physician Attestation for Home Health   As directed      Patient Name: Bexley Boothe    Patient MRN: [id number]      Attending Provider: Andrey Cota., MD        ATTESTATION OF FACE-TO-FACE PHYSICIAN-PATIENT ENCOUNTER    (Face to Face encounter must occur 90 days prior to, or 30 days after the   Start of Care date)    Physician ordering/certifying Home Health services: Ivor Costa. Wendie Chess,   MD    Date of Face-to-Face Encounter: 05/13/2019      I certify that this patient is under my care and that a face-to-face   encounter visit was conducted with the patient as noted above.      Attending Physician [NPI]: Andrey Cota., MD [[id number]]          Ordering Provider:Kysean Sweet M. Wendie Chess, MD         Date: 05/13/2019        Time: 12:05 PM          (For non-physician providers, please send document for co-signature by the   attending provider)    Skilled Nursing   As directed             Goals of Care Note (if new for this encounter)         LACE+ Risk Stratification    Total Score Risk Stratification   0-28 Minimal Risk   29-58 Moderate Risk   59-78 High Risk   79-90 Highest Risk     Readmission Score            76       Total Score        15 Urgent Admission    7 Length of Stay    3 ED Visits in Previous 6 Months    51 Charlson Score (by age & number of urgent admissions)        Criteria  that do not apply:    Female Patient    Discharge Institution    Alternative Level of Care Status    Elective Admission in Previous Year        DWA Dr. Domingo Sep.    Ivor Costa. Wendie Chess, MD  05/18/2019 12:44 PM

## 2019-05-12 NOTE — Consults
NUTRITION ASSESSMENT (Adult)    Admit Date: 05/10/2019     Date of Birth: 04/25/1934 Gender: female MRN: [id number]     Date of Assessment: 05/12/2019   Status: Assessment   Indication: CKD Stage 4   Subjective: 4/7  At visit, pt states she is losing wt. Pt ate 3 meals a day, no ONS/snacks PTA. RD provided tips on wt stabilization/regain and information on low K, low phos diet. Pt amanable to [facility] Novasource renal   Problems: Active Problems:    Hyperleukocytosis POA: Yes       Past Medical History:   Diagnosis Date   ? CKD (chronic kidney disease)    ? GERD (gastroesophageal reflux disease)    ? History of CVA (cerebrovascular accident) 2011   ? 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 Hyperlipidemia    ? Hypertension    ? Hypothyroidism    ? Leukemia (HCC/RAF)    ? Myelodysplasia (myelodysplastic syndrome) (HCC/RAF)     Past Surgical History:   Procedure Laterality Date   ? CESAREAN SECTION     ? MASTECTOMY Bilateral    ? TOTAL ABDOMINAL HYSTERECTOMY             Data   Intake/Outputs: I/O last 2 completed shifts:  In: 2127.5 [P.O.:580; I.V.:1447.5; IV Piggyback:100]  Out: 2100 [Urine:2100]    Pertinent Medications: noted    FDI Target Drugs: No      Pertinent Labs:   Recent Labs     05/12/19  1233 05/12/19  1231 05/11/19  0714 05/11/19  0714 05/10/19  0725 05/10/19  0725 05/10/19  0343 05/10/19  0343   NA 135  --    < > 137   < > 138  --   --    K 3.8  --    < > 4.0   < > 4.0  --   --    BUN 53*  --    < > 51*   < > 45*  --   --    CREAT 2.35*  --    < > 2.42*   < > 2.09*  --   --    GLUCOSE 106*  --    < > 73   < > 78  --   --    MG  --   --   --  1.3*  --  1.4   < >  --    CALCIUM 9.1  --    < > 9.5   < > 9.3  --   --    ICALCOR  --  1.19   < > 1.19   < >  --   --   --    PHOS 4.2  --    < > 4.5*   < > 4.3  --   --    ALBUMIN  --   --   --   --   --   --   --  3.5*   HGB  --  8.8*   < > 9.1*   < >  --   --  8.1*   HCT  --  28.0*   < > 29.7*   < >  --   --  26.7*   FE  --   -- --   --   --  45  --   --    ALKPHOS  --   --   --   --   --   --   --  145*    < > = values in this interval not displayed.         Accu-Chek: No results found in last 72 hours    Respiratory Status / O2 Device: None (Room air)    Pressure Injury:     Patient Lines/Drains/Airways Status    Active Pressure Ulcers     None                  Additional data:  abd nondistended, BM 4/6  Per MD note dated 4/  , ID: [id number] with history of CKD 4, HTN, and CMML presenting with lightheadedness and SOB, found to have hyperleukocytosis (WBC >100k), for which she is admitted   Diet Info   ? Allergies:   Oxycodone-acetaminophen, Penicillins, Sulfa antibiotics, and Naproxen  ? Cultural/Ethnic/Religious/Other Food Preferences:  None     ? Nutrition prior to admit:  3 meals, regular diet, variable po, no ONS  ? Current diet order:     Diets/Supplements/Feeds   Diet    Diet 1000mg  Phosphorus (Adult) Low K     Start Date/Time: 05/10/19 1950      Number of Occurrences: Until Specified     Order Questions:     ? Potassium: Low K     ? PO % consumed: (5-100%)  ? Parenteral Nutrition: n/a  ? Enteral Nutrition: n/a  ? Other caloric sources: n/a    Anthropometrics:  Height: 154.9 cm (5' 1'')  Admit Weight: 46.9 kg (103 lb 8 oz) (05/10/19 1756) Last 5 recorded weights:  Weights 05/09/2019 05/10/2019 05/10/2019 05/11/2019 05/12/2019   Weight 50.8 kg 49.4 kg 46.9 kg 47.5 kg 47.6 kg            IBW: 47.6 kg (105 lb)  % Ideal Body Weight: 100 %  BMI (Calculated): 19.9    Usual Weight: 50.3 kg (110 lb 14.3 oz)(04/2019)  % Usual Weight: 95 %      Estimated Nutrition Needs    Based on adm wt: 47kg   1410-1645kcal (30-35kcal/kg/d), 56-66g protein (1.2-1.4g/kg/d)      Diet Education   Teaching provided (Refer to Patient Education records)(low Phos, low K; tips to stabilize/regain wt)          Malnutrition Assessment   Malnutrition in the Context of: Chronic Illness/Mild-Moderate Inflammation   Energy Intake: Unable to assess  Weight Loss: > 5% in 1 month Nutrition-Focused Physical Exam: 05/12/2019  Not performed: defered at this time due to COVID-19 precautions         Patient meets criteria for: Unable to complete Malnutrition assessment at this time    Nutrition Assessment   BMI 19.9 = normal wt, lower end. On adm , pt at 94% of her wt one month ago  Variable po intake PTA and in hospital. Pt amenable to ONS  Elevated Cr/BUN; CKD st 4; K and phos WNL     Recommendations / Care Plan      Recommend 2 g Na, low K, 1000mg  phos diet, fluid restriction per MD  Recommend nephrovite daily  Will send Novasource renal daily  RD will follow    Author:  Thomasenia Sales, RD, CNSC, pager [phone]  05/12/2019 3:05 PM

## 2019-05-13 ENCOUNTER — Ambulatory Visit: Payer: PRIVATE HEALTH INSURANCE

## 2019-05-13 DIAGNOSIS — M25512 Pain in left shoulder: Secondary | ICD-10-CM

## 2019-05-13 DIAGNOSIS — I10 Essential (primary) hypertension: Secondary | ICD-10-CM

## 2019-05-13 DIAGNOSIS — M791 Myalgia, unspecified site: Secondary | ICD-10-CM

## 2019-05-13 LAB — Calcium,Ionized
IONIZED CA++,CORRECTED: 1.19 mmol/L (ref 1.09–1.29)
IONIZED CA++,CORRECTED: 1.17 mmol/L (ref 1.09–1.29)
IONIZED CA++,UNCORRECTED: 1.14 mmol/L (ref 1.09–1.29)

## 2019-05-13 LAB — Basic Metabolic Panel
ANION GAP: 11 mmol/L (ref 8–19)
CREATININE: 2.28 mg/dL — ABNORMAL HIGH (ref 0.60–1.30)
GLUCOSE: 100 mg/dL — ABNORMAL HIGH (ref 65–99)
POTASSIUM: 3.3 mmol/L — ABNORMAL LOW (ref 3.6–5.3)
SODIUM: 141 mmol/L (ref 135–146)
SODIUM: 141 mmol/L (ref 135–146)

## 2019-05-13 LAB — Uric Acid
URIC ACID: 8 mg/dL — ABNORMAL HIGH (ref 2.9–7.0)
URIC ACID: 7.9 mg/dL — ABNORMAL HIGH (ref 2.9–7.0)
URIC ACID: 8 mg/dL — ABNORMAL HIGH (ref 2.9–7.0)

## 2019-05-13 LAB — D-Dimer
D-DIMER STAGO: 2.47 ug{FEU}/mL — ABNORMAL HIGH (ref <0.60)
D-DIMER STAGO: 2.37 ug{FEU}/mL — ABNORMAL HIGH (ref <0.60)
D-DIMER STAGO: 2.37 ug{FEU}/mL — ABNORMAL HIGH (ref <0.60)

## 2019-05-13 LAB — Prothrombin Time Panel
INR: 1.4 s (ref 11.5–14.4)
INR: 1.5 s (ref 11.5–14.4)
PROTHROMBIN TIME: 18 s — ABNORMAL HIGH (ref 11.5–14.4)

## 2019-05-13 LAB — Differential, Manual: ABSOLUTE BASO CT,MANUAL: 3.5 10*3/uL — ABNORMAL HIGH (ref 0.0–0.1)

## 2019-05-13 LAB — CBC
MEAN CORPUSCULAR VOLUME: 92.4 fL (ref 79.3–98.6)
MEAN PLATELET VOLUME: 2.47 10*3/uL (ref 0.00–0.00)
NUCLEATED RBC%, AUTOMATED: 2.1 (ref 36.9–48.3)
RED BLOOD CELL COUNT: 3.01 x10E6/uL — ABNORMAL LOW (ref 3.96–5.09)
RED CELL DISTRIBUTION WIDTH-SD: 74.7 fL — ABNORMAL HIGH (ref 36.9–48.3)
RED CELL DISTRIBUTION WIDTH-SD: 74.7 fL — ABNORMAL HIGH (ref 36.9–48.3)

## 2019-05-13 LAB — Phosphorus
PHOSPHORUS: 3 mg/dL (ref 2.3–4.4)
PHOSPHORUS: 4.3 mg/dL (ref 2.3–4.4)
PHOSPHORUS: 3.9 mg/dL (ref 2.3–4.4)

## 2019-05-13 LAB — APTT
APTT: 49.1 s — ABNORMAL HIGH (ref 24.4–36.2)
APTT: 54.6 s — ABNORMAL HIGH (ref 24.4–36.2)
APTT: 64.3 s — ABNORMAL HIGH (ref 24.4–36.2)

## 2019-05-13 LAB — Glucose,POC: GLUCOSE,POC: 96 mg/dL (ref 65–99)

## 2019-05-13 LAB — CALR (Calreticulin) Mutation Analysis

## 2019-05-13 LAB — Fibrinogen
FIBRINOGEN: 223 mg/dL — ABNORMAL LOW (ref 235–490)
FIBRINOGEN: 216 mg/dL — ABNORMAL LOW (ref 235–490)
FIBRINOGEN: 229 mg/dL — ABNORMAL LOW (ref 235–490)

## 2019-05-13 MED ORDER — LISINOPRIL 10 MG PO TABS
10 mg | ORAL_TABLET | Freq: Every day | ORAL | 0 refills | Status: AC
Start: 2019-05-13 — End: 2019-05-18

## 2019-05-13 MED ORDER — GABAPENTIN 100 MG PO CAPS
100 mg | ORAL_CAPSULE | Freq: Every evening | ORAL | 0 refills | Status: AC
Start: 2019-05-13 — End: 2019-05-18

## 2019-05-13 MED ORDER — DICLOFENAC SODIUM 1 % EX GEL
2 g | Freq: Four times a day (QID) | TOPICAL | 0 refills | Status: AC | PRN
Start: 2019-05-13 — End: 2019-05-18

## 2019-05-13 MED ORDER — HYDROXYUREA 500 MG PO CAPS
500 mg | ORAL_CAPSULE | Freq: Two times a day (BID) | ORAL | 0 refills | Status: AC
Start: 2019-05-13 — End: 2019-05-15

## 2019-05-13 MED ORDER — ALLOPURINOL 100 MG PO TABS
50 mg | ORAL_TABLET | Freq: Every day | ORAL | 0 refills | Status: AC
Start: 2019-05-13 — End: 2019-05-18

## 2019-05-13 MED ORDER — ASPIRIN 81 MG PO CHEW
81 mg | ORAL_TABLET | Freq: Every day | ORAL | 0 refills | Status: AC
Start: 2019-05-13 — End: ?

## 2019-05-13 MED ADMIN — HYDRALAZINE HCL 25 MG PO TABS: 25 mg | ORAL | @ 03:00:00 | Stop: 2019-06-10 | NDC 00904644161

## 2019-05-13 MED ADMIN — MAGNESIUM OXIDE 400 (240-241.3 MG) MG (MULTI-GPI) PO TABS: 400 mg | ORAL | @ 15:00:00 | Stop: 2019-05-19 | NDC 64980033901

## 2019-05-13 MED ADMIN — LIDOCAINE 5 % EX PTCH: 1 | TRANSDERMAL | @ 05:00:00 | Stop: 2019-06-09

## 2019-05-13 MED ADMIN — DICLOFENAC SODIUM 1 % EX GEL: TOPICAL | @ 23:00:00 | Stop: 2019-05-19 | NDC 46122064654

## 2019-05-13 MED ADMIN — ASPIRIN 81 MG PO CHEW: 81 mg | ORAL | @ 15:00:00 | Stop: 2019-06-09 | NDC 66553000201

## 2019-05-13 MED ADMIN — LIDOCAINE 5 % EX PTCH: 1 | TRANSDERMAL | @ 19:00:00 | Stop: 2019-05-19 | NDC 00591352530

## 2019-05-13 MED ADMIN — HEPARIN SODIUM (PORCINE) 5000 UNIT/ML IJ SOLN: 5000 [IU] | SUBCUTANEOUS | @ 15:00:00 | Stop: 2019-05-22 | NDC 00641040012

## 2019-05-13 MED ADMIN — DICLOFENAC SODIUM 1 % EX GEL: TOPICAL | @ 04:00:00 | Stop: 2019-05-19 | NDC 46122064654

## 2019-05-13 MED ADMIN — ROSUVASTATIN CALCIUM 20 MG PO TABS: 20 mg | ORAL | @ 03:00:00 | Stop: 2019-05-19 | NDC 60687024501

## 2019-05-13 MED ADMIN — ACETAMINOPHEN 325 MG PO TABS: 650 mg | ORAL | @ 15:00:00 | Stop: 2019-05-13 | NDC 50580045811

## 2019-05-13 MED ADMIN — LACTATED RINGERS IV SOLN: 75 mL/h | INTRAVENOUS | @ 08:00:00 | Stop: 2019-05-17 | NDC 00338011704

## 2019-05-13 MED ADMIN — GABAPENTIN 100 MG PO CAPS: 100 mg | ORAL | @ 03:00:00 | Stop: 2019-06-10 | NDC 60687058001

## 2019-05-13 MED ADMIN — LACTATED RINGERS IV SOLN: 75 mL/h | INTRAVENOUS | @ 23:00:00 | Stop: 2019-05-17 | NDC 00338011704

## 2019-05-13 MED ADMIN — HEPARIN SODIUM (PORCINE) 5000 UNIT/ML IJ SOLN: 5000 [IU] | SUBCUTANEOUS | @ 03:00:00 | Stop: 2019-05-22 | NDC 00641040012

## 2019-05-13 MED ADMIN — MELATONIN 3 MG PO TABS: 3 mg | ORAL | @ 04:00:00 | Stop: 2019-05-19

## 2019-05-13 MED ADMIN — ACETAMINOPHEN 325 MG PO TABS: 650 mg | ORAL | @ 21:00:00 | Stop: 2019-05-15 | NDC 50580045811

## 2019-05-13 MED ADMIN — LIDOCAINE HCL (PF) 1 % IJ SOLN: 2 mL | SUBCUTANEOUS | @ 19:00:00 | Stop: 2019-05-19 | NDC 63323049227

## 2019-05-13 MED ADMIN — LISINOPRIL 5 MG PO TABS: 10 mg | ORAL | @ 15:00:00 | Stop: 2019-05-15 | NDC 68084019601

## 2019-05-13 MED ADMIN — PANTOPRAZOLE SODIUM 40 MG PO TBEC: 40 mg | ORAL | @ 15:00:00 | Stop: 2019-05-19 | NDC 50268063915

## 2019-05-13 MED ADMIN — DICLOFENAC SODIUM 1 % EX GEL: TOPICAL | @ 15:00:00 | Stop: 2019-05-19 | NDC 46122064654

## 2019-05-13 MED ADMIN — ACETAMINOPHEN 325 MG PO TABS: 650 mg | ORAL | @ 02:00:00 | Stop: 2019-05-13 | NDC 50580045811

## 2019-05-13 MED ADMIN — HYDROXYUREA 500 MG PO CAPS: 500 mg | ORAL | @ 01:00:00 | Stop: 2019-05-14 | NDC 00904693961

## 2019-05-13 MED ADMIN — ROSUVASTATIN CALCIUM 10 MG PO TABS: 20 mg | ORAL | @ 03:00:00 | Stop: 2019-06-10 | NDC 60687024501

## 2019-05-13 MED ADMIN — LEVOTHYROXINE SODIUM 75 MCG PO TABS: 75 ug | ORAL | @ 15:00:00 | Stop: 2019-06-10 | NDC 51079044120

## 2019-05-13 MED ADMIN — ALLOPURINOL 50 MG PO TABS: 50 mg | ORAL | @ 16:00:00 | Stop: 2019-06-12 | NDC 00603211521

## 2019-05-13 MED ADMIN — HYDROXYUREA 500 MG PO CAPS: 500 mg | ORAL | @ 12:00:00 | Stop: 2019-05-14 | NDC 00904693961

## 2019-05-13 MED ADMIN — FERROUS SULFATE 325 (65 FE) MG PO TBEC: 325 mg | ORAL | @ 15:00:00 | Stop: 2019-05-19 | NDC 00245010801

## 2019-05-13 NOTE — Progress Notes
RENAL CONSULTATION INPATIENT  Referring Physician: Early Osmond  CC: CKD    Interval   05/12/2019   No new complaints, but feeling about the same without improvement compared to yesterday. BP elevated. Started hydroxyurea    History of Present Illness  85 y.o. F with proteinuric CKD admitted for dizziness and shortness of breath, found to have hyperleukocytosis.     Over the past few years the creatinine has gradually trended up into the 2s. A few years ago a nephrologist in [place] (her regular nephrologist) advised her to stop lisinopril in the setting of getting a colonoscopy, ''because the kidney disease is very advanced''. She had undergone a renal artery angiogram which showed nonobstructive atherosclerosis. There is mention of rapidly progressive kidney disease but the records I was able to see in care everywhere show a more slow rise in creatinine over the past few years.     She has been visiting her daughter [name] (who is at the bedside) for the past several months and during this time has been following the CKD clinic in [place]. They recently started her on diltiazem for the proteinuria which correlated with onset of the dizziness, though the dilt was reduced, and then stopped prior to this admission, but she does not feel any better.     She does report a remote hx of kidney stones assoc with renal colic. However there have been no recent events of this.     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     She has a hx of HTN     Medications  Allergies   Allergen Reactions   ? Oxycodone-Acetaminophen Other (See Comments)   ? Penicillins Hives   ? Sulfa Antibiotics Agranulocytosis   ? Naproxen Rash     Aleve     ? aspirin  81 mg Oral Daily   ? ferrous sulfate  325 mg Oral Every Other Day   ? gabapentin  100 mg Oral QHS   ? heparin  5,000 Units Subcutaneous Q12H   ? hydroxyurea  500 mg Oral BID   ? levothyroxine  50 mcg Oral Every Other Day   ? levothyroxine  75 mcg Oral Every Other Day   ? lidocaine  1 patch Transdermal Q24H   ? magnesium oxide  400 mg Oral Daily   ? pantoprazole  40 mg Oral Daily   ? rosuvastatin  20 mg Oral QHS     acetaminophen, bisacodyl, diclofenac Sodium, hydrALAZINE, magnesium hydroxide, melatonin, senna    EXAM  BP (!) 180/70  ~ Pulse 91  ~ Temp 36.1 ?C (97 ?F) (Axillary)  ~ Resp 17  ~ Ht 1.549 m (5' 1'')  ~ Wt 47.6 kg (104 lb 14.4 oz)  ~ SpO2 95%  ~ BMI 19.82 kg/m?     Intake/Output Summary (Last 24 hours) at 05/12/2019 2155  Last data filed at 05/12/2019 1900  Gross per 24 hour   Intake 1799.75 ml   Output 1450 ml   Net 349.75 ml     General: NAD, well-developed  Eyes: Conjunctiva and lids normal; Pupils equal, Anicteric  ENMT: normal facial anatomy with no traumatic lesions; normal mucosa, no discharge  Neck: supple, trachea midline, no thyromegaly  Respiratory: Normal work of breathing, CTAB  Cardiovascular: regular, no added heart sounds; JVP normal; no edema  Abdomen: soft, NT/ND, no HSM  Vascular: symmetric 2+ pedal pulses; no ulcerations  Skin: warm and dry; normal coloration  Neuro: CN  intact; moves all ext  Psych: alert, oriented; normal affect; appropriate judgement and insight    LABS  Lab Results   Component Value Date    CREAT 2.28 (H) 05/12/2019    BUN 51 (H) 05/12/2019    NA 136 05/12/2019    K 3.8 05/12/2019    CL 102 05/12/2019    CO2 23 05/12/2019       ASSESSMENT  85 y.o. delightful female with CKD of unknown cause, characterized by slow progression, subnephrotic proteinuria, and HTN. Admitted for dizziness likely due to hyperleukocytosis.    1. CKD stage 4 with proteinuria and intermittent microhematuria, mild bump in creat v fluctuations due to leukostasis. Unclear cause.   2. 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 Coagulopathy   4. Hyponatremia mild   5. R hydro mild ? Significance. Not likely causing AKI. Could be sequella from prior stone   6. HTN  7. Prior kidney stones (Seen R side ultrasound 2020, per report) - treated with allopurinol for a time now off        RECOMMENDATIONS  Cont to monitor renal fct for response to WBC lowering with hydroxyurea  Will start lisinopril (previously well tolerated) 10 mg/d tomorrow for HTN and protienuria  For now, ok to cont hydral prn     Elevated risk of recurrent AKI:  - Avoid nephrotoxic injury  - Dose all medications to current eGFR    Thank you for involving me in the care of this pleasant and interesting patient.    Clementeen Graham, MD

## 2019-05-13 NOTE — Progress Notes
DME update:    Jr walker    Dispensed to bedside from [facility] closet        Lindsay Brennan

## 2019-05-13 NOTE — Nursing Note
1140: Per MD Pedley, [place] for pt to receive potatoes with meals as requested

## 2019-05-13 NOTE — Progress Notes
DAILY GERIATRIC PROGRESS NOTE    DATE OF SERVICE: 05/13/2019  HOSPITAL DAY: 3  CHIEF COMPLAINT: No chief complaint on file.    ID: [id number] with history of CKD 4, HTN, and CMML presenting with lightheadedness and SOB, found to have hyperleukocytosis (WBC >100k), for which she is admitted.    INTERVAL EVENTS:  - TLS labs stable/improving  --> uric acid 12.8 --> 8.1  - DIC labs stable/improving  --> INR 1.5  - Creatinine stable but above baseline  - seen by heme/onc: continuing hydroxyurea 500mg  BID for hyperleukocytosis  - seen by renal: L pelviectasis seen on renal u/s likely 2/2 old (passed) kidney stone; start lisinopril 10mg  for HTN and proteinuria (UMA 377mg /g) and will do CT KUB if Cr >3.0, otherwise no recs  - TTE done but report pending  - WBC down-trending (150k --> 115k) on hydroxyurea    SUBJECTIVE:  - NAEO, sleep interrupted due to L shoulder pain, tolerating PrimaFit  - 1L NC overnight but otherwise on RA  - Otherwise no new cardiac, respiratory, or GI symptoms.    MEDICATIONS:  Scheduled:  acetaminophen, 650 mg, Oral, Q6H  allopurinol, 50 mg, Oral, Daily  aspirin, 81 mg, Oral, Daily  ferrous sulfate, 325 mg, Oral, Every Other Day  gabapentin, 100 mg, Oral, QHS  heparin, 5,000 Units, Subcutaneous, Q12H  hydroxyurea, 500 mg, Oral, BID  levothyroxine, 50 mcg, Oral, Every Other Day  levothyroxine, 75 mcg, Oral, Every Other Day  lidocaine, 1 patch, Transdermal, Q24H  lisinopril, 10 mg, Oral, Daily  magnesium oxide, 400 mg, Oral, Daily  pantoprazole, 40 mg, Oral, Daily  rosuvastatin, 20 mg, Oral, QHS  PRN:  bisacodyl, diclofenac Sodium, hydrALAZINE, lidocaine PF, magnesium hydroxide, melatonin, senna  Infusions:  ? lactated ringers 75 mL/hr (05/13/19 0111)       PHYSICAL EXAM:  Temp:  [36.1 ?C (96.9 ?F)-36.6 ?C (97.9 ?F)] 36.3 ?C (97.3 ?F)  Heart Rate:  [87-94] 92  Resp:  [14-19] 18  BP: (148-180)/(58-73) 164/73  NBP Mean:  [98-101] 101  SpO2:  [85 %-98 %] 91 %  I/O: I/O last 2 completed shifts:  In: 2618.5 [P.O.:476; I.V.:2042.5; Other:100]  Out: 1950 [Urine:1950]   +600cc    General:   Well-appearing in NAD   Eyes:   PERRL, EOM grossly intact, sclerae and conjunctivae anicteric and non-injected bilaterally with no pallor or exudates.   Ears:   Hearing grossly intact   Nose/Mouth:  MMM without exudates, ulcers, or petechiae.   Neck:  Normal range of motion. No JVD   Lymph:   No anterior/posterior cervical or submandibular LAD.   Lungs:   No increased WOB, no dullness to percussion, good air movement bilaterally, mild crackles at L lung base, otherwise CTAB with no RWR.   Chest/Heart:   RRR, normal S1 S2, crescendo-decrescendo 2/6 systolic murmur heard best at 2nd R intercostal space, otherwise no MGR.    Abdomen:   Non-distended. No tenderness or masses on palpation, no rebound or guarding. +Splenomegaly   GU:   Not indicated.   Skin/integument:  No rashes, lesions, or breakdown. Fingernails with normal contour, color, and lunulae.   Extremities:   No cyanosis, clubbing, or edema. TTP along L trapezius. Preserved ROM of L shoulder. Negative empty can test.   Neuro:   Alert and interactive, cranial nerves II-XII grossly intact, ambulates with shuffling gait without assistance       CAM DELIRIUM TEST:  Feature One:   A. Acute Change in mental status from baseline? []   yes [x]  no   B. Is there fluctuation in the mental status? []  yes [x]  no    Feature Two:  A. Does that patient have difficulty focusing attention?  []  yes [x]  no   (days of week, serial 7s)    Feature Three:  A.Is there evidence of disorganized thinking?  []  yes [x]  no    Feature Four:  A. Does the patient have an altered level of consciousness (ie: vigilant, lethargic, stuporous, comatous?  []  yes [x]  no    The diagnosis of delirium by CAM requires the presence of features 1 and 2 and either 3 or 4      DATA:  I have reviewed the following information from the last 24 hours: allied health and treating physician notes, imaging, labs and microbiology data and cardiac studies and telemetry data:    Lab Results   Component Value Date    WBC 115.46 (H) 05/13/2019    HGB 8.2 (L) 05/13/2019    HCT 27.3 (L) 05/13/2019    PLT 174 05/13/2019     Lab Results   Component Value Date    NA 141 05/13/2019    K 3.6 05/13/2019    CL 103 05/13/2019    CO2 24 05/13/2019    BUN 48 (H) 05/13/2019    CREAT 2.14 (H) 05/13/2019    GLUCOSE 54 (L) 05/13/2019    CALCIUM 9.3 05/13/2019    MG 1.3 (L) 05/11/2019    PHOS 4.3 05/13/2019     Lab Results   Component Value Date    APTT 54.6 (H) 05/13/2019    PT 18.0 (H) 05/13/2019    INR 1.6 05/13/2019     Lab Results   Component Value Date    ALT 14 05/10/2019    AST 32 05/10/2019    BILITOT 0.6 05/10/2019    ALKPHOS 145 (H) 05/10/2019    ALBUMIN 3.5 (L) 05/10/2019     Lab Results   Component Value Date    TSH 5.5 (H) 05/10/2019     Lab Results   Component Value Date    CHOL 77 (L) 01/18/2019    CHOLHDL 19 (L) 01/18/2019    TRIGLY 165 (H) 01/18/2019       MICRO:  Covid-19 PCR negative, 05/10/19    IMAGING:     L Shoulder x-ray, 05/13/19:  IMPRESSION:  1. No bone lesions.  2. Mild glenohumeral osteoarthritis. Anterior acromial spur, mild acromiohumeral narrowing and spur formation on the greater tuberosity suggest chronic rotator cuff disease.    Renal ultrasound, 05/11/19:  IMPRESSION:  ?  Mild right hydronephrosis, unchanged since prior.  Left pelviectasis.    Abdominal ultrasound, 05/11/19:  IMPRESSION:  ?  1. Marked splenomegaly.  Mild hepatomegaly.  ?  2. Borderline right hydronephrosis.    CXR, 05/10/19:  IMPRESSION:  ?  Stable cardiomediastinal silhouette with mildly tortuous, calcified thoracic aorta. Persistent mild cardiomegaly.  Normal lung volumes. Increased density of the bilateral lower lung zones is accentuated due to overlying breast implants.  There are bilateral lower lobe patchy consolidations, most suggestive of pulmonary edema versus pneumonia.   No right pleural effusion. Trace left pleural effusion. No pneumothorax. Elevation of the left hemidiaphragm  No acute osseous abnormalities.    ?  Pertinent pathology:  [place] Name: Lindsay Brennan, Lindsay Brennan Accession#: [id number] Med. Rec. # [id number] Taken: 10/30/2016 Encounter #: [id number] Received: 10/30/2016 Gender: F Reported: 11/04/2016 16:53 DOB: 1934-11-16 (Age: 11)  Submitting Physician: Hubert Azure D M.D.  Additional Physician(s):  [facility]  Clinical Information:  85 year old with remota history of mild pancytopenia in 2008. There ara no recent history or CBC studies.  Specimen Received:  A: Bone marrow,aspirate for clot section, pic,cyto sent to iu B: Bone marrow, biopsy (decalelfied),Ipic  ?  Final  Pathologic Diagnosis:  id B. Bone marrow, bio and aspirate for clot section, Jeft posterioriliac crest: Myeloid neoplasm. See note.  As the senior physician, ~ attest that I: (i) examined the relevant preparation(s) for the specimen(s); and (ii) rendered or confirmed the diagnosis(es).  ?Electronically Signed ngz1/10/30/2016 Jobie Quaker, M.D. (Pathologist)  Deanne Coffer, M.D. (Resident) Signing Location: [facility], [address], [address] [place], [place] [address]  Note:  The majordifferential diagnosis includes chronic myelomonocytic leukemia. Clinical correlation and correlation with cytogenetics/FISH study including BCR-ABL7 fusion study is recommended for further classification.  Dr. 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  ?  Cellularity: Markedly hypercellular (> 90%) Myslopoiesis: Markedly increased with full maturation Erythropoiesis: Decreased Megakaryopoiesis: Markedly increased with frequent hypolobated/monolobated megakaryocytes including small hyperchromatic forms Additional studles: Reticulin stain shows mild increasein reticulin fibrosis (1+) with appropriate control,  Theimmunohistechemical stains are performedin this tase and same of the antigens may also ba evaluated by flow cytometry, Concument immunchistechemieal stains on tissue sections are Indicated in order to correlate immunophenotype with cell morphology, evaluate spatial dletribution/architectural features and/or for a quantitation of disease involvement  Bone marrow,aspirate smear:  Sample quality: Adequate  ME ratio: Markedly increased, ~ 8:1 Blasts: Not increased (4%) ' Myelopoiesis: Markedly increased with occasional hypolobated neutrophils Erythropoiesis: Decreased with megaloblastoid changes Megakaryopoiesis: Markedly increased with frequent hypolobated/monolobated megakaryocytes Comments: No clusters of blasts, no Auer rods. Additional studies: Iron stain performed at the [facility] showsincreased storage iron with no ring sideroblasts. Control stains appropriately,  Bone marrow differential:  Percent  Blasts 2  Promyeltocytes 7  Myelocytes 23  Metamyelocytes 6  Bands 16  Polys 30  Lymphocytes 3  Monocytes 2  Eosinophils 4  Basophils 1  Nucleated RBCs 10  Peripheral blood, smear:  CBC: WBC 46 k/cumm, hemoglobIn 10,7 g/dL, MCV 81 fL, ROW 29.5%, platelet count 316 k/cumm Differential: Neutrophils 57%, lymphocytes 5%, monocytes 21%, myelocytas 3%, Metamyelocytes 8%, band 6%  RBCs: Normocytic anaemia with anlsopgikilocytosis. WEBCs: Leucocytosis, granulocytosis with left-shift, monocytosis. Platelets: Normal platelet caunt with occasional giantplatelets  ?  Results-Comments  CD34 and p53 stains show rare CD34+ blasts (<5%) and exceedingly rare p53+ cells. Controls stain appropriately.  ?  INTERERETATION: (600 cell diff)  ?  Bone Marrow Aspirate specimen is adequate and is markedly hypercellular. Spicules are present.  Megakaryocytes Are increaged in numberwith occasional small mono/hypolobated forms and rare small separated lobe forms identified. My elopoiesis Is abnormal, increased, and proceeds to completion. Blasts and promonocytes comprise 6% of total nucleated cells. 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 The M/E ratio is 8.871.  Erythroid series Shows occasional megaloblastoid changes.  Lymphoid Lymphocytes comprise 2% of total nucleated cells,  Comments: Correlate with bone marrow biopsy and cytogenetic studies,  Tron Stain Results: +2/6 Iron in reticulum, no ringed formsidentified.  Control: positive.  IMPRESSION: DATESIGNED OUT: 11/01/2016 TIME: 10:10 CMML-1.  COMMENT: The marrow biopsy was not available at the time of finalizing the aspirate report. In the event of a marrow biopsy report with discrepant results an addendum will be provided by [facility].  ?  My blast count was 8% ( 300  cell differential) with 15% monocytes; trilineage dysplasia; increased numbers of megakaryocytes and 90-100% cellular particles.  Becauseofthe high WBC I did my counting very close to the particles to void any dilution effect from the peripheral blood.  1% blasts in PBS and 19% monocytes.  JMB    ASSESSMENT/PLAN:   Lindsay Brennan is a 85 y.o. 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  ?  #Symptomatic Hyperleukocytosis   #History of chronic myelomonocytic leukemia type-1  Patient's lightheadedness and SOB may be secondary to WBC >100k, though other than elevated Cr no e/o end-organ damage. Blasts <5% (4% on admission) making blast crisis less likely, though patient declining bone marrow bx to determine extent of blast cells in bone marrow. Patient has not received treatment previously, and notably CMML is not driven by BCR-ABL mutation. Evaluated by hematology/oncology who recommend hydroxyurea (dose-reduced for eGFR <60); if no improvement in WBC or worsening in symptoms will consider leukapheresis. Toxicities of hypo-methylating agents (treatment of choice in MDS/CMML) likely outweigh benefits in this 85yo patient, and regardless patient hopes to return to [place] for consideration of more aggressive treatment once she is stabilized. 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 Of note, no e/o of infection to explain marked increase in WBC from prior baseline (previously 45k in October 2020), though will have low threshold to start antibiotics, noting PCN allergy (hives). Elevated procalcitonin (1.12) difficult to interpret in setting of high circulating cytokines from heme malignancy.  []  heme/onc consulted, appreciate recs:  - hydroxyurea 500mg  (50% dose-reduced given eGFR <60) started 4/6  --> if no significant improvement in WBC by 4/8, will consider leukapheresis (or if symptoms worsen)  -Trend TLS labs (uric acid, phos, K, iCa) q8h  -Trend DIC labs (INR, PTT, fibrinogen, D-dimer, platelets) q8h  --> maintain platelets >30lk  --> maintain fibrinogen >100  -Aggressive hydration (1.5cc/kg per hour --> 75cc/h in this patient)  --> IV Lasix 20mg  prn if worsening oxygenation or high positive fluid balance on strict I/Os  -Follow-up hematology malignancy panel  -Follow-up peripheral blood flow cytometry    #Acute hypoxic respiratory failure:  #Stage IV CKD:  #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 Suspect hypervolemia due to renal insufficiency. BNP mildly elevated. No acid/base disorder noted on 4/5 VBG. UA without significant pyuria. L pelviectasis on renal ultrasound but per renal this is likely sequelae of prior nephrolithiasis. UMA 377mg /g.  []  renal consulted, appreciate recs:  - recommend restarting lisinopril 10mg  (4/8-) for long-term benefit in CKD  - trend Cr daily  - allopurinol 50mg   - supplemental O2; continous O2 monitoring    #Lightheadness, POA:  #Systolic Murmur c/f Aortic Stenosis:  Symptoms appears to be orthostatic in the setting of diltiazem, though orthostatics notably negative in ED. Patient reports improvement being off diltiazem. However, query component of deconditioning. Systolic murmur on exam c/f AS but doubt symptoms 2/2 valve disease.  []  hold home diltiazem 180mg  and clonidine 0.1mg  TID  []  fall precautions --> junior walker ordered per PT recs  []  PT/OT following  []  TTE to assess heart and valvular function --> read pending  ?  #Elevated INR, POA, improving:  INR 1.9 on admission. This raises c/f synthetic liver dysfunction due to leukostasis or DIC, which can occur in up to 40% of patients with hyperleukocytosis. Per heme/onc, high INR likely due to factor X deficiency from factor X binding to atypical monocytes. Reassuringly, other DIC labs WNL (other than D-dimer), and other liver labs WNL (including albumin at 3.5). Consider nutritional deficiency given recent weigh loss, or consumptive coagulopathy given splenomegaly. INR now improving.  []  CTM, will consider vit K administration if increasing INR, after discussion with heme/onc  ?  #Anemia, POA: Hgb 8.9. Likely 2/2 renal disease and CMML. Iron studies not c/w iron deficiency anemia (ferritin 640, 28% transferrin saturation). Received Epo treatment as outpatient.  - trend CBC daily  - no indication for epo on this admission  - continue home iron supplementation (given CKD and on epo)  ?  #Hypertension:   Hold home diltiazem 180mg  and clonidine 0.1mg  TID given possibly contributing to lightheadedness and dizziness. Will spot dose with hydralazine and if BP persistently >160 will consider standing amlodipine.  []  start lisinopril 10mg  as above  - hydral 25mg  PO q6h prn for SBP >180 and/or DBP >110    #L shoulder pain, chronic but worsening:  X-ray suggestive of chronic rotator cuff disease, however exam also with TTP along L trapezius suggestive of myofascial pain syndrome.   []  [place] consulted for consideration of trigger point injections  - diclofenac 1% gel prn  - lidocaine patch prn  - acetaminophen RTC    Chronic/Stable Medical Problems  #Subclinical hypothyrodism: TSH 5.2 on admission  -Continue levothyroxine , on alternating days   ?  #HLD:   -Continue rosuvastatin?20mg   ?  #Peripheral neuropathy:  -Continue gabapentin but dose reduce to 100mg  at bedtime (from 200mg ) given worsening kidney function and dizziness  ?  Inpatient Checklist  Diet: renal (low K, low phos)  GI ppx: home PPI  DVT ppx: heparin subq  Lines: pIV  Tubes/Drains: none  ?  CODE STATUS: Full Code  Primary Emergency Contact: Margot Ables [phone])  ?  Disposition:  TBD (pending PT/OT eval)  ?  Ambulatory Status & Fall Risk  [] ? non-ambulatory  [] ? walks with cane and/or walker  [x] ? walks independently  [] ? walks independently but may benefit from an assistive device evaluation with physical therapy  [] ? needs supervision with walking secondary to poor safety awareness  ?  Discharge Considerations:  [x] ? recommend PT evaluation to see if appropriate for skilled nursing facility   [x] ? recommend increase level of care/supervision then patient currently has at home  [] ? recommend 24 hour supervision  [] ? recommend home health services  [] ? after stabilization patient may return to prior living situation as it is adequate to meet his/her needs  [] ? recommend new assistive device for ambulation, safety, and fall risk  [x] ? will re-evaluate later in the hospital course once further stabilized    This patient has an advanced directive:  Yes []   No [x]  Unknown []     This patient's designated decision maker if or when patient lacks capacity is: Daughter Kim  Code Status (designated by patient): Full Code    Discussed with attending, Dr. Andrey Cota., MD    Signed,  Henderson Cloud, MD/MBA  Internal Medicine, PGY-3  [phone]      Attending Addendum:  I have personally interviewed and examined the patient with Dr. Wendie Chess on the date of service. All labs, studies, and medications were independently reviewed by me. I have reviewed the  note above and agree with the history, physical, assessment and plan which we formulated together. The patient/family/caregiver(s) were informed of the above plan, demonstrated understanding and was agreeable to the plan as written above. I have the following additions/corrections to the note:     Reviewed case with the patient's daughter, [name] who reports that the patient will remain locally for a few weeks while she recovers, and then they will travel back to [place] together. Therefore, we will place orders for Home Health PT/OT/RN to aid with the patient's recovery and health    Greater than 50% of today's 50 minute visit was spent counseling the patient and family regarding the above medical conditions, treatment options and management expectations and in coordination of care with staff.    Aiman Noe L. Lennice Sites, MD  Geriatrics Attending

## 2019-05-13 NOTE — Progress Notes
Physical Therapy Treatment      PATIENT: Lindsay Brennan  MRN: [id number]    Treatment Date: 05/13/2019    Patient Presentation: Up in chair;HLIV;Chair alarm on;Family/CG present(daughter present)      Precautions   Precautions: Fall risk;Monitor Vitals;Check Labs  Orthotic: None  Current Activity Order: Activity as tolerated  Weight Bearing Status: Not Applicable    Cognition   Cognition: Within Defined Limits  Safety Awareness: Good awareness of safety precautions  Barriers to Learning: None    Bed Mobility   Supine to Sit: Not Performed(up in chair at start/end of session)    Functional Mobility   Sit to Stand: Stand by Assist  Ambulation: Stand by Assist  Ambulation Distance (Feet): 350'  Gait Pattern: Decreased pace;Decreased heel-toe  Assistive Device: Junior;Front wheeled walker  Stairs: Not Applicable       Exercises   Other Exercise(s): STS w/ B hands on thighs x4 from elevated bed       Pain Assessment   Patient complains of pain: No         Patient Status   Activity Tolerance: Good  Oxygen Needs: Room Air  Response to Treatment: Tolerated treatment well  Compliance with Precautions: Not Applicable  Call light in reach: Yes  Presentation post treatment: Up in chair;Lines/drains intact;With family(w/ daughter)  Comments: Patient demo improved steadiness using Jr. FWW, instructed on sit to stand for LE strengthening, encouraged to continue routine ambulation and up in chair for meals w/ nursing 3x daily.    Interdisciplinary Communication   Interdisciplinary Communication: Nurse    Treatment Plan   Continue PT Treatment Plan with Focus on: Gait training;Therapeutic exercise;Balance training    PT Recommendations   Discharge Recommendation: Physical Therapy;1-3 x/week  Type of therapy: Home Health(transition to OP PT as soon as possible)  Supervision Recommended on Discharge: <8 hr/day;Initially, wean supervision as tolerated  Discharge concerns: Requires supervision for self care;Requires supervision for mobility  Discharge Equipment Recommended: Junior;Walker    Treatment Completed by: Elease Etienne, PT

## 2019-05-13 NOTE — Other
Patients Clinical Goal:   Clinical Goal(s) for the Shift: VS WNL, comfort/safety, pain control  Identify possible barriers to advancing the care plan: Treatment  Stability of the patient: Moderately Unstable - medium risk of patient condition declining or worsening    End of Shift Summary: Pt is AxO x4, calm and pleasant. C/o left shoulder pain; tylenol, analgesic balm, lidocaine patch, and heat packs applied for pain control. Covering MD notified of pt's uncontrolled shoulder pain; analgesic balm ordered and administered. E/W came to give trigger point injection as well. On RA, continuous pulse ox only and O2 WNL >92%. Non tele. Swallows meds whole with water. OOB to chair, worked with PT. Able to feed self with good appetite. Dtr updated at bedside. PIV running IVF at 75 cc/hr LR. Call light within reach, bed alarm on, and pt safety measures in place at all times.

## 2019-05-13 NOTE — Other
Patients Clinical Goal:   Clinical Goal(s) for the Shift: VSS, comfort, safety, pain management  Identify possible barriers to advancing the care plan:   Stability of the patient: Moderately Unstable - medium risk of patient condition declining or worsening    End of Shift Summary:  No distress noted, alert x 4, pain controlled, voltaren gel applied, no SOB noted, safety precaution observed, RA if awake, 1L NC if on deep sleep as oxygen saturation goes down to 85% RA, non tele, able to sleep within the shift, needs attended. Blood pressure 166/68, pulse 87, temperature 36.1 ?C (96.9 ?F), temperature source Temporal, resp. rate 17, height 1.549 m (5' 1''), weight 47.4 kg (104 lb 8 oz), SpO2 100 %.    GERIATRICS END OF SHIFT - DISCHARGE MARKERS of Instability Checklist  Nursing assessment:     Indicator   Cutoff Check if indicator needs to be addressed (meets cutoff)   Situation/Action/Comment   O2 requirement New since admission []     PO intake Less than 50% []     Urination None in past 8 hours/last shift []     Foley New since admission []     Pain Present  [x]   tylenol & voltaren gel given   Bowel movements  <1 in 2 days or >3 in 24 hours []     Delirium Unable to follow simple commands or participate with PT/OT []     Mobility Unable to stand and/or walk to bathroom []  Ambulated with minimal assist & FWW       BOOST / SAFE TRANSITION - DISCHARGE INDICATORS    Indicator Check if indicator has red ''P''    Situation/Action/Comment     Problems with Medications  [x]     Psychological  []     Principal Diagnosis  []     Physical Limitations  []     Poor Health Literacy  []     Patient Support  []     Prior Hospitalizations  [x]     Palliative Care  []       DELIRIUM PREVENTION  Protocols Strategies Check if implemented Comments   Risk factors >3 present- High risk []     Purposeful orientation Reorient, purposeful orienting conversation  Familiar objects in room []   []     Therapeutic activities Volunteer visit  Cognitive stimulation activities: games, reading, music []   []     Vision & hearing Assistance with: Leisure centre manager  Assistance with: hearing aids/hearing amplifier []   []     Feeding & hydration Assistance with feeding  Assistance with dentures []   []     Sleep hygiene Shades/blinds/lights on during day, limit naps during day  Quiet environment, consolidate care []     Mobilization BMAT 3-4: Ambulate TID  BMAT 2: OOB daily to chair for meals ? 2 hours, each time  BMAT 1: OOB to cardiac chair daily for meals ? 2 hours, each time []   []   []     Pain Non-narcotics ATC  Non-pharmacological: oil/aromatherapy, massage, music []   []     Maintain safety Fall precautions, volunteer visit, family at bedside, tele sitter, constant observer []     Manage agitation Redirect with calm, gentle voice and avoid confrontation  Avoid restraints and use alternative to restraints  Doll, music or animal therapy, as appropriate  Volunteer for companionship if safe and appropriate []   []   []   []       DELIRIUM: CAM (+)  Protocols Strategies Check if implemented Comments   New-onset MD contacted  Delirium order-set initiated  Bladder scan to R/O retention  Assess stool impaction  Medication reviewed with pharmacist []   []   []   []   []  MD Name:   Existing Manage and prevent further delirium []

## 2019-05-13 NOTE — Nursing Note
0730: Received pt resting in bed AxO x4, calm and cooperative. Weaned to RA and tolerating well, 95% O2 saturations. Non tele. Call light within reach, bed alarm on, and pt safety measures in place at all times.    2505: Tolerated PO meds whole with water. OOB in chair BMAT 3.    0930: Notified MD Dattoma and Pedly that pt's AM BG 54, ate breakfast and now 96. No new interventions at this time.     1106: MD aware of elevated BP, no new interventions at this time and will continue to monitor.

## 2019-05-13 NOTE — Other
Patients Clinical Goal:   Clinical Goal(s) for the Shift: vss, pain, comfort  Identify possible barriers to advancing the care plan:   Stability of the patient: Moderately Unstable - medium risk of patient condition declining or worsening    End of Shift Summary: Patient had uneventful shift, Ambulated half hallway, alert and oriented. Pain controlled with tylenol and lidoderm patch.  Daughter requested to talk to md, resident on call paged and she spoke to her over the phone.

## 2019-05-13 NOTE — Consults
GERIATRIC MEDICINE CLINICAL CASE MANAGER   DISCHARGE PLANNING PROGRESS NOTE  Department of Care Coordination        Per MD and Treatment Team, disposition would be Home with [facility] when clinically stable for discharge from hospital.     Kendall order completed by treatment team on [facility]. Tasked via [employer] with patient/family consent    Will continue to follow and assist with safe discharge planning with Care Coordination and Treatment Team as needed.         05/13/2019    1530 : lightning rounds - patient will dc home with daughter for a few weeks and will receive home health before moving back to [place] with daughter    Martin Majestic to bedside and spoke with Maudie Mercury, she confirms her address to where patient will dc to as :    [address]  [place], [place] [address]    Referral sent via [employer]    DME order sent to DME liaison via [employer]    See final dc note when task complete.    Will continue to follow and be available for CC needs.      Sharman Crate, RN BSN CCM 05/13/2019  Senior Case Manager, Department of Care Coordination and Clinical Social Work     Office: [phone]                           Fax: [phone]                                          Pager: [phone]

## 2019-05-13 NOTE — Consults
[facility] CONSULTATION    DATE OF SERVICE: 05/13/2019  ~  ADMISSION DATE: 05/10/2019    ~ HOSPITAL DAY: 3  PRINCIPLE PROBLEM: <principal problem not specified> ~ PMD: Iran Planas, MD    PRIMARY TEAM: Medicine - Geriatrics  REQUESTING PHYSICIAN(S): Andrey Cota., MD    CC/REASON FOR CONSULTATION: No chief complaint on file.    HPI:   Lindsay Brennan is a 85 y.o. female with a h/o CKD and CML was admitted for dizziness and dyspnea in the setting of diltiazem dose adjustments and pna. She was found to have a profound leukcytosis, and there is concern for TLS. While this is being worked up, her primary pain complaint is of subacute L shoulder pain. There is identification of soft tissue tenderness on exam and XR findings of calcific tendinosis. EW was consulted to evaluate and treat any myofascial component to this pain syndrome.    She reports her pain to be in the L neck/shoulder region, although she originally felt the pain at the bra strap. The pain started insidiously 4 weeks ago without trauma or injury or strain. She underwent CSI of the L shoulder which nearly resolved her pain for 2 weeks, but then the pain returned and she is concerned for chronic progression of pain. XR was done today showing mild glenohumeral arthritis, anterior acromial spur, mild acromiohumeral narrowing and spur formation on the greater tuberosity suggestion chronic RTC disease. The pain is worse with movement and better with heat or diclofenac.    REVIEW OF SYSTEMS:  A complete review of 14 systems was deferred. Additional symptoms were otherwise negative and/or non-contributory, except as discussed above.    PRIOR RECORDS:  I have reviewed the relevant prior records in [facility], and summarized them as relevant in the HPI.    PAST MEDICAL HISTORY:  She has a past medical history of CKD (chronic kidney disease), GERD (gastroesophageal reflux disease), History of CVA (cerebrovascular accident) (2011), History of ductal carcinoma in situ (DCIS) of breast, Hyperlipidemia, Hypertension, Hypothyroidism, Leukemia (HCC/RAF), and Myelodysplasia (myelodysplastic syndrome) (HCC/RAF).    PAST SURGICAL HISTORY:  She has a past surgical history that includes Cesarean section; Total abdominal hysterectomy; and Mastectomy (Bilateral).    SOCIAL HISTORY:  She reports that she has never smoked. She has never used smokeless tobacco. She reports previous alcohol use. She reports that she does not use drugs.    FAMILY HISTORY:  Her family history includes Colon cancer in her brother and son; Emphysema in her father; Heart attack in her brother; Lung cancer in her sister and sister; Ovarian cancer in her sister; Stroke in her mother.    ALLERGIES:  is allergic to oxycodone-acetaminophen; penicillins; sulfa antibiotics; and naproxen.    HOME MEDICATIONS:  Facility-Administered Medications Prior to Admission   Medication Dose Route Frequency Provider Last Rate Last Admin   ? [DISCONTINUED] epoetin alfa-epbx (Retacrit) 10,000 units/mL inj 20,000 Units  20,000 Units Subcutaneous Once Boonpheng, Boonphiphop, MD         Medications Prior to Admission   Medication Sig Dispense Refill Last Dose   ? aspirin 81 mg EC tablet Take 81 mg by mouth daily.   05/09/2019 at Unknown time   ? cloNIDine 0.1 mg tablet Take 0.1 mg by mouth two (2) times daily.      ? dilTIAZem (CARDIZEM CD) 180 mg 24 hr capsule Take 180 mg by mouth daily.      ? epoetin alfa-epbx (RETACRIT) 10,000 units/mL injection Inject 20,000  Units under the skin Every month.      ? esomeprazole 40 mg capsule Take 40 mg by mouth daily .   05/09/2019 at Unknown time   ? Ferric Citrate 1 GM 210 MG(Fe) TABS Take 1 tablet by mouth three (3) times daily with meals. 270 tablet 1    ? gabapentin 100 mg capsule Take 200 mg by mouth at bedtime .   05/09/2019 at Unknown time   ? rosuvastatin 20 mg tablet Take 20 mg by mouth at bedtime .   05/10/2019 at Unknown time   ? sodium bicarbonate 650 mg tablet Take 1 tablet (650 mg total) by mouth two (2) times daily. 60 tablet 2 05/09/2019 at Unknown time   ? SYNTHROID 50 mcg tablet Take 50 mcg by mouth every other day Pt alternates with 75mg .      ? SYNTHROID 75 mcg tablet Take 75 mcg by mouth every other day Pt alternates with 50mg .   05/10/2019 at Unknown time   ? allopurinol 100 mg tablet Take 2 tablets (200 mg total) by mouth three (3) times daily. (Patient not taking: Reported on 05/11/2019.)   Not Taking at Unknown time   ? bisacodyl 5 mg EC tablet Take 1 tablet (5 mg total) by mouth daily as needed for Constipation. 100 tablet 0    ? heparin 5000 unit/mL injection Inject 1 mL (5,000 Units total) under the skin every twelve (12) hours.      ? hydrALAZINE 10 mg tablet Take 1 tablet (10 mg total) by mouth every six (6) hours.      ? lactated ringers IV soln Inject 50 mL/hr into the vein continuous. (Patient not taking: Reported on 05/11/2019.)   Not Taking at Unknown time   ? pantoprazole 40 mg DR tablet Take 1 tablet (40 mg total) by mouth daily.      ? polyethylene glycol powder packet Take 1 packet (17 g total) by mouth daily.      ? [DISCONTINUED] senna 8.6 mg tablet Take 1 tablet by mouth at bedtime as needed for Constipation. (Patient not taking: Reported on 05/11/2019.)   Not Taking at Unknown time       INPATIENT MEDICATIONS:  acetaminophen, 650 mg, Oral, Q6H  allopurinol, 50 mg, Oral, Daily  aspirin, 81 mg, Oral, Daily  ferrous sulfate, 325 mg, Oral, Every Other Day  gabapentin, 100 mg, Oral, QHS  heparin, 5,000 Units, Subcutaneous, Q12H  hydroxyurea, 500 mg, Oral, BID  levothyroxine, 50 mcg, Oral, Every Other Day  levothyroxine, 75 mcg, Oral, Every Other Day  lidocaine, 1 patch, Transdermal, Q24H  lisinopril, 10 mg, Oral, Daily  magnesium oxide, 400 mg, Oral, Daily  pantoprazole, 40 mg, Oral, Daily  rosuvastatin, 20 mg, Oral, QHS  PRNs: bisacodyl, diclofenac Sodium, hydrALAZINE, lidocaine PF, magnesium hydroxide, melatonin, senna    VITALS:  Temp:  [36.1 ?C (96.9 ?F)-36.6 ?C (97.9 ?F)] 36.6 ?C (97.9 ?F)  Heart Rate:  [87-94] 93  Resp:  [14-19] 18  BP: (148-180)/(58-73) 167/63  NBP Mean:  [98-101] 101  SpO2:  [85 %-98 %] 96 %     Weight: 47.4 kg (104 lb 8 oz) Oxygen Therapy  SpO2: 96 %  O2 Device: None (Room air)  Flow Rate (L/min): 1 L/min     I/O last 2 completed shifts:  In: 2618.5 [P.O.:476; I.V.:2042.5; Other:100]  Out: 1950 [Urine:1950]    PHYSICAL EXAM:  General appearance: well developed, no acute distress  Eyes: normal conjunctivae and lids, EOMI  Ears, Nose,  Mouth & Throat: normal external ears and nares, oropharynx without lesions  Neck: symmetric, no thyroid enlargement  Respiratory: normal respiratory effort  Cardiovascular:  no extremity edema  Musculoskeletal: myofascial trigger/tender points: left trapezius and supraspinatus trigger points identified with related taut bands, palpation of which reproduces her pain.  Skin: no rashes, no induration  Psychiatric: oriented to time, place, and person, mood and affect normal    LABS:  I have review the pertinent laboratory data.  Lab Results   Component Value Date    WBC 115.46 (H) 05/13/2019    HGB 8.2 (L) 05/13/2019    HCT 27.3 (L) 05/13/2019    MCV 92.9 05/13/2019    PLT 174 05/13/2019     PT/INR/APTT/Fib:  18.0/1.6/54.6/216 (04/08 0720)    IMAGING:  I have reviewed pertinent imaging data.  XR reviewed - see hpi    TREATMENTS:  PROCEDURE:  Trigger Point Injection(s)  INDICATIONS:    [x]  Pain as noted in HPI  [x]  Pain or altered sensation in the expected distribution of referred pain from a trigger point  [x]  Taut band palpable in an accessible muscle with exquisite tenderness at one point along the length of it  [x]  Some degree of restricted motion, when measurable  [x]  Reproduction of referred pain pattern by stimulating the trigger pint  []  Altered sensation by pressure on the tender spot  []  Local response elicited by snapping palpation at the tender spot or by needle insertion in to the tender spot  []  Pain alleviated by stretching or injecting the tender spot     Informed Consent Discussion:     A discussion regarding the potential benefits, risks, rationale and reasonable alternatives for the above named procedure was conducted with patient or patient surrogate.    Procedure:     The patient is positioned appropriately and the area prepped with isopropyl alcohol. After identification of trigger point injections by palpation, a 0.5 inch 25 gauge or 1 inch 23 gauge needle is inserted through the skin into the muscle. The needle is advanced a short distance, about 1-3 cm observing for any complaints of paresthesias but searching for the area of maximum tenderness. If any complaints or paraesthesia are encountered the needle is withdrawn slightly until they stop. Next 0.1-0.2 cc of 1% lidocaine was infiltrated in a fanwise method into each trigger point after aspiration is negative for blood. Post procedure the injection area is cleansed and hemostasis achieved.    Muscle groups injected: left trapezius    Post procedure:      COMPLICATION(S):  None    CONDITION: Patient tolerated the procedure well      ASSESSMENT:  Lindsay Brennan is a 85 y.o. female with CML admitted for profound leukocytosis, with additional L shoulder myofascial pain syndrome vs acute aggravation of chronic RTC pathology.      The following problems are being addressed in this hospitalization:  Current Hospital Problems           POA    Hyperleukocytosis Yes          1. L shoulder pain  2. L RTC disease    RECOMMENDATIONS:  Trial trigger point injection today for diagnostic and therapeutic reasons to elucidate and treat the myofascial component of pain of the L trapezius. She requested we proceed slowly, so only a single injection was done today using a 25g (small) needle. Will re-evaluate tomorrow and repeat or add additional areas such as supraspinatus, as necessary.    Thanks  for involving [place] in the care of this patient     AUTHOR:  Alric Quan, MD  05/13/2019 at 11:45 AM  [facility] Consult Pager: [phone]

## 2019-05-14 DIAGNOSIS — D631 Anemia in chronic kidney disease: Secondary | ICD-10-CM

## 2019-05-14 DIAGNOSIS — N189 Chronic kidney disease, unspecified: Secondary | ICD-10-CM

## 2019-05-14 LAB — Uric Acid
URIC ACID: 8 mg/dL — ABNORMAL HIGH (ref 2.9–7.0)
URIC ACID: 8 mg/dL — ABNORMAL HIGH (ref 2.9–7.0)

## 2019-05-14 LAB — FISH

## 2019-05-14 LAB — APTT
APTT: 69.4 s — ABNORMAL HIGH (ref 24.4–36.2)
APTT: 53.4 s — ABNORMAL HIGH (ref 24.4–36.2)

## 2019-05-14 LAB — Fibrinogen
FIBRINOGEN: 215 mg/dL — ABNORMAL LOW (ref 235–490)
FIBRINOGEN: 226 mg/dL — ABNORMAL LOW (ref 235–490)

## 2019-05-14 LAB — Basic Metabolic Panel
CALCIUM: 9.4 mg/dL (ref 8.6–10.4)
CHLORIDE: 105 mmol/L (ref 96–106)
GFR ESTIMATE FOR NON-AFRICAN AMERICAN: 24 mL/min/{1.73_m2} (ref 96–106)
TOTAL CO2: 24 mmol/L (ref 20–30)

## 2019-05-14 LAB — Prothrombin Time Panel
INR: 1.5 s (ref 11.5–14.4)
PROTHROMBIN TIME: 17.7 s — ABNORMAL HIGH (ref 11.5–14.4)

## 2019-05-14 LAB — Differential, Manual
ABSOLUTE METAMYELO CT,MANUAL: 5.4 10*3/uL — ABNORMAL HIGH (ref 0.0–0.0)
METAMYELOCYTE: 8 (ref 0.0–0.0)

## 2019-05-14 LAB — Phosphorus
PHOSPHORUS: 4.1 mg/dL (ref 2.3–4.4)
PHOSPHORUS: 4.9 mg/dL — ABNORMAL HIGH (ref 2.3–4.4)

## 2019-05-14 LAB — UA,Dipstick: NITRITE: NEGATIVE (ref 5.0–8.0)

## 2019-05-14 LAB — Calcium,Ionized
IONIZED CA++,CORRECTED: 1.25 mmol/L (ref 1.09–1.29)
IONIZED CA++,UNCORRECTED: 1.16 mmol/L (ref 1.09–1.29)

## 2019-05-14 LAB — UA,Microscopic

## 2019-05-14 LAB — CBC
MEAN CORPUSCULAR VOLUME: 91.4 fL (ref 79.3–98.6)
PLATELET COUNT, AUTO: 202 10*3/uL (ref 143–398)
RED BLOOD CELL COUNT: 3.25 x10E6/uL — ABNORMAL LOW (ref 3.96–5.09)
RED CELL DISTRIBUTION WIDTH-SD: 74.6 fL — ABNORMAL HIGH (ref 36.9–48.3)

## 2019-05-14 LAB — D-Dimer
D-DIMER STAGO: 2.4 ug{FEU}/mL — ABNORMAL HIGH (ref <0.60)
D-DIMER STAGO: 2.52 ug{FEU}/mL — ABNORMAL HIGH (ref <0.60)

## 2019-05-14 MED ADMIN — GRX ANALGESIC BALM EX OINT: TOPICAL | @ 16:00:00 | Stop: 2019-06-13

## 2019-05-14 MED ADMIN — DICLOFENAC SODIUM 1 % EX GEL: TOPICAL | @ 04:00:00 | Stop: 2019-05-19 | NDC 46122064654

## 2019-05-14 MED ADMIN — POTASSIUM CHLORIDE ER 10 MEQ PO TBCR: 10 meq | ORAL | @ 06:00:00 | Stop: 2019-05-14 | NDC 00245531689

## 2019-05-14 MED ADMIN — HYDROXYUREA 500 MG PO CAPS: 500 mg | ORAL | @ 12:00:00 | Stop: 2019-05-14 | NDC 00904693961

## 2019-05-14 MED ADMIN — ACETAMINOPHEN 325 MG PO TABS: 650 mg | ORAL | @ 19:00:00 | Stop: 2019-05-15 | NDC 50580045811

## 2019-05-14 MED ADMIN — GABAPENTIN 100 MG PO CAPS: 100 mg | ORAL | @ 04:00:00 | Stop: 2019-05-19 | NDC 60687058001

## 2019-05-14 MED ADMIN — HYDROXYUREA 500 MG PO CAPS: 500 mg | ORAL | @ 01:00:00 | Stop: 2019-05-14 | NDC 00904693961

## 2019-05-14 MED ADMIN — ROSUVASTATIN CALCIUM 10 MG PO TABS: 20 mg | ORAL | @ 04:00:00 | Stop: 2019-06-10 | NDC 60687024501

## 2019-05-14 MED ADMIN — ROSUVASTATIN CALCIUM 20 MG PO TABS: 20 mg | ORAL | @ 04:00:00 | Stop: 2019-05-19 | NDC 60687024501

## 2019-05-14 MED ADMIN — ALLOPURINOL 50 MG PO TABS: 50 mg | ORAL | @ 16:00:00 | Stop: 2019-05-17 | NDC 00603211521

## 2019-05-14 MED ADMIN — ASPIRIN 81 MG PO CHEW: 81 mg | ORAL | @ 16:00:00 | Stop: 2019-05-19 | NDC 66553000201

## 2019-05-14 MED ADMIN — DICLOFENAC SODIUM 1 % EX GEL: TOPICAL | @ 19:00:00 | Stop: 2019-05-19 | NDC 46122064654

## 2019-05-14 MED ADMIN — HEPARIN SODIUM (PORCINE) 5000 UNIT/ML IJ SOLN: 5000 [IU] | SUBCUTANEOUS | @ 04:00:00 | Stop: 2019-05-18 | NDC 00641040012

## 2019-05-14 MED ADMIN — PANTOPRAZOLE SODIUM 40 MG PO TBEC: 40 mg | ORAL | @ 16:00:00 | Stop: 2019-05-19 | NDC 50268063915

## 2019-05-14 MED ADMIN — LISINOPRIL 5 MG PO TABS: 10 mg | ORAL | @ 16:00:00 | Stop: 2019-05-15 | NDC 68084019601

## 2019-05-14 MED ADMIN — MAGNESIUM OXIDE 400 (240-241.3 MG) MG (MULTI-GPI) PO TABS: 400 mg | ORAL | @ 16:00:00 | Stop: 2019-06-09 | NDC 64980033901

## 2019-05-14 MED ADMIN — HEPARIN SODIUM (PORCINE) 5000 UNIT/ML IJ SOLN: 5000 [IU] | SUBCUTANEOUS | @ 16:00:00 | Stop: 2019-05-19 | NDC 00641040012

## 2019-05-14 MED ADMIN — LIDOCAINE 5 % EX PTCH: 1 | TRANSDERMAL | @ 09:00:00 | Stop: 2019-05-19

## 2019-05-14 MED ADMIN — ACETAMINOPHEN 325 MG PO TABS: 650 mg | ORAL | @ 12:00:00 | Stop: 2019-05-15 | NDC 50580045811

## 2019-05-14 MED ADMIN — ACETAMINOPHEN 325 MG PO TABS: 650 mg | ORAL | @ 02:00:00 | Stop: 2019-05-15 | NDC 50580045811

## 2019-05-14 MED ADMIN — ACETAMINOPHEN 325 MG PO TABS: 650 mg | ORAL | @ 09:00:00 | Stop: 2019-05-15

## 2019-05-14 MED ADMIN — LIDOCAINE 5 % EX PTCH: 1 | TRANSDERMAL | @ 19:00:00 | Stop: 2019-05-19 | NDC 00591352530

## 2019-05-14 MED ADMIN — LEVOTHYROXINE SODIUM 50 MCG PO TABS: 50 ug | ORAL | @ 16:00:00 | Stop: 2019-05-19 | NDC 51079044020

## 2019-05-14 NOTE — Progress Notes
Physical Therapy Treatment      PATIENT: Lindsay Brennan  MRN: [id number]    Treatment Date: 05/14/2019    Patient Presentation: Up in chair;IV;Pulse ox;Family/CG present(diaper donned, daughter [name] present)    Pertinent Updates: per xray of L shoulder:Mild glenohumeral osteoarthritis. Anterior acromial spur, mild acromiohumeral narrowing and spur formation on the greater tuberosity suggest chronic rotator cuff disease.    Precautions   Precautions: Fall risk;Monitor Vitals;Check Labs  Orthotic: None  Current Activity Order: Activity as tolerated  Weight Bearing Status: Not Applicable    Cognition   Cognition: Within Defined Limits  Safety Awareness: Good awareness of safety precautions  Barriers to Learning: None       Functional Mobility   Sit to Stand: Stand by Assist  Ambulation: Stand by Assist  Ambulation Distance (Feet): 350 feet, mildly unsteady, but no LOB, flexed posture  Gait Pattern: Decreased pace;Decreased heel-toe;Modified step-through  Assistive Device: Junior;Front wheeled walker                   Pain Assessment   Patient complains of pain: No                   Patient Status   Activity Tolerance: Good  Oxygen Needs: Room Air  Response to Treatment: Tolerated treatment well;Fatigued;with activity;Resolved with rest  Compliance with Precautions: Not Applicable  Call light in reach: Yes  Presentation post treatment: Up in chair;Pulse Ox;Lines/drains intact;With family;Chair alarm on  Comments: Pt is motivated for PT.  Practiced sit <-> stand 3x. Discussed home safety precaution.  Left pt up in the chair w/ daughter present. Pt to dc home to daughter's house and pt does not need to negotiate stairs/steps.    Interdisciplinary Communication   Interdisciplinary Communication: Nurse(Jessica, RN)    Treatment Plan   Continue PT Treatment Plan with Focus on: Gait training;Therapeutic exercise;Balance training    PT Recommendations   Discharge Recommendation: Physical Therapy;1-3 x/week  Type of therapy: Home Health  Supervision Recommended on Discharge: <8 hr/day;Initially, wean supervision as tolerated  Discharge Equipment Recommended: Junior;Walker  Equipment ordered: DME delivered    Treatment Completed by: Minda Meo, PTA

## 2019-05-14 NOTE — Nursing Note
1930-Received Pt a&ox4, ambulatied steady with FWW and standby assist to BR, voided and had x1 soft stool. Endorses chronic left shoulder pain but calm, NAD. Fall precaution, call light within reach. Family/daughter at bedside.    2146-MD Tomi Likens called and given Pt's left shoulder pain status. MD stated he will endorse to the Primary team in the am.

## 2019-05-14 NOTE — Progress Notes
RENAL CONSULTATION INPATIENT  Referring Physician: Early Osmond  CC: CKD    Interval   05/13/2019 complains of shoulder pain on the right which is overshadowing the dizziness    4/7:  BP elevated. Started hydroxyurea    History of Present Illness  85 y.o. F with proteinuric CKD admitted for dizziness and shortness of breath, found to have hyperleukocytosis.     Over the past few years the creatinine has gradually trended up into the 2s. A few years ago a nephrologist in [place] (her regular nephrologist) advised her to stop lisinopril in the setting of getting a colonoscopy, ''because the kidney disease is very advanced''. She had undergone a renal artery angiogram which showed nonobstructive atherosclerosis. There is mention of rapidly progressive kidney disease but the records I was able to see in care everywhere show a more slow rise in creatinine over the past few years.     She has been visiting her daughter [name] (who is at the bedside) for the past several months and during this time has been following the CKD clinic in [place]. They recently started her on diltiazem for the proteinuria which correlated with onset of the dizziness, though the dilt was reduced, and then stopped prior to this admission, but she does not feel any better.     She does report a remote hx of kidney stones assoc with renal colic. However there have been no recent events of this.     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     She has a hx of HTN     Medications  Allergies   Allergen Reactions   ? Oxycodone-Acetaminophen Other (See Comments)   ? Penicillins Hives   ? Sulfa Antibiotics Agranulocytosis   ? Naproxen Rash     Aleve     ? acetaminophen  650 mg Oral Q6H   ? allopurinol  50 mg Oral Daily   ? aspirin  81 mg Oral Daily   ? ferrous sulfate  325 mg Oral Every Other Day   ? gabapentin  100 mg Oral QHS   ? heparin  5,000 Units Subcutaneous Q12H   ? hydroxyurea  500 mg Oral BID   ? levothyroxine  50 mcg Oral Every Other Day   ? levothyroxine  75 mcg Oral Every Other Day   ? lidocaine  1 patch Transdermal Q24H   ? lisinopril  10 mg Oral Daily   ? magnesium oxide  400 mg Oral Daily   ? pantoprazole  40 mg Oral Daily   ? potassium chloride  10 mEq Oral Once   ? rosuvastatin  20 mg Oral QHS     analgesic balm, bisacodyl, diclofenac Sodium, hydrALAZINE, lidocaine PF, magnesium hydroxide, melatonin, senna    EXAM  BP 166/64  ~ Pulse 94  ~ Temp 36.8 ?C (98.2 ?F) (Temporal)  ~ Resp 16  ~ Ht 1.549 m (5' 1'')  ~ Wt 47.4 kg (104 lb 8 oz)  ~ SpO2 93%  ~ BMI 19.75 kg/m?     Intake/Output Summary (Last 24 hours) at 05/13/2019 2159  Last data filed at 05/13/2019 2130  Gross per 24 hour   Intake 2978.75 ml   Output 2550 ml   Net 428.75 ml     General: NAD, well-developed  Eyes: Conjunctiva and lids normal; Pupils equal, Anicteric  ENMT: normal facial anatomy with no traumatic lesions; normal mucosa, no discharge  Neck: supple, trachea midline, no thyromegaly  Respiratory: Normal work of breathing, CTAB  Cardiovascular: regular, no added heart sounds; JVP normal; no edema  Abdomen: soft, NT/ND, no HSM  Vascular: symmetric 2+ pedal pulses; no ulcerations  Skin: warm and dry; normal coloration  Neuro: CN intact; moves all ext  Psych: alert, oriented; normal affect; appropriate judgement and insight    LABS  Lab Results   Component Value Date    CREAT 2.05 (H) 05/13/2019    BUN 49 (H) 05/13/2019    NA 139 05/13/2019    K 3.3 (L) 05/13/2019    CL 102 05/13/2019    CO2 24 05/13/2019       ASSESSMENT  85 y.o. delightful female with CKD of unknown cause, characterized by slow progression, subnephrotic proteinuria, and HTN. Admitted for dizziness likely due to hyperleukocytosis.    1. CKD stage 4 with proteinuria and intermittent microhematuria, mild bump in creat v fluctuations due to leukostasis. Unclear cause.   2. 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 Coagulopathy   4. Hyponatremia mild   5. R hydro mild ? Significance. Not likely causing AKI. Could be sequella from prior stone   6. HTN  7. Prior kidney stones (Seen R side ultrasound 2020, per report) - treated with allopurinol for a time now off        RECOMMENDATIONS  Cont to monitor renal fct for response to WBC lowering with hydroxyurea  Will continue lisinopril (previously well tolerated) for HTN and protienuria  For now, ok to cont hydral prn     Elevated risk of recurrent AKI:  - Avoid nephrotoxic injury  - Dose all medications to current eGFR    Thank you for involving me in the care of this pleasant and interesting patient.    Clementeen Graham, MD

## 2019-05-14 NOTE — Consults
FINAL DISCHARGE MULTIDISCIPLINARY NOTE  Department of Care Coordination      Admit [id number]  Anticipated Date of Discharge: 05/17/2019    Following PJ:KDTOIZT, Vaughan Basta., MD    Home [place] [address]      DISCHARGE INFORMATION:     Discharge Address: [address], [address], [place], [place] [address]    Individual(s) notified of discharge plan:  Contact Name: Mabelle Mungin Relationship: Daughter   Contact Number(s): [phone]      Is patient/family informed of discharge?: Yes Is patient/family agreeable of discharge destination?: Yes     Support Systems: Family       Medicare Important Message Provided: Yes       [facility] Choice List: Provided to Pt/Family Date Provided: 05/14/19   Freedom of Choice: Educated and Provided, [place]       Final Discharge Needs: [place] and/or [place] Coordination (if applicable):   Agency Name: [place], [place]  Contact Person: AIDIN  Phone Number(s): [phone]  Fax Number: [phone]  Start of Care Date: 05/18/19  Services: Nursing, Physical Therapy, Occupational Therapy    Home Durable Medical and/or Respiratory Equipment (if applicable):   Vendor: ([place] [phone])  Equipment Provided: Junior Walker  Delivery Arrangements: To Bedside from Albert Lea, RN BSN CCM 05/14/2019   Senior Case Manager, Department of Care Coordination and Clinical Social Work     Office: [phone]                           Fax: [phone]                                          Pager: [phone]

## 2019-05-14 NOTE — Consults
IP GERIATRIC MEDICINE CLINICAL CASE MANAGER   ACTIVE DISCHARGE PLANNING PROGRESS NOTES  Department of Care Coordination        Admit [id number]  Anticipated Date of Discharge: 05/17/2019    Following TG:GYIRSWN, Vaughan Basta., MD      Today's short update           05/14/2019 1157 AM - Per MD and Treatment Team, disposition would be Home with [facility] + DME when clinically stable for discharge from hospital.     [place] order completed by treatment team on [facility] Connect. Tasked via [employer] with patient/family consent    Will continue to follow and assist with safe discharge planning with Care Coordination and Treatment Team as needed.           Disposition     Home with Home Health, [address] [place], [address], [place] [address]    Home Health Coordination Status (if applicable)     Choices presented to patient (5/6) - AIDIN choice list provided to patient       DME or RT Equipment Status (if applicable)     DME Provider Status (5/6), ETA (please comment) (6/6)(delivered to bedside)             Haden Suder Massie Bougie, RN, MSN, MHA, PHN, IQCI, HIT 05/14/2019  Geriatric Medicine Clinical Case Manager, Department of Care Coordination and Clinical Social Work     Geriatrics Unit: [phone]                           Fax: [phone]                                          Pager: [phone]

## 2019-05-14 NOTE — Consults
Inpatient Hematology/Oncology Consultation    Patient name:  Lindsay Brennan  MRN:  [id number]  DOB:  [birth year]  Location: 5228/1  Date of service:  05/13/2019    Reason for consultation: CMML  Primary service: Geriatrics   Referring provider: Iran Planas, MD  PCP: Iran Planas, MD  Outpatient hematologist/oncologist (if any): Dr. Osvaldo [facility] of [place] [phone]    History of Presenting Illness:  Lindsay Brennan is a 85 y.o. 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 Per the patient, lightheadedness has time correlation with diltiazem, improving with decrease in the dosage, and only occurring when she rises from seated position. She was planning to go back home to [place] but mentioned dizziness to her family who reportedly found her O2 saturation to be 80's at home, prompting admission. She says she only has dyspnea when lying flat, but improves in supine. She also endorses cough for the past 2 days. COVID negative.   ?  [facility], evaluated by oncology service, started on IVF and allopurinol for uric acid 11.8. other notable labs include LDH 1426, d-dimer 2.4, fibrinogen 271, PT 20.4    CXR demonstrated bilateral lower lobe consolidations  abd [place] with marked splenomegaly  ?  05/11/19 transferred to [facility] to geriatrics service.   Patient reports weight loss, occasional night sweats, early satiety. Continues to have dizziness and SOB with exertion.     05/12/19 started hydrea 500mg  BID yesterday. Breathing and dizziness is stable    05/13/19 no overnight events WBC down to 115    ROS:  A complete 14 point review of systems has been is negative except for as documented.    Past Medical History:  Past Medical History:   Diagnosis Date   ? CKD (chronic kidney disease)    ? GERD (gastroesophageal reflux disease)    ? History of CVA (cerebrovascular accident) 2011   ? 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 Hyperlipidemia    ? Hypertension    ? Hypothyroidism    ? Leukemia (HCC/RAF)    ? Myelodysplasia (myelodysplastic syndrome) (HCC/RAF)        Past Surgical History:  Past Surgical History:   Procedure Laterality Date   ? CESAREAN SECTION     ? MASTECTOMY Bilateral    ? TOTAL ABDOMINAL HYSTERECTOMY         Social History:  Social History     Tobacco Use   ? Smoking status: Never Smoker   ? Smokeless tobacco: Never Used   Substance Use Topics   ? Alcohol use: Not Currently     Family History:  Family History   Problem Relation Age of Onset   ? Stroke Mother    ? Emphysema Father    ? Ovarian cancer Sister    ? Heart attack Brother    ? Lung cancer Sister    ? Lung cancer Sister    ? Colon cancer Brother    ? Colon cancer Son        Allergies:  Allergies   Allergen Reactions   ? Oxycodone-Acetaminophen Other (See Comments)   ? Penicillins Hives   ? Sulfa Antibiotics Agranulocytosis   ? Naproxen Rash     Aleve       Outpatient Medications:  Facility-Administered  Medications Prior to Admission   Medication Dose Route Frequency Provider Last Rate Last Admin   ? [DISCONTINUED] epoetin alfa-epbx (Retacrit) 10,000 units/mL inj 20,000 Units  20,000 Units Subcutaneous Once Boonpheng, [place], MD         Medications Prior to Admission   Medication Sig Dispense Refill Last Dose   ? epoetin alfa-epbx (RETACRIT) 10,000 units/mL injection Inject 20,000 Units under the skin Every month.      ? esomeprazole 40 mg capsule Take 40 mg by mouth daily .   05/09/2019 at Unknown time   ? Ferric Citrate 1 GM 210 MG(Fe) TABS Take 1 tablet by mouth three (3) times daily with meals. 270 tablet 1    ? rosuvastatin 20 mg tablet Take 20 mg by mouth at bedtime .   05/10/2019 at Unknown time   ? sodium bicarbonate 650 mg tablet Take 1 tablet (650 mg total) by mouth two (2) times daily. 60 tablet 2 05/09/2019 at Unknown time   ? SYNTHROID 50 mcg tablet Take 50 mcg by mouth every other day Pt alternates with 75mg .      ? SYNTHROID 75 mcg tablet Take 75 mcg by mouth every other day Pt alternates with 50mg .   05/10/2019 at Unknown time   ? [DISCONTINUED] aspirin 81 mg EC tablet Take 81 mg by mouth daily.   05/09/2019 at Unknown time   ? [DISCONTINUED] cloNIDine 0.1 mg tablet Take 0.1 mg by mouth two (2) times daily.      ? [DISCONTINUED] dilTIAZem (CARDIZEM CD) 180 mg 24 hr capsule Take 180 mg by mouth daily.      ? [DISCONTINUED] gabapentin 100 mg capsule Take 200 mg by mouth at bedtime .   05/09/2019 at Unknown time   ? bisacodyl 5 mg EC tablet Take 1 tablet (5 mg total) by mouth daily as needed for Constipation. 100 tablet 0    ? polyethylene glycol powder packet Take 1 packet (17 g total) by mouth daily.      ? [DISCONTINUED] allopurinol 100 mg tablet Take 2 tablets (200 mg total) by mouth three (3) times daily. (Patient not taking: Reported on 05/11/2019.)   Not Taking at Unknown time   ? [DISCONTINUED] heparin 5000 unit/mL injection Inject 1 mL (5,000 Units total) under the skin every twelve (12) hours.      ? [DISCONTINUED] hydrALAZINE 10 mg tablet Take 1 tablet (10 mg total) by mouth every six (6) hours.      ? [DISCONTINUED] lactated ringers IV soln Inject 50 mL/hr into the vein continuous. (Patient not taking: Reported on 05/11/2019.)   Not Taking at Unknown time   ? [DISCONTINUED] pantoprazole 40 mg DR tablet Take 1 tablet (40 mg total) by mouth daily.      ? [DISCONTINUED] senna 8.6 mg tablet Take 1 tablet by mouth at bedtime as needed for Constipation. (Patient not taking: Reported on 05/11/2019.)   Not Taking at Unknown time     Inpatient Medications:  Scheduled Meds:  ? acetaminophen  650 mg Oral Q6H   ? allopurinol  50 mg Oral Daily   ? aspirin  81 mg Oral Daily   ? ferrous sulfate  325 mg Oral Every Other Day   ? gabapentin  100 mg Oral QHS   ? heparin  5,000 Units Subcutaneous Q12H   ? hydroxyurea  500 mg Oral BID   ? levothyroxine  50 mcg Oral Every Other Day   ? levothyroxine  75 mcg Oral Every Other Day   ?  lidocaine  1 patch Transdermal Q24H   ? lisinopril  10 mg Oral Daily   ? magnesium oxide  400 mg Oral Daily   ? pantoprazole  40 mg Oral Daily   ? rosuvastatin  20 mg Oral QHS     Continuous Infusions:  ? lactated ringers 75 mL/hr (05/13/19 1541)     PRN Meds:.analgesic balm, bisacodyl, diclofenac Sodium, hydrALAZINE, lidocaine PF, magnesium hydroxide, melatonin, senna    Vital signs:  Vitals:    05/13/19 0642 05/13/19 0700 05/13/19 1058 05/13/19 1545   BP:  164/73 167/63 169/61   Patient Position: Lying Lying Sitting    Pulse:  92 93 94   Resp:  18 18 18    Temp:  36.3 ?C (97.3 ?F) 36.6 ?C (97.9 ?F) 36.6 ?C (97.9 ?F)   TempSrc:  Temporal Temporal Temporal   SpO2: 96% (!) 91% 96% 95%   Weight:       Height:         Vital sign ranges (24h):  Temp:  [36.1 ?C (96.9 ?F)-36.6 ?C (97.9 ?F)] 36.6 ?C (97.9 ?F)  Heart Rate:  [87-94] 94  Resp:  [14-19] 18  BP: (148-180)/(58-73) 169/61  NBP Mean:  [97-101] 97  SpO2:  [85 %-98 %] 95 %    Intake/Output (24h):    Intake/Output Summary (Last 24 hours) at 05/13/2019 1754  Last data filed at 05/13/2019 1648  Gross per 24 hour   Intake 2468.75 ml   Output 2100 ml   Net 368.75 ml       Physical Exam:  General: Appears well-developed, well-nourished and close to stated age.   CV: Regular in rate and rhythm, no murmurs or gallops.   Chest: Clear to auscultation bilaterally without wheezing or rhonchi. No crackles noted. Respiratory effort appears normal.   Abdomen: Soft, nontender and nondistended. Bowel sounds are present and normoactive. +splenomegaly  Musculoskeletal: No edema. No cyanosis. Extremities are warm and well-perfused.   Psychiatric: Affect appropriate.  Pleasant and conversant.     Labs:    Lab Results   Component Value Date    ALT 14 05/10/2019    AST 32 05/10/2019    ALKPHOS 145 (H) 05/10/2019    BILITOT 0.6 05/10/2019     Lab Results   Component Value Date    CREAT 2.05 (H) 05/13/2019    BUN 49 (H) 05/13/2019    NA 139 05/13/2019    K 3.3 (L) 05/13/2019    CL 102 05/13/2019    CO2 24 05/13/2019     Lab Results   Component Value Date    WBC 127.35 (H) 05/13/2019    HGB 8.6 (L) 05/13/2019    HCT 28.2 (L) 05/13/2019    MCV 91.9 05/13/2019    PLT 187 05/13/2019    NEUTPCT 52 01/18/2019    NEUTABS 32.2 (H) 01/18/2019    MONOPCT 14 01/18/2019    MONOABS 8.5 (H) 01/18/2019     Coags:  Recent Labs     05/13/19  1218 05/13/19  0720 05/12/19  1952   PT 17.6* 18.0* 16.8*   INR 1.5 1.6 1.4   APTT 64.3* 54.6* 49.1*   FIBRINOGEN 229* 216* 223*   DDIMER 2.37* 2.37* 2.47*     Flow cytometry  INTERPRETATION:  Small population of myeloblasts (1% of total events)   ?  COMMENT:  Marked leukocytosis with neutrophilia, monocytosis and rare blasts. Please correlate with FISH studies.    Pertinent imaging:  05/10/19 abd US  IMPRESSION:  1. Marked splenomegaly.  Mild hepatomegaly.  2. Borderline right hydronephrosis.  ?  05/09/19 CXR  IMPRESSION:  ?  Stable cardiomediastinal silhouette with mildly tortuous, calcified thoracic aorta. Persistent mild cardiomegaly.  Normal lung volumes. Increased density of the bilateral lower lung zones is accentuated due to overlying breast implants.  There are bilateral lower lobe patchy consolidations, most suggestive of pulmonary edema versus pneumonia.   No right pleural effusion. Trace left pleural effusion. No pneumothorax. Elevation of the left hemidiaphragm  No acute osseous abnormalities.  ?    Pertinent pathology:  [place] Name: Lindsay Brennan, Lindsay Brennan Accession#: [id number] Med. Rec. # [id number] Taken: 10/30/2016 Encounter #: [id number] Received: 10/30/2016 Gender: F Reported: 11/04/2016 16:53 DOB: 02/04/1935 (Age: 53)  Submitting Physician: Hubert Azure D M.D.  Additional Physician(s):  Location: [facility]  Clinical Information:  85 year old with remota history of mild pancytopenia in 2008. There ara no recent history or CBC studies.  Specimen Received:  A: Bone marrow,aspirate for clot section, pic,cyto sent to iu B: Bone marrow, biopsy (decalelfied),Ipic  ?  Final  Pathologic Diagnosis:  id B. Bone marrow, bio and aspirate for clot section, Jeft posterioriliac crest: Myeloid neoplasm. See note.  As the senior physician, ~ attest that I: (i) examined the relevant preparation(s) for the specimen(s); and (ii) rendered or confirmed the diagnosis(es).  ?Electronically Signed ngz1/10/30/2016 Jobie Quaker, M.D. (Pathologist)  Deanne Coffer, M.D. 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 Clinical correlation and correlation with cytogenetics/FISH study including BCR-ABL7 fusion study is recommended for further classification.  Dr. 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  ?  Cellularity: Markedly hypercellular (> 90%) Myslopoiesis: Markedly increased with full maturation Erythropoiesis: Decreased Megakaryopoiesis: Markedly increased with frequent hypolobated/monolobated megakaryocytes including small hyperchromatic forms Additional studles: Reticulin stain shows mild increasein reticulin fibrosis (1+) with appropriate control,  Theimmunohistechemical stains are performedin this tase and same of the antigens may also ba evaluated by flow cytometry, Concument immunchistechemieal stains on tissue sections are Indicated in order to correlate immunophenotype with cell morphology, evaluate spatial dletribution/architectural features and/or for a quantitation of disease involvement  Bone marrow,aspirate smear:  Sample quality: Adequate  ME ratio: Markedly increased, ~ 8:1 Blasts: Not increased (4%) ' Myelopoiesis: Markedly increased with occasional hypolobated neutrophils Erythropoiesis: Decreased with megaloblastoid changes Megakaryopoiesis: Markedly increased with frequent hypolobated/monolobated megakaryocytes Comments: No clusters of blasts, no Auer rods. Additional studies: Iron stain performed at the [facility] showsincreased storage iron with no ring sideroblasts. Control stains appropriately,  Bone marrow differential:  Percent  Blasts 2  Promyeltocytes 7  Myelocytes 23  Metamyelocytes 6  Bands 16  Polys 30  Lymphocytes 3  Monocytes 2  Eosinophils 4  Basophils 1  Nucleated RBCs 10  Peripheral blood, smear:  CBC: WBC 46 k/cumm, hemoglobIn 10,7 g/dL, MCV 81 fL, ROW 84.6%, platelet count 316 k/cumm Differential: Neutrophils 57%, lymphocytes 5%, monocytes 21%, myelocytas 3%, Metamyelocytes 8%, band 6%  RBCs: Normocytic anaemia with anlsopgikilocytosis. WEBCs: Leucocytosis, granulocytosis with left-shift, monocytosis. Platelets: Normal platelet caunt with occasional giantplatelets  ?  Results-Comments  CD34 and p53 stains show rare CD34+ blasts (<5%) and exceedingly rare p53+ cells. Controls stain appropriately.  ?  INTERERETATION: (600 cell diff)  ?  Bone Marrow Aspirate specimen is adequate and is markedly hypercellular. Spicules are present.  Megakaryocytes Are increaged in numberwith occasional small mono/hypolobated forms and rare small  separated lobe forms identified. My elopoiesis Is abnormal, increased, and proceeds to completion. Blasts and promonocytes comprise 6% of total nucleated cells. 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 The M/E ratio is 8.871.  Erythroid series Shows occasional megaloblastoid changes.  Lymphoid Lymphocytes comprise 2% of total nucleated cells,  Comments: Correlate with bone marrow biopsy and cytogenetic studies,  Tron Stain Results: +2/6 Iron in reticulum, no ringed formsidentified.  Control: positive.  IMPRESSION: DATESIGNED OUT: 11/01/2016 TIME: 10:10 CMML-1.  COMMENT: The marrow biopsy was not available at the time of finalizing the aspirate report. In the event of a marrow biopsy report with discrepant results an addendum will be provided by [facility].  ?  My blast count was 8% ( 300 cell differential) with 15% monocytes; trilineage dysplasia; increased numbers of megakaryocytes and 90-100% cellular particles.  Becauseofthe high WBC I did my counting very close to the particles to void any dilution effect from the peripheral blood.  1% blasts in PBS and 19% monocytes.  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 We are asked to consult regarding management of hyperleukocytosis.    Patient with symptomatic hyperleukocytosis. I recommended we start hydroxyurea 500mg  BID (50% dose reduction for CrCl<60)  Spoke with patients primary hematologist Dr. Marcellus Scott who is in agreement with hydrea. Would wish to avoid HMA given her age. Anticipate to see improvement in WBC within 24-48 hours. Suspect her dizziness and hypoxia are a result of elevated WBC, however I do not think she needs urgent leukopheresis give hypoxia is improving.  We will consider leukopheresis if symptoms do not improve   Continue IVF and allopurinol for elevated uric acid  Follow up flow cytometry results    05/12/19 continue hydrea 500mg  BID. Reviewed flow cytometry results with hemepath Dr. Reginia Forts - approximately 2% blasts, numerous neutrophils and monocytes. Continue allopurinol and TLS labs. 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 May increase dose of hydrea tomorrow if dose not continue to downtrend further     Thank you for this consultation. We will continue to follow the patient.    Lindsay Brennan, D.O.  Hematology/Oncology

## 2019-05-14 NOTE — Progress Notes
DAILY GERIATRIC PROGRESS NOTE    DATE OF SERVICE: 05/14/2019  HOSPITAL DAY: 4  CHIEF COMPLAINT: No chief complaint on file.    ID: [id number] with history of CKD 4, HTN, and CMML presenting with lightheadedness and SOB, found to have hyperleukocytosis (WBC >100k), for which she is admitted.    INTERVAL EVENTS:  - TLS labs stable/improving  --> uric acid 12.8 --> 8.0  - Cr back to baseline (1.79) after starting lisinopril 10mg   - DIC labs stable/improving  --> INR 1.6  - seen by heme/onc: continuing hydroxyurea 500mg  BID for hyperleukocytosis --> may consider increasing dose  - seen by renal: L pelviectasis seen on renal u/s likely 2/2 old (passed) kidney stone; start lisinopril 10mg  for HTN and proteinuria (UMA 377mg /g) and will do CT KUB if Cr >3.0, otherwise no recs  - TTE showing moderate AS and diastolic dysfunction  - WBC stable (150k --> 135k) on hydroxyurea     SUBJECTIVE:  - NAEO, sleep interrupted due to rumination/anxious thoughts  - feeling down this morning, wants to talk to chaplain  - wants to space out blood draws, if possible, due to hematoma  - on RA overnight  - Otherwise no new cardiac, respiratory, or GI symptoms.    MEDICATIONS:  Scheduled:  acetaminophen, 650 mg, Oral, Q6H  allopurinol, 50 mg, Oral, Daily  aspirin, 81 mg, Oral, Daily  ferrous sulfate, 325 mg, Oral, Every Other Day  gabapentin, 100 mg, Oral, QHS  heparin, 5,000 Units, Subcutaneous, Q12H  hydroxyurea, 500 mg, Oral, BID  levothyroxine, 50 mcg, Oral, Every Other Day  levothyroxine, 75 mcg, Oral, Every Other Day  lidocaine, 1 patch, Transdermal, Q24H  lisinopril, 10 mg, Oral, Daily  magnesium oxide, 400 mg, Oral, Daily  pantoprazole, 40 mg, Oral, Daily  rosuvastatin, 20 mg, Oral, QHS  PRN:  analgesic balm, bisacodyl, diclofenac Sodium, hydrALAZINE, lidocaine PF, magnesium hydroxide, melatonin, senna  Infusions:  ? lactated ringers 75 mL/hr (05/13/19 1541)       PHYSICAL EXAM:  Temp:  [36.3 ?C (97.3 ?F)-37.5 ?C (99.5 ?F)] 36.8 ?C (98.3 ?F) Heart Rate:  [84-98] 98  Resp:  [16-20] 18  BP: (162-171)/(61-78) 171/78  NBP Mean:  [97-109] 109  SpO2:  [93 %-97 %] 97 %  I/O: I/O last 2 completed shifts:  In: 2160 [P.O.:720; I.V.:1200; Other:240]  Out: 2550 [Urine:2550]   +600cc    General:   Well-appearing in NAD   Eyes:   PERRL, EOM grossly intact, sclerae and conjunctivae anicteric and non-injected bilaterally with no pallor or exudates.   Ears:   Hearing grossly intact   Nose/Mouth:  MMM without exudates, ulcers, or petechiae.   Neck:  Normal range of motion. No JVD   Lymph:   No anterior/posterior cervical or submandibular LAD.   Lungs:   No increased WOB, no dullness to percussion, good air movement bilaterally, less crackles at L lung base than on prior exams, otherwise CTAB with no RWR.   Chest/Heart:   RRR, normal S1 S2, crescendo-decrescendo 2/6 systolic murmur heard best at 2nd R intercostal space, otherwise no MGR.    Abdomen:   Non-distended. No tenderness or masses on palpation, no rebound or guarding. +Splenomegaly   GU:   Not indicated.   Skin/integument:  No rashes, lesions, or breakdown. Fingernails with normal contour, color, and lunulae.   Extremities:   No cyanosis, clubbing, or edema. TTP along L trapezius improved from prior exam. Preserved ROM of L shoulder. Negative empty can test.   Neuro:  Alert and interactive, cranial nerves II-XII grossly intact, ambulates with shuffling gait without assistance       CAM DELIRIUM TEST:  Feature One:   A. Acute Change in mental status from baseline? []  yes [x]  no   B. Is there fluctuation in the mental status? []  yes [x]  no    Feature Two:  A. Does that patient have difficulty focusing attention?  []  yes [x]  no   (days of week, serial 7s)    Feature Three:  A.Is there evidence of disorganized thinking?  []  yes [x]  no    Feature Four:  A. Does the patient have an altered level of consciousness (ie: vigilant, lethargic, stuporous, comatous?  []  yes [x]  no    The diagnosis of delirium by CAM requires the presence of features 1 and 2 and either 3 or 4      DATA:  I have reviewed the following information from the last 24 hours: allied health and treating physician notes, imaging, labs and microbiology data and cardiac studies and telemetry data:    Lab Results   Component Value Date    WBC 135.44 (H) 05/14/2019    HGB 9.1 (L) 05/14/2019    HCT 29.8 (L) 05/14/2019    PLT 222 05/14/2019     Lab Results   Component Value Date    NA 139 05/14/2019    K 4.3 05/14/2019    CL 105 05/14/2019    CO2 23 05/14/2019    BUN 43 (H) 05/14/2019    CREAT 1.79 (H) 05/14/2019    GLUCOSE 82 05/14/2019    CALCIUM 9.4 05/14/2019    MG 1.3 (L) 05/11/2019    PHOS 4.9 (H) 05/14/2019     Lab Results   Component Value Date    APTT 53.4 (H) 05/14/2019    PT 17.3 (H) 05/14/2019    INR 1.5 05/14/2019     Lab Results   Component Value Date    ALT 14 05/10/2019    AST 32 05/10/2019    BILITOT 0.6 05/10/2019    ALKPHOS 145 (H) 05/10/2019    ALBUMIN 3.5 (L) 05/10/2019     Lab Results   Component Value Date    TSH 5.5 (H) 05/10/2019     Lab Results   Component Value Date    CHOL 77 (L) 01/18/2019    CHOLHDL 19 (L) 01/18/2019    TRIGLY 165 (H) 01/18/2019       MICRO:  Covid-19 PCR negative, 05/10/19    IMAGING:     TTE, 05/13/19:    CONCLUSIONS   1. Technically difficult study.   2. Normal left ventricular size.   3. Mild concentric left ventricular hypertrophy.   4. Left ventricular ejection fraction is approximately 60 to 65%.   5. Abnormal LV diastolic function (Grade I).   6. Moderate aortic valve stenosis with an aortic valve area of 1.15 cm? (index 0.67 cm?/m?).   7. Mild mitral valve regurgitation.   8. The peak TR velocity is not well defined, and therefore, pulmonary artery systolic pressure cannot be calculated. Based on the acceleration time in the RV outflow tract, the PA pressure is likely to be elevated.   9. There are no prior studies on this patient for comparison purposes.    L Shoulder x-ray, 05/13/19:  IMPRESSION:  1. No bone lesions. 2. Mild glenohumeral osteoarthritis. Anterior acromial spur, mild acromiohumeral narrowing and spur formation on the greater tuberosity suggest chronic rotator cuff disease.  Renal ultrasound, 05/11/19:  IMPRESSION:  ?  Mild right hydronephrosis, unchanged since prior.  Left pelviectasis.    Abdominal ultrasound, 05/11/19:  IMPRESSION:  ?  1. Marked splenomegaly.  Mild hepatomegaly.  ?  2. Borderline right hydronephrosis.    CXR, 05/10/19:  IMPRESSION:  ?  Stable cardiomediastinal silhouette with mildly tortuous, calcified thoracic aorta. Persistent mild cardiomegaly.  Normal lung volumes. Increased density of the bilateral lower lung zones is accentuated due to overlying breast implants.  There are bilateral lower lobe patchy consolidations, most suggestive of pulmonary edema versus pneumonia.   No right pleural effusion. Trace left pleural effusion. No pneumothorax. Elevation of the left hemidiaphragm  No acute osseous abnormalities.  ?  Pertinent pathology:  Patlent Name: RASHAD, OBEID Accession#: [id number] Med. Rec. # [id number] Taken: 10/30/2016 Encounter #: [id number] Received: 10/30/2016 Gender: F Reported: 11/04/2016 16:53 DOB: 1934/03/03 (Age: 67)  Submitting Physician: Hubert Azure D M.D.  Additional Physician(s):  [facility]  Clinical Information:  85 year old with remota history of mild pancytopenia in 2008. There ara no recent history or CBC studies.  Specimen Received:  A: Bone marrow,aspirate for clot section, pic,cyto sent to iu B: Bone marrow, biopsy (decalelfied),Ipic  ?  Final  Pathologic Diagnosis:  id B. Bone marrow, bio and aspirate for clot section, Jeft posterioriliac crest: Myeloid neoplasm. See note.  As the senior physician, ~ attest that I: (i) examined the relevant preparation(s) for the specimen(s); and (ii) rendered or confirmed the diagnosis(es).  ?Electronically Signed ngz1/10/30/2016 Jobie Quaker, M.D. (Pathologist)  Deanne Coffer, M.D. (Resident) Signing Location: [facility], [address], [address], [place], [place] [address]  Note:  The majordifferential diagnosis includes chronic myelomonocytic leukemia. Clinical correlation and correlation with cytogenetics/FISH study including BCR-ABL7 fusion study is recommended for further classification.  Dr. 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  ?  Cellularity: Markedly hypercellular (> 90%) Myslopoiesis: Markedly increased with full maturation Erythropoiesis: Decreased Megakaryopoiesis: Markedly increased with frequent hypolobated/monolobated megakaryocytes including small hyperchromatic forms Additional studles: Reticulin stain shows mild increasein reticulin fibrosis (1+) with appropriate control,  Theimmunohistechemical stains are performedin this tase and same of the antigens may also ba evaluated by flow cytometry, Concument immunchistechemieal stains on tissue sections are Indicated in order to correlate immunophenotype with cell morphology, evaluate spatial dletribution/architectural features and/or for a quantitation of disease involvement  Bone marrow,aspirate smear:  Sample quality: Adequate  ME ratio: Markedly increased, ~ 8:1 Blasts: Not increased (4%) ' Myelopoiesis: Markedly increased with occasional hypolobated neutrophils Erythropoiesis: Decreased with megaloblastoid changes Megakaryopoiesis: Markedly increased with frequent hypolobated/monolobated megakaryocytes Comments: No clusters of blasts, no Auer rods. Additional studies: Iron stain performed at the [facility] showsincreased storage iron with no ring sideroblasts. Control stains appropriately,  Bone marrow differential:  Percent  Blasts 2  Promyeltocytes 7  Myelocytes 23  Metamyelocytes 6  Bands 16  Polys 30  Lymphocytes 3  Monocytes 2  Eosinophils 4  Basophils 1  Nucleated RBCs 10  Peripheral blood, smear:  CBC: WBC 46 k/cumm, hemoglobIn 10,7 g/dL, MCV 81 fL, ROW 59.5%, platelet count 316 k/cumm Differential: Neutrophils 57%, lymphocytes 5%, monocytes 21%, myelocytas 3%, Metamyelocytes 8%, band 6%  RBCs: Normocytic anaemia with anlsopgikilocytosis. WEBCs: Leucocytosis, granulocytosis with left-shift, monocytosis. Platelets: Normal platelet caunt with occasional giantplatelets  ?  Results-Comments  CD34 and p53 stains show rare CD34+ blasts (<5%) and exceedingly rare p53+ cells. Controls stain appropriately.  ?  INTERERETATION: (600 cell diff)  ?  Bone Marrow Aspirate specimen is adequate and is markedly hypercellular. Spicules are present.  Megakaryocytes Are increaged in numberwith occasional small mono/hypolobated forms and rare small separated lobe forms identified. My elopoiesis Is abnormal, increased, and proceeds to completion. Blasts and promonocytes comprise 6% of total nucleated cells. 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 The M/E ratio is 8.871.  Erythroid series Shows occasional megaloblastoid changes.  Lymphoid Lymphocytes comprise 2% of total nucleated cells,  Comments: Correlate with bone marrow biopsy and cytogenetic studies,  Tron Stain Results: +2/6 Iron in reticulum, no ringed formsidentified.  Control: positive.  IMPRESSION: DATESIGNED OUT: 11/01/2016 TIME: 10:10 CMML-1.  COMMENT: The marrow biopsy was not available at the time of finalizing the aspirate report. In the event of a marrow biopsy report with discrepant results an addendum will be provided by [facility].  ?  My blast count was 8% ( 300 cell differential) with 15% monocytes; trilineage dysplasia; increased numbers of megakaryocytes and 90-100% cellular particles.  Becauseofthe high WBC I did my counting very close to the particles to void any dilution effect from the peripheral blood.  1% blasts in PBS and 19% monocytes.  JMB    ASSESSMENT/PLAN:   Lindsay Brennan is a 85 y.o. 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  ?  #Symptomatic Hyperleukocytosis   #History of chronic myelomonocytic leukemia type-1  Patient's lightheadedness and SOB may be secondary to WBC >100k, though other than elevated Cr no e/o end-organ damage. Blasts <5% (4% on admission) making blast crisis less likely, though patient declining bone marrow bx to determine extent of blast cells in bone marrow. Patient has not received treatment previously, and notably CMML is not driven by BCR-ABL mutation. Evaluated by hematology/oncology who recommend hydroxyurea (dose-reduced for eGFR <60); if no improvement in WBC or worsening in symptoms will consider leukapheresis. Toxicities of hypo-methylating agents (treatment of choice in MDS/CMML) likely outweigh benefits in this 85yo patient, and regardless patient hopes to return to [place] for consideration of more aggressive treatment once she is stabilized. 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 Of note, no e/o of infection to explain marked increase in WBC from prior baseline (previously 45k in October 2020), though will have low threshold to start antibiotics, noting PCN allergy (hives). Elevated procalcitonin (1.12) difficult to interpret in setting of high circulating cytokines from heme malignancy.  []  heme/onc consulted, appreciate recs:  - hydroxyurea 500mg  (50% dose-reduced given eGFR <60) started 4/6 --> will consider increasing dose given persistently elevated WBC count (>100k) and improving GFR.  --> if no significant improvement in WBC by 4/8, will consider leukapheresis (or if symptoms worsen)  -Trend TLS labs (uric acid, phos, K, iCa) q8h --> will discuss with heme/onc if possible to space out to q12h per patient preference  -Trend DIC labs (INR, PTT, fibrinogen, D-dimer, platelets) q8h  --> maintain platelets >30lk  --> maintain fibrinogen >100  -Aggressive hydration (1.5cc/kg per hour --> 75cc/h in this patient)  --> IV Lasix 20mg  prn if worsening oxygenation or high positive fluid balance on strict I/Os  -Follow-up hematology malignancy panel  -Follow-up peripheral blood flow cytometry    #Acute hypoxic  respiratory failure:  #HFpEF, new diagnosis:  BNP elevated on admission (532) with TTE on 05/13/19 showing grade I diastolic dysfunction. Hypoxia on presentation likely 2/2 volume overload given improvement with IV Lasix.   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 Suspect hypervolemia due to renal insufficiency. BNP mildly elevated. No acid/base disorder noted on 4/5 VBG. UA without significant pyuria. L pelviectasis on renal ultrasound but per renal this is likely sequelae of prior nephrolithiasis. UMA 377mg /g.  []  renal consulted, appreciate recs:  - recommend restarting lisinopril 10mg  (4/8-) for long-term benefit in CKD  - trend Cr daily  - allopurinol 50mg     #Lightheadness, POA:  #Moderate Aortic Stenosis, new diagnosis:  Symptoms appears to be orthostatic in the setting of diltiazem, though orthostatics notably negative in ED. Patient reports improvement being off diltiazem. However, query component of deconditioning. TTE on 05/13/19 showing moderate AS, though unlikely to be cause of symptoms.  []  hold home diltiazem 180mg  and clonidine 0.1mg  TID  []  fall precautions --> junior walker ordered per PT recs  []  PT/OT following  []  repeat TTE April 2022 to assess for progression of AS  ?  #Elevated INR, POA, improving:  INR 1.9 on admission. This raises c/f synthetic liver dysfunction due to leukostasis or DIC, which can occur in up to 40% of patients with hyperleukocytosis. Per heme/onc, high INR likely due to factor X deficiency from factor X binding to atypical monocytes. Reassuringly, other DIC labs WNL (other than D-dimer), and other liver labs WNL (including albumin at 3.5). Consider nutritional deficiency given recent weigh loss, or consumptive coagulopathy given splenomegaly. INR now improving.  []  CTM, will consider vit K administration if increasing INR, after discussion with heme/onc  ?  #Anemia, POA: Hgb 8.9. Likely 2/2 renal disease and CMML. Iron studies not c/w iron deficiency anemia (ferritin 640, 28% transferrin saturation). Received Epo treatment as outpatient.  - trend CBC daily  - no indication for epo on this admission  - continue home iron supplementation (given CKD and on epo)  ?  #Hypertension:   Hold home diltiazem 180mg  and clonidine 0.1mg  TID given possibly contributing to lightheadedness and dizziness. Will spot dose with hydralazine and if BP persistently >160 will consider standing amlodipine.  []  continue lisinopril 10mg  as above --> up-titrate prn  - hydral 25mg  PO q6h prn for SBP >180 and/or DBP >110    #L shoulder pain, chronic but worsening:  X-ray suggestive of chronic rotator cuff disease, however exam also with TTP along L trapezius suggestive of myofascial pain syndrome.   []  [place] consulted, now s/p trigger point injection on 4/8 with improvement in symptoms  - diclofenac 1% gel prn  - lidocaine patch prn  - acetaminophen RTC    Chronic/Stable Medical Problems  #Subclinical hypothyrodism: TSH 5.2 on admission  -Continue levothyroxine , on alternating days   ?  #HLD:   -Continue rosuvastatin?20mg   ?  #Peripheral neuropathy:  -Continue gabapentin but dose reduce to 100mg  at bedtime (from 200mg ) given worsening kidney function and dizziness  ?  Inpatient Checklist  Diet: renal (low K, low phos)  GI ppx: home PPI  DVT ppx: heparin subq  Lines: pIV Tubes/Drains: none  ?  CODE STATUS: Full Code  Primary Emergency Contact: Margot Ables [phone])  ?  Disposition:  TBD (pending PT/OT eval)  ?  Ambulatory Status & Fall Risk  [] ? non-ambulatory  [] ? walks with cane and/or walker  [x] ? walks independently  [] ? walks independently but may benefit from an assistive device evaluation with physical therapy  [] ? needs supervision with walking secondary to poor safety awareness  ?  Discharge Considerations:  [x] ? recommend PT evaluation to see if appropriate for skilled nursing facility   [x] ? recommend increase level of care/supervision then patient currently has at home  [] ? recommend 24 hour supervision  [] ? recommend home health services  [] ? after stabilization patient may return to prior living situation as it is adequate to meet his/her needs  [] ? recommend new assistive device for ambulation, safety, and fall risk  [x] ? will re-evaluate later in the hospital course once further stabilized    This patient has an advanced directive:  Yes []   No [x]  Unknown []     This patient's designated decision maker if or when patient lacks capacity is: Daughter Kim  Code Status (designated by patient): Full Code    Discussed with attending, Dr. Andrey Cota., MD    Signed,  Henderson Cloud, MD/MBA  Internal Medicine, PGY-3  [phone]    Attending Addendum:  I have personally interviewed and examined the patient with Dr. Wendie Chess on the date of service. All labs, studies, and medications were independently reviewed by me. I have reviewed the note above and agree with the history, physical, assessment and plan which we formulated together. The patient/family/caregiver(s) were informed of the above plan, demonstrated understanding and was agreeable to the plan as written above.     Greater than 50% of today's 60 minute visit was spent counseling the patient and family regarding the above medical conditions, treatment options and management expectations and in coordination of care with staff.    Lindsay Neal L. Lennice Sites, MD  Geriatrics Attending

## 2019-05-14 NOTE — Nursing Note
0730: Received pt resting in bed AxO x4, calm and cooperative. On RA and tolerating well, WNL O2 saturations. Non tele. Call light within reach, bed alarm on, and pt safety measures in place at all times.    0900: OOB to chair with safety precautions in place. Meds whole with water.     1816: Pt requested another phlebotomist, they'll send another to draw 1800 labs.

## 2019-05-14 NOTE — Other
Patients Clinical Goal:   Clinical Goal(s) for the Shift: Pain Control, Comfort, Safety, VSS  Identify possible barriers to advancing the care plan: none  Stability of the patient: Moderately Unstable - medium risk of patient condition declining or worsening    End of Shift Summary: Pt a&ox4, calm, NAD, denies any pain. VS wnl for Pt, non-tele, O2sat 94% on room air. At start of the shift Pt ambulated steady with FWW and standby assist to BR, voided and had x1 soft BM. Now has Prima fit in place as per Pt request. Tolerated PO meds whole. IVF LR@75cc /hr. Fall precaution, bed alarm on, call light within reach. DME-Junior FWW at bedside.    BP 164/70  ~ Pulse 84  ~ Temp 36.3 ?C (97.3 ?F) (Temporal)  ~ Resp 16  ~ Ht 1.549 m (5' 1'')  ~ Wt 47.4 kg (104 lb 8 oz)  ~ SpO2 94%  ~ BMI 19.75 kg/m?         GERIATRICS END OF SHIFT - DISCHARGE MARKERS of Instability Checklist  Nursing assessment:     Indicator   Cutoff Check if indicator needs to be addressed (meets cutoff)   Situation/Action/Comment   O2 requirement New since admission []     PO intake Less than 50% []     Urination None in past 8 hours/last shift []     Foley New since admission []     Pain Present  [x]  Diclofenac for left shoulder pain   Bowel movements  <1 in 2 days or >3 in 24 hours []     Delirium Unable to follow simple commands or participate with PT/OT []     Mobility Unable to stand and/or walk to bathroom []  OOB to chair x2 times.  Ambulated 20 feet.       BOOST / SAFE TRANSITION - DISCHARGE INDICATORS    Indicator Check if indicator has red ''P''    Situation/Action/Comment     Problems with Medications  [x]     Psychological  []     Principal Diagnosis  []     Physical Limitations  []     Poor Health Literacy  []     Patient Support  []     Prior Hospitalizations  [x]     Palliative Care  []       DELIRIUM PREVENTION  Protocols Strategies Check if implemented Comments   Risk factors >3 present- High risk []     Purposeful orientation Reorient, purposeful orienting conversation  Familiar objects in room []   []     Therapeutic activities Volunteer visit  Cognitive stimulation activities: games, reading, music []   []     Vision & hearing Assistance with: Leisure centre manager  Assistance with: hearing aids/hearing amplifier []   []     Feeding & hydration Assistance with feeding  Assistance with dentures []   []     Sleep hygiene Shades/blinds/lights on during day, limit naps during day  Quiet environment, consolidate care []     Mobilization BMAT 3-4: Ambulate TID  BMAT 2: OOB daily to chair for meals ? 2 hours, each time  BMAT 1: OOB to cardiac chair daily for meals ? 2 hours, each time []   []   []     Pain Non-narcotics ATC  Non-pharmacological: oil/aromatherapy, massage, music []   []     Maintain safety Fall precautions, volunteer visit, family at bedside, tele sitter, constant observer []     Manage agitation Redirect with calm, gentle voice and avoid confrontation  Avoid restraints and use alternative to restraints  Leslie Dales, music or animal therapy, as Administrator, [employer]  for companionship if safe and appropriate []   []   []   []       DELIRIUM: CAM (+)  Protocols Strategies Check if implemented Comments   New-onset MD contacted  Delirium order-set initiated  Bladder scan to R/O retention  Assess stool impaction  Medication reviewed with pharmacist []   []   []   []   []  MD Name:   Existing Manage and prevent further delirium []

## 2019-05-14 NOTE — Progress Notes
[facility]PATIENT CONSULTATION    DATE OF SERVICE: 05/14/2019  ~  ADMISSION DATE: 05/10/2019    ~ HOSPITAL DAY: 4  PRINCIPLE PROBLEM: <principal problem not specified> ~ PMD: Salley Slaughter, MD    PRIMARY TEAM: Medicine - Geriatrics  REQUESTING PHYSICIAN(S): Andrey Cota., MD    CC/REASON FOR CONSULTATION: L neck pain    INTERVAL EVENTS AND REVIEW OF SYSTEMS:  L neck pain worse after trigger point injection - now with more soreness and bruising of the area. Otherwise no new cardiac, respiratory, or GI symptoms.    MEDICATIONS:  acetaminophen, 650 mg, Oral, Q6H  allopurinol, 50 mg, Oral, Daily  aspirin, 81 mg, Oral, Daily  ferrous sulfate, 325 mg, Oral, Every Other Day  gabapentin, 100 mg, Oral, QHS  heparin, 5,000 Units, Subcutaneous, Q12H  hydroxyurea, 500 mg, Oral, BID  levothyroxine, 50 mcg, Oral, Every Other Day  levothyroxine, 75 mcg, Oral, Every Other Day  lidocaine, 1 patch, Transdermal, Q24H  lisinopril, 10 mg, Oral, Daily  magnesium oxide, 400 mg, Oral, Daily  pantoprazole, 40 mg, Oral, Daily  rosuvastatin, 20 mg, Oral, QHS  PRNs: analgesic balm, bisacodyl, diclofenac Sodium, hydrALAZINE, lidocaine PF, magnesium hydroxide, melatonin, senna    VITALS:  Temp:  [36.3 ?C (97.3 ?F)-37.5 ?C (99.5 ?F)] 37.1 ?C (98.7 ?F)  Heart Rate:  [84-101] 101  Resp:  [16-20] 18  BP: (162-176)/(61-78) 176/72  NBP Mean:  [97-109] 107  SpO2:  [93 %-97 %] 97 %     Weight: 47.4 kg (104 lb 8 oz) Oxygen Therapy  SpO2: 97 %  O2 Device: None (Room air)  Flow Rate (L/min): 1 L/min     IN'S AND OUT'S:  I/O last 2 completed shifts:  In: 2160 [P.O.:720; I.V.:1200; Other:240]  Out: 2550 [Urine:2550]    PHYSICAL EXAM:  General: alert, well appearing, and in no distress.   Cardiovascular: pulse regular rate and rhythm. No peripheral edema.  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 now with post-injection soreness following trigger point injection.     The following problems are being addressed in this hospitalization:  Current Hospital Problems           POA    Hyperleukocytosis Yes          1. Post-injection soreness  2. L shoulder pain  3. L RTC disease    RECOMMENDATIONS:  Holding off further direct treatment to allow recovery from trigger point injection done yesterday    Diclofenac gel and analgesic balm ointment discussed. Can use ice as well    I'd expect the post-injection soreness to improve by tomorrow evening or Sunday morning. Will re-evaluate Monday if remains hospitalized.    The case history and the recommendations were discussed with the primary team.     AUTHOR:  Alric Quan, MD  05/14/2019 at 1:18 PM  [facility] Pager: [phone]

## 2019-05-15 DIAGNOSIS — I129 Hypertensive chronic kidney disease with stage 1 through stage 4 chronic kidney disease, or unspecified chronic kidney disease: Secondary | ICD-10-CM

## 2019-05-15 DIAGNOSIS — R801 Persistent proteinuria, unspecified: Secondary | ICD-10-CM

## 2019-05-15 DIAGNOSIS — E7800 Pure hypercholesterolemia, unspecified: Secondary | ICD-10-CM

## 2019-05-15 DIAGNOSIS — R42 Dizziness and giddiness: Secondary | ICD-10-CM

## 2019-05-15 LAB — Fibrinogen
FIBRINOGEN: 219 mg/dL — ABNORMAL LOW (ref 235–490)
FIBRINOGEN: 191 mg/dL — ABNORMAL LOW (ref 235–490)
FIBRINOGEN: 197 mg/dL — ABNORMAL LOW (ref 235–490)

## 2019-05-15 LAB — Uric Acid
URIC ACID: 7.8 mg/dL — ABNORMAL HIGH (ref 2.9–7.0)
URIC ACID: 7.7 mg/dL — ABNORMAL HIGH (ref 2.9–7.0)

## 2019-05-15 LAB — D-Dimer
D-DIMER STAGO: 2.43 ug{FEU}/mL — ABNORMAL HIGH (ref <0.60)
D-DIMER STAGO: 2.29 ug{FEU}/mL — ABNORMAL HIGH (ref <0.60)

## 2019-05-15 LAB — Basic Metabolic Panel
POTASSIUM: 4.1 mmol/L (ref 3.6–5.3)
UREA NITROGEN: 44 mg/dL — ABNORMAL HIGH (ref 7–22)
UREA NITROGEN: 45 mg/dL — ABNORMAL HIGH (ref 7–22)

## 2019-05-15 LAB — Differential, Manual: BLAST CELL: 1 — ABNORMAL HIGH (ref 0.2–0.8)

## 2019-05-15 LAB — Prothrombin Time Panel
INR: 1.5 s (ref 11.5–14.4)
INR: 1.5 s (ref 11.5–14.4)

## 2019-05-15 LAB — CBC
HEMATOCRIT: 25.4 — ABNORMAL LOW (ref 34.9–45.2)
MEAN CORPUSCULAR HEMOGLOBIN: 28.2 pg (ref 26.4–33.4)
MEAN CORPUSCULAR HEMOGLOBIN: 28.2 pg (ref 26.4–33.4)
NUCLEATED RBC%, AUTOMATED: 2.8 (ref 143–398)
NUCLEATED RBC%, AUTOMATED: 2.8 (ref 26.4–33.4)

## 2019-05-15 LAB — Phosphorus
PHOSPHORUS: 4.6 mg/dL — ABNORMAL HIGH (ref 2.3–4.4)
PHOSPHORUS: 5.3 mg/dL — ABNORMAL HIGH (ref 2.3–4.4)

## 2019-05-15 LAB — APTT
APTT: 54.7 s — ABNORMAL HIGH (ref 24.4–36.2)
APTT: 65.1 s — ABNORMAL HIGH (ref 24.4–36.2)

## 2019-05-15 LAB — Calcium,Ionized
IONIZED CA++,UNCORRECTED: 1.16 mmol/L (ref 1.09–1.29)
IONIZED CA++,UNCORRECTED: 1.13 mmol/L (ref 1.09–1.29)

## 2019-05-15 LAB — Hepatic Funct Panel: BILIRUBIN,CONJUGATED: <0.2 mg/dL (ref <=0.3)

## 2019-05-15 LAB — FISH: LAB AP FISH INTERPRETATION: 21 {titer}

## 2019-05-15 MED ORDER — HYDROXYUREA 500 MG PO CAPS
1000 mg | ORAL_CAPSULE | Freq: Two times a day (BID) | ORAL | 0 refills | Status: AC
Start: 2019-05-15 — End: 2019-05-18

## 2019-05-15 MED ADMIN — ACETAMINOPHEN 325 MG PO TABS: 650 mg | ORAL | @ 18:00:00 | Stop: 2019-05-15 | NDC 50580045811

## 2019-05-15 MED ADMIN — LACTATED RINGERS IV SOLN: 75 mL/h | INTRAVENOUS | @ 19:00:00 | Stop: 2019-05-17 | NDC 00338011704

## 2019-05-15 MED ADMIN — LIDOCAINE 5 % EX PTCH: 1 | TRANSDERMAL | @ 04:00:00 | Stop: 2019-05-19

## 2019-05-15 MED ADMIN — GABAPENTIN 100 MG PO CAPS: 100 mg | ORAL | @ 04:00:00 | Stop: 2019-05-19 | NDC 60687058001

## 2019-05-15 MED ADMIN — LIDOCAINE 5 % EX PTCH: 1 | TRANSDERMAL | @ 05:00:00 | Stop: 2019-05-19

## 2019-05-15 MED ADMIN — LISINOPRIL 20 MG PO TABS: 20 mg | ORAL | @ 18:00:00 | Stop: 2019-05-16 | NDC 00904679961

## 2019-05-15 MED ADMIN — GRX ANALGESIC BALM EX OINT: TOPICAL | @ 17:00:00 | Stop: 2019-05-19

## 2019-05-15 MED ADMIN — MELATONIN 3 MG PO TABS: 3 mg | ORAL | @ 04:00:00 | Stop: 2019-05-19

## 2019-05-15 MED ADMIN — ACETAMINOPHEN 325 MG PO TABS: 650 mg | ORAL | @ 12:00:00 | Stop: 2019-05-15 | NDC 50580045811

## 2019-05-15 MED ADMIN — LIDOCAINE 5 % EX PTCH: 1 | TRANSDERMAL | @ 18:00:00 | Stop: 2019-05-19 | NDC 00591352530

## 2019-05-15 MED ADMIN — GRX ANALGESIC BALM EX OINT: TOPICAL | @ 04:00:00 | Stop: 2019-05-19

## 2019-05-15 MED ADMIN — HYDROXYUREA 500 MG PO CAPS: 1000 mg | ORAL | @ 01:00:00 | Stop: 2019-05-17 | NDC 00904693961

## 2019-05-15 MED ADMIN — PANTOPRAZOLE SODIUM 40 MG PO TBEC: 40 mg | ORAL | @ 18:00:00 | Stop: 2019-05-19 | NDC 50268063915

## 2019-05-15 MED ADMIN — HYDROXYUREA 500 MG PO CAPS: 1000 mg | ORAL | @ 13:00:00 | Stop: 2019-05-17 | NDC 00904693961

## 2019-05-15 MED ADMIN — FERROUS SULFATE 325 (65 FE) MG PO TBEC: 325 mg | ORAL | @ 18:00:00 | Stop: 2019-05-19 | NDC 00245010801

## 2019-05-15 MED ADMIN — LEVOTHYROXINE SODIUM 75 MCG PO TABS: 75 ug | ORAL | @ 12:00:00 | Stop: 2019-06-10 | NDC 51079044120

## 2019-05-15 MED ADMIN — ALLOPURINOL 50 MG PO TABS: 50 mg | ORAL | @ 18:00:00 | Stop: 2019-06-12 | NDC 00603211521

## 2019-05-15 MED ADMIN — ACETAMINOPHEN 325 MG PO TABS: 650 mg | ORAL | @ 01:00:00 | Stop: 2019-05-15 | NDC 50580045811

## 2019-05-15 MED ADMIN — ROSUVASTATIN CALCIUM 10 MG PO TABS: 20 mg | ORAL | @ 04:00:00 | Stop: 2019-06-10 | NDC 60687024501

## 2019-05-15 MED ADMIN — GUAIFENESIN 100 MG/5ML PO SOLN: 100 mg | ORAL | @ 07:00:00 | Stop: 2019-06-14 | NDC 00121148800

## 2019-05-15 MED ADMIN — ASPIRIN 81 MG PO CHEW: 81 mg | ORAL | @ 18:00:00 | Stop: 2019-05-19 | NDC 66553000201

## 2019-05-15 MED ADMIN — LACTATED RINGERS IV SOLN: 75 mL/h | INTRAVENOUS | @ 02:00:00 | Stop: 2019-05-17 | NDC 00338011704

## 2019-05-15 MED ADMIN — MAGNESIUM OXIDE 400 (240-241.3 MG) MG (MULTI-GPI) PO TABS: 400 mg | ORAL | @ 18:00:00 | Stop: 2019-06-09 | NDC 64980033901

## 2019-05-15 MED ADMIN — HEPARIN SODIUM (PORCINE) 5000 UNIT/ML IJ SOLN: 5000 [IU] | SUBCUTANEOUS | @ 18:00:00 | Stop: 2019-05-19 | NDC 00641040012

## 2019-05-15 MED ADMIN — HEPARIN SODIUM (PORCINE) 5000 UNIT/ML IJ SOLN: 5000 [IU] | SUBCUTANEOUS | @ 04:00:00 | Stop: 2019-05-19 | NDC 00641040012

## 2019-05-15 MED ADMIN — ROSUVASTATIN CALCIUM 20 MG PO TABS: 20 mg | ORAL | @ 04:00:00 | Stop: 2019-05-19 | NDC 60687024501

## 2019-05-15 MED ADMIN — ACETAMINOPHEN 325 MG PO TABS: 650 mg | ORAL | @ 07:00:00 | Stop: 2019-05-15 | NDC 50580045811

## 2019-05-15 NOTE — Other
Patients Clinical Goal:   Clinical Goal(s) for the Shift: VSS, comfort, pain management, safety, rest, sleep  Identify possible barriers to advancing the care plan:   Stability of the patient: Moderately Unstable - medium risk of patient condition declining or worsening    End of Shift Summary: Patient AXOX4, on room air, no SOB. IVF ongoing @ 75 cc/hr. Patient took all due meds. Applied analgesic balm on the left shoulder for pain management, also on tylenol ATC. Medicated with guaifenesin for cough. Slept through the night. Comfort and safety maintained, call light within reach.   GERIATRICS END OF SHIFT - DISCHARGE MARKERS of Instability Checklist  Nursing assessment:     Indicator   Cutoff Check if indicator needs to be addressed (meets cutoff)   Situation/Action/Comment   O2 requirement New since admission []     PO intake Less than 50% []     Urination None in past 8 hours/last shift []     Foley New since admission []     Pain Present  [x]  Left shoulder-tylenol ATC, analgesic balm   Bowel movements  <1 in 2 days or >3 in 24 hours []     Delirium Unable to follow simple commands or participate with PT/OT []     Mobility Unable to stand and/or walk to bathroom []  OOB to chair 3 times.  Ambulated on the unit.       BOOST / SAFE TRANSITION - DISCHARGE INDICATORS    Indicator Check if indicator has red ''P''    Situation/Action/Comment     Problems with Medications  []     Psychological  []     Principal Diagnosis  []     Physical Limitations  []     Poor Health Literacy  []     Patient Support  []     Prior Hospitalizations  [x]     Palliative Care  []       DELIRIUM PREVENTION  Protocols Strategies Check if implemented Comments   Risk factors >3 present- High risk []     Purposeful orientation Reorient, purposeful orienting conversation  Familiar objects in room []   []     Therapeutic activities Volunteer visit  Cognitive stimulation activities: games, reading, music []   []     Vision & hearing Assistance with: Leisure centre manager  Assistance with: hearing aids/hearing amplifier []   []     Feeding & hydration Assistance with feeding  Assistance with dentures []   []     Sleep hygiene Shades/blinds/lights on during day, limit naps during day  Quiet environment, consolidate care []     Mobilization BMAT 3-4: Ambulate TID  BMAT 2: OOB daily to chair for meals ? 2 hours, each time  BMAT 1: OOB to cardiac chair daily for meals ? 2 hours, each time []   []   []     Pain Non-narcotics ATC  Non-pharmacological: oil/aromatherapy, massage, music []   []     Maintain safety Fall precautions, volunteer visit, family at bedside, tele sitter, constant observer []     Manage agitation Redirect with calm, gentle voice and avoid confrontation  Avoid restraints and use alternative to restraints  Doll, music or animal therapy, as appropriate  Volunteer for companionship if safe and appropriate []   []   []   []       DELIRIUM: CAM (+)  Protocols Strategies Check if implemented Comments   New-onset MD contacted  Delirium order-set initiated  Bladder scan to R/O retention  Assess stool impaction  Medication reviewed with pharmacist []   []   []   []   []  MD Name:   Existing Manage and  prevent further delirium []

## 2019-05-15 NOTE — Nursing Note
1940- Received the patient AXOX4, on room air, no SOB. Is resting in chair, is comfortable. Comfort and safety maintained, call light within reach. Will monitor.    2114- Patient took all due meds. Applied analgesic balm on the left shoulder for pain management. Comfort and safety maintained, call light within reach.    Lorraine.[email or web address]- Paged MD as patient has cough. Asking cough medication.    4540- Medicated with guaifenesin for cough.    0200-Asleep, in no distress, will monitor.

## 2019-05-15 NOTE — Other
Patients Clinical Goal:   Clinical Goal(s) for the Shift: VS WNL, comfort/safety, pain control  Identify possible barriers to advancing the care plan: Treatment  Stability of the patient: Moderately Unstable - medium risk of patient condition declining or worsening    End of Shift Summary: Pt is AxO x4, calm and pleasant. C/o left shoulder pain; tylenol, analgesic balm, lidocaine patch, and heat packs applied for pain control. On RA, continuous pulse ox only. Non tele. O2 WNL, no SOB. Worked with PT today, walked around the unit. OOB to chair, able to feed self. Takes meds whole. UA sent. SBP elevated, MD Madelyn Flavors is aware and no new interventions. Dtr updated at bedside. LR running continuously at 75 cc/hr. Pending discharge upon medical stability. Call light within reach, bed alarm on, and pt safety measures in place at all times.

## 2019-05-15 NOTE — Consults
Inpatient Hematology/Oncology Consultation    Patient name:  Lindsay Brennan  MRN:  [id number]  DOB:  08-May-1934  Location: 5228/1  Date of service:  05/15/2019    Reason for consultation: CMML  Primary service: Geriatrics   Referring provider: Andrey Cota., MD  PCP: Salley Slaughter, MD  Outpatient hematologist/oncologist (if any): Dr. Osvaldo Human of [place] [phone]    History of Presenting Illness:  Lindsay Brennan is a 85 y.o. 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 Per the patient, lightheadedness has time correlation with diltiazem, improving with decrease in the dosage, and only occurring when she rises from seated position. She was planning to go back home to [place] but mentioned dizziness to her family who reportedly found her O2 saturation to be 80's at home, prompting admission. She says she only has dyspnea when lying flat, but improves in supine. She also endorses cough for the past 2 days. COVID negative.   ?  [facility], evaluated by oncology service, started on IVF and allopurinol for uric acid 11.8. other notable labs include LDH 1426, d-dimer 2.4, fibrinogen 271, PT 20.4    CXR demonstrated bilateral lower lobe consolidations  abd [place] with marked splenomegaly  ?  05/11/19 transferred to [facility] to geriatrics service.   Patient reports weight loss, occasional night sweats, early satiety. Continues to have dizziness and SOB with exertion.     05/12/19 started hydrea 500mg  BID yesterday. Breathing and dizziness is stable    05/13/19 no overnight events WBC down to 115    05/14/19 dizziness has resolved, WBC stable. Still with DOE. No fevers, dry cough which is unchanged.     05/15/19:  Denies dizziness.  Denies sob.  Still with some doe.  Denies fevers.  WBC 91, Hgb 7.7.      ROS:  A complete 14 point review of systems has been is negative except for as documented.    Past Medical History:  Past Medical History:   Diagnosis Date   ? CKD (chronic kidney disease)    ? GERD (gastroesophageal reflux disease)    ? History of CVA (cerebrovascular accident) 2011   ? 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 Hyperlipidemia    ? Hypertension    ? Hypothyroidism    ? Leukemia (HCC/RAF)    ? Myelodysplasia (myelodysplastic syndrome) (HCC/RAF)        Past Surgical History:  Past Surgical History:   Procedure Laterality Date   ? CESAREAN SECTION     ? MASTECTOMY Bilateral    ? TOTAL ABDOMINAL HYSTERECTOMY         Social History:  Social History     Tobacco Use   ? Smoking status: Never Smoker   ? Smokeless tobacco: Never Used   Substance Use Topics   ? Alcohol use: Not Currently     Family History:  Family History   Problem Relation Age of Onset   ? Stroke Mother    ? Emphysema Father    ? Ovarian cancer Sister    ? Heart attack Brother    ? Lung cancer Sister    ? Lung cancer Sister    ? Colon cancer Brother    ? Colon cancer Son        Allergies:  Allergies   Allergen Reactions   ? Oxycodone-Acetaminophen Other (See Comments)   ? Penicillins Hives   ? Sulfa Antibiotics Agranulocytosis   ? Naproxen Rash     Aleve       Outpatient Medications:  Facility-Administered Medications Prior to Admission   Medication Dose Route Frequency Provider Last Rate Last Admin   ? [DISCONTINUED] epoetin alfa-epbx (Retacrit) 10,000 units/mL inj 20,000 Units  20,000 Units Subcutaneous Once Boonpheng, Boonph[place]hop, MD         Medications Prior to Admission   Medication Sig Dispense Refill Last Dose   ? epoetin alfa-epbx (RETACRIT) 10,000 units/mL injection Inject 20,000 Units under the skin Every month.      ? esomeprazole 40 mg capsule Take 40 mg by mouth daily .   05/09/2019 at Unknown time   ? Ferric Citrate 1 GM 210 MG(Fe) TABS Take 1 tablet by mouth three (3) times daily with meals. 270 tablet 1    ? rosuvastatin 20 mg tablet Take 20 mg by mouth at bedtime .   05/10/2019 at Unknown time   ? sodium bicarbonate 650 mg tablet Take 1 tablet (650 mg total) by mouth two (2) times daily. 60 tablet 2 05/09/2019 at Unknown time   ? SYNTHROID 50 mcg tablet Take 50 mcg by mouth every other day Pt alternates with 75mg .      ? SYNTHROID 75 mcg tablet Take 75 mcg by mouth every other day Pt alternates with 50mg .   05/10/2019 at Unknown time   ? [DISCONTINUED] aspirin 81 mg EC tablet Take 81 mg by mouth daily.   05/09/2019 at Unknown time   ? [DISCONTINUED] cloNIDine 0.1 mg tablet Take 0.1 mg by mouth two (2) times daily.      ? [DISCONTINUED] dilTIAZem (CARDIZEM CD) 180 mg 24 hr capsule Take 180 mg by mouth daily.      ? [DISCONTINUED] gabapentin 100 mg capsule Take 200 mg by mouth at bedtime .   05/09/2019 at Unknown time   ? polyethylene glycol powder packet Take 1 packet (17 g total) by mouth daily.      ? [DISCONTINUED] allopurinol 100 mg tablet Take 2 tablets (200 mg total) by mouth three (3) times daily. (Patient not taking: Reported on 05/11/2019.)   Not Taking at Unknown time   ? [DISCONTINUED] bisacodyl 5 mg EC tablet Take 1 tablet (5 mg total) by mouth daily as needed for Constipation. 100 tablet 0    ? [DISCONTINUED] heparin 5000 unit/mL injection Inject 1 mL (5,000 Units total) under the skin every twelve (12) hours.      ? [DISCONTINUED] hydrALAZINE 10 mg tablet Take 1 tablet (10 mg total) by mouth every six (6) hours.      ? [DISCONTINUED] lactated ringers IV soln Inject 50 mL/hr into the vein continuous. (Patient not taking: Reported on 05/11/2019.)   Not Taking at Unknown time   ? [DISCONTINUED] pantoprazole 40 mg DR tablet Take 1 tablet (40 mg total) by mouth daily.      ? [DISCONTINUED] senna 8.6 mg tablet Take 1 tablet by mouth at bedtime as needed for Constipation. (Patient not taking: Reported on 05/11/2019.)   Not Taking at Unknown time     Inpatient Medications:  Scheduled Meds:  ? acetaminophen  650 mg Oral Q6H   ? allopurinol  50 mg Oral Daily   ? aspirin  81 mg Oral Daily   ? [START ON 05/17/2019] epoetin alfa-epbx  150 Units/kg Subcutaneous Once per day on Mon  Wed Fri   ? ferrous sulfate  325 mg Oral Every Other Day   ? gabapentin  100 mg Oral QHS   ? heparin  5,000 Units Subcutaneous Q12H   ? hydroxyurea  1,000 mg Oral BID   ? levothyroxine  50 mcg Oral Every Other Day   ? levothyroxine  75 mcg Oral Every Other Day   ? lidocaine  1 patch Transdermal Q24H   ? lisinopril  20 mg Oral Daily   ? magnesium oxide  400 mg Oral Daily   ? pantoprazole  40 mg Oral Daily   ? rosuvastatin  20 mg Oral QHS     Continuous Infusions:  ? lactated ringers 75 mL/hr (05/15/19 1152)     PRN Meds:.analgesic balm, bisacodyl, diclofenac Sodium, guaiFENesin, hydrALAZINE, lidocaine PF, magnesium hydroxide, melatonin, senna    Vital signs:  Vitals:    05/14/19 2112 05/15/19 0521 05/15/19 0947 05/15/19 1208   BP: 164/69 166/67 164/74 155/72   Patient Position:       Pulse: 95 91 97 92   Resp: 16 18 18 17    Temp: 36.4 ?C (97.5 ?F) 36.4 ?C (97.6 ?F) 37.1 ?C (98.7 ?F) 36.2 ?C (97.2 ?F)   TempSrc: Temporal Temporal Temporal Temporal   SpO2: 95% 94% 96% 95%   Weight:  48.8 kg (107 lb 8 oz)     Height:         Vital sign ranges (24h):  Temp:  [36.2 ?C (97.2 ?F)-37.1 ?C (98.7 ?F)] 36.2 ?C (97.2 ?F)  Heart Rate:  [90-97] 92  Resp:  [16-18] 17  BP: (155-176)/(67-74) 155/72  NBP Mean:  [100-106] 100  SpO2:  [94 %-96 %] 95 %    Intake/Output (24h):    Intake/Output Summary (Last 24 hours) at 05/15/2019 1525  Last data filed at 05/15/2019 1400  Gross per 24 hour   Intake 3210 ml   Output 1950 ml   Net 1260 ml       Physical Exam:  General: Appears well-developed, well-nourished and close to stated age.   CV: Regular in rate and rhythm  Chest: Clear to auscultation bilaterally without wheezing or rhonchi.  Respiratory effort appears normal.   Abdomen: Soft, nontender and nondistended.  +splenomegaly  Musculoskeletal: No edema. No cyanosis. Extremities are warm and well-perfused.   Psychiatric: Affect appropriate.  Pleasant and conversant.     Labs:    Lab Results   Component Value Date    ALT 16 05/14/2019    AST 37 05/14/2019    ALKPHOS 202 (H) 05/14/2019    BILITOT 0.4 05/14/2019     Lab Results   Component Value Date    CREAT 1.84 (H) 05/15/2019    BUN 45 (H) 05/15/2019    NA 137 05/15/2019    K 4.2 05/15/2019    CL 105 05/15/2019    CO2 22 05/15/2019     Lab Results   Component Value Date    WBC 91.32 (H) 05/15/2019    HGB 7.7 (L) 05/15/2019    HCT 25.4 (L) 05/15/2019    MCV 93.0 05/15/2019    PLT 159 05/15/2019    NEUTPCT 52 01/18/2019    NEUTABS 32.2 (H) 01/18/2019    MONOPCT 14 01/18/2019    MONOABS 8.5 (H) 01/18/2019     Coags:  Recent Labs     05/15/19  1150 05/15/19  0700 05/14/19  1904 05/14/19  0911   PT  --  17.1* 17.1* 17.3*   INR  --  1.5 1.5 1.5   APTT  --  65.1* 54.7* 53.4*   FIBRINOGEN 197* 191* 219* 226*   DDIMER  --  2.29* 2.43* 2.52*     Flow cytometry  INTERPRETATION:  Small population of myeloblasts (1% of total events)   ?  COMMENT:  Marked leukocytosis with neutrophilia, monocytosis and rare blasts. Please correlate with FISH studies.    Pertinent imaging:  05/10/19 abd US  IMPRESSION:  1. Marked splenomegaly.  Mild hepatomegaly.  2. Borderline right hydronephrosis.  ?  05/09/19 CXR  IMPRESSION:  ?  Stable cardiomediastinal silhouette with mildly tortuous, calcified thoracic aorta. Persistent mild cardiomegaly.  Normal lung volumes. Increased density of the bilateral lower lung zones is accentuated due to overlying breast implants.  There are bilateral lower lobe patchy consolidations, most suggestive of pulmonary edema versus pneumonia.   No right pleural effusion. Trace left pleural effusion. No pneumothorax. Elevation of the left hemidiaphragm  No acute osseous abnormalities.  ?    Pertinent pathology:  Patlent Name: Lindsay, Brennan Accession#: [id number] Med. Rec. # [id number] Taken: 10/30/2016 Encounter #: [id number] Received: 10/30/2016 Gender: F Reported: 11/04/2016 16:53 DOB: [birth year] (Age: 89)  Submitting Physician: Hubert Azure D M.D.  Additional Physician(s):  Location: [facility]  Clinical Information:  85 year old with remota history of mild pancytopenia in 2008. There ara no recent history or CBC studies.  Specimen Received:  A: Bone marrow,aspirate for clot section, pic,cyto sent to iu B: Bone marrow, biopsy (decalelfied),Ipic  ?  Final  Pathologic Diagnosis:  id B. Bone marrow, bio and aspirate for clot section, Jeft posterioriliac crest: Myeloid neoplasm. See note.  As the senior physician, ~ attest that I: (i) examined the relevant preparation(s) for the specimen(s); and (ii) rendered or confirmed the diagnosis(es).  ?Electronically Signed ngz1/10/30/2016 Jobie Quaker, M.D. (Pathologist)  Deanne Coffer, M.D. 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 Clinical correlation and correlation with cytogenetics/FISH study including BCR-ABL7 fusion study is recommended for further classification.  Dr. 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  ?  Cellularity: Markedly hypercellular (> 90%) Myslopoiesis: Markedly increased with full maturation Erythropoiesis: Decreased Megakaryopoiesis: Markedly increased with frequent hypolobated/monolobated megakaryocytes including small hyperchromatic forms Additional studles: Reticulin stain shows mild increasein reticulin fibrosis (1+) with appropriate control,  Theimmunohistechemical stains are performedin this tase and same of the antigens may also ba evaluated by flow cytometry, Concument immunchistechemieal stains on tissue sections are Indicated in order to correlate immunophenotype with cell morphology, evaluate spatial dletribution/architectural features and/or for a quantitation of disease involvement  Bone marrow,aspirate smear:  Sample quality: Adequate  ME ratio: Markedly increased, ~ 8:1 Blasts: Not increased (4%) ' Myelopoiesis: Markedly increased with occasional hypolobated neutrophils Erythropoiesis: Decreased with megaloblastoid changes Megakaryopoiesis: Markedly increased with frequent hypolobated/monolobated megakaryocytes Comments: No clusters of blasts, no Auer rods. Additional studies: Iron stain performed at the [facility] showsincreased storage iron with no ring sideroblasts. Control stains appropriately,  Bone marrow differential:  Percent  Blasts 2  Promyeltocytes 7  Myelocytes 23  Metamyelocytes 6  Bands 16  Polys 30  Lymphocytes 3  Monocytes 2  Eosinophils 4  Basophils 1  Nucleated RBCs 10  Peripheral blood, smear:  CBC: WBC 46 k/cumm, hemoglobIn 10,7 g/dL, MCV 81 fL, ROW 10.2%, platelet count 316 k/cumm Differential: Neutrophils 57%, lymphocytes 5%, monocytes 21%, myelocytas 3%, Metamyelocytes 8%, band 6%  RBCs: Normocytic anaemia with anlsopgikilocytosis. WEBCs: Leucocytosis, granulocytosis with left-shift, monocytosis. Platelets: Normal platelet caunt with occasional giantplatelets  ?  Results-Comments  CD34 and p53 stains show rare CD34+ blasts (<5%) and exceedingly rare p53+ cells. Controls stain appropriately.  ?  INTERERETATION: (600 cell diff)  ?  Bone Marrow Aspirate specimen is adequate and is markedly hypercellular. Spicules are present.  Megakaryocytes Are increaged in numberwith occasional small mono/hypolobated forms and rare small separated lobe forms identified. My elopoiesis Is abnormal, increased, and proceeds to completion. Blasts and promonocytes comprise 6% of total nucleated cells. 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 The M/E ratio is 8.871.  Erythroid series Shows occasional megaloblastoid changes.  Lymphoid Lymphocytes comprise 2% of total nucleated cells,  Comments: Correlate with bone marrow biopsy and cytogenetic studies,  Tron Stain Results: +2/6 Iron in reticulum, no ringed formsidentified.  Control: positive.  IMPRESSION: DATESIGNED OUT: 11/01/2016 TIME: 10:10 CMML-1.  COMMENT: The marrow biopsy was not available at the time of finalizing the aspirate report. In the event of a marrow biopsy report with discrepant results an addendum will be provided by [facility].  ?  My blast count was 8% ( 300 cell differential) with 15% monocytes; trilineage dysplasia; increased numbers of megakaryocytes and 90-100% cellular particles.  Becauseofthe high WBC I did my counting very close to the particles to void any dilution effect from the peripheral blood.  1% blasts in PBS and 19% monocytes.  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 We are asked to consult regarding management of hyperleukocytosis.    Patient with symptomatic hyperleukocytosis. I recommended we start hydroxyurea 500mg  BID (50% dose reduction for CrCl<60)  Spoke with patients primary hematologist Dr. Marcellus Scott who is in agreement with hydrea. Would wish to avoid HMA given her age. Anticipate to see improvement in WBC within 24-48 hours. Suspect her dizziness and hypoxia are a result of elevated WBC, however I do not think she needs urgent leukopheresis give hypoxia is improving.  We will consider leukopheresis if symptoms do not improve   Continue IVF and allopurinol for elevated uric acid  Follow up flow cytometry results    05/12/19 continue hydrea 500mg  BID. Reviewed flow cytometry results with hemepath Dr. Reginia Forts - approximately 2% blasts, numerous neutrophils and monocytes. Continue allopurinol and TLS labs. 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 May increase dose of hydrea tomorrow if dose not continue to downtrend further     05/14/19 increase hydrea to 1000mg  BID. Monitor hemoglobin and platelets closely. start epo is h/h drops <8    05/15/19: continue hydrea 1000mg  BID.  Start epo for Hgb <8.  Monitor CBC closely       Thank you for this consultation. We will continue to follow the patient.    Kandis Fantasia

## 2019-05-15 NOTE — Consults
Inpatient Hematology/Oncology Consultation    Patient name:  Lindsay Brennan  MRN:  [id number]  DOB:  [birth year]  Location: 5228/1  Date of service:  05/14/2019    Reason for consultation: CMML  Primary service: Geriatrics   Referring provider: Iran Planas, MD  PCP: Salley Slaughter, MD  Outpatient hematologist/oncologist (if any): Dr. Osvaldo Human of [place] [phone]    History of Presenting Illness:  Lindsay Brennan is a 85 y.o. 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 Per the patient, lightheadedness has time correlation with diltiazem, improving with decrease in the dosage, and only occurring when she rises from seated position. She was planning to go back home to [place] but mentioned dizziness to her family who reportedly found her O2 saturation to be 80's at home, prompting admission. She says she only has dyspnea when lying flat, but improves in supine. She also endorses cough for the past 2 days. COVID negative.   ?  [facility], evaluated by oncology service, started on IVF and allopurinol for uric acid 11.8. other notable labs include LDH 1426, d-dimer 2.4, fibrinogen 271, PT 20.4    CXR demonstrated bilateral lower lobe consolidations  abd [place] with marked splenomegaly  ?  05/11/19 transferred to [facility] to geriatrics service.   Patient reports weight loss, occasional night sweats, early satiety. Continues to have dizziness and SOB with exertion.     05/12/19 started hydrea 500mg  BID yesterday. Breathing and dizziness is stable    05/13/19 no overnight events WBC down to 115    05/14/19 dizziness has resolved, WBC stable. Still with DOE. No fevers, dry cough which is unchanged.     ROS:  A complete 14 point review of systems has been is negative except for as documented.    Past Medical History:  Past Medical History:   Diagnosis Date ? CKD (chronic kidney disease)    ? GERD (gastroesophageal reflux disease)    ? History of CVA (cerebrovascular accident) 2011   ? 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 Hyperlipidemia    ? Hypertension    ? Hypothyroidism    ? Leukemia (HCC/RAF)    ? Myelodysplasia (myelodysplastic syndrome) (HCC/RAF)        Past Surgical History:  Past Surgical History:   Procedure Laterality Date   ? CESAREAN SECTION     ? MASTECTOMY Bilateral    ? TOTAL ABDOMINAL HYSTERECTOMY         Social History:  Social History     Tobacco Use   ? Smoking status: Never Smoker   ? Smokeless tobacco: Never Used   Substance Use Topics   ? Alcohol use: Not Currently     Family History:  Family History   Problem Relation Age of Onset   ? Stroke Mother    ? Emphysema Father    ? Ovarian cancer Sister    ? Heart attack Brother    ? Lung cancer Sister    ? Lung cancer Sister    ? Colon cancer Brother    ? Colon cancer Son        Allergies:  Allergies   Allergen Reactions   ? Oxycodone-Acetaminophen Other (See Comments)   ? Penicillins Hives   ? Sulfa Antibiotics Agranulocytosis   ?  Naproxen Rash     Aleve       Outpatient Medications:  Facility-Administered Medications Prior to Admission   Medication Dose Route Frequency Provider Last Rate Last Admin   ? [DISCONTINUED] epoetin alfa-epbx (Retacrit) 10,000 units/mL inj 20,000 Units  20,000 Units Subcutaneous Once Boonpheng, Boonph[place]hop, MD         Medications Prior to Admission   Medication Sig Dispense Refill Last Dose   ? epoetin alfa-epbx (RETACRIT) 10,000 units/mL injection Inject 20,000 Units under the skin Every month.      ? esomeprazole 40 mg capsule Take 40 mg by mouth daily .   05/09/2019 at Unknown time   ? Ferric Citrate 1 GM 210 MG(Fe) TABS Take 1 tablet by mouth three (3) times daily with meals. 270 tablet 1    ? rosuvastatin 20 mg tablet Take 20 mg by mouth at bedtime .   05/10/2019 at Unknown time   ? sodium bicarbonate 650 mg tablet Take 1 tablet (650 mg total) by mouth two (2) times daily. 60 tablet 2 05/09/2019 at Unknown time   ? SYNTHROID 50 mcg tablet Take 50 mcg by mouth every other day Pt alternates with 75mg .      ? SYNTHROID 75 mcg tablet Take 75 mcg by mouth every other day Pt alternates with 50mg .   05/10/2019 at Unknown time   ? [DISCONTINUED] aspirin 81 mg EC tablet Take 81 mg by mouth daily.   05/09/2019 at Unknown time   ? [DISCONTINUED] cloNIDine 0.1 mg tablet Take 0.1 mg by mouth two (2) times daily.      ? [DISCONTINUED] dilTIAZem (CARDIZEM CD) 180 mg 24 hr capsule Take 180 mg by mouth daily.      ? [DISCONTINUED] gabapentin 100 mg capsule Take 200 mg by mouth at bedtime .   05/09/2019 at Unknown time   ? polyethylene glycol powder packet Take 1 packet (17 g total) by mouth daily.      ? [DISCONTINUED] allopurinol 100 mg tablet Take 2 tablets (200 mg total) by mouth three (3) times daily. (Patient not taking: Reported on 05/11/2019.)   Not Taking at Unknown time   ? [DISCONTINUED] bisacodyl 5 mg EC tablet Take 1 tablet (5 mg total) by mouth daily as needed for Constipation. 100 tablet 0    ? [DISCONTINUED] heparin 5000 unit/mL injection Inject 1 mL (5,000 Units total) under the skin every twelve (12) hours.      ? [DISCONTINUED] hydrALAZINE 10 mg tablet Take 1 tablet (10 mg total) by mouth every six (6) hours.      ? [DISCONTINUED] lactated ringers IV soln Inject 50 mL/hr into the vein continuous. (Patient not taking: Reported on 05/11/2019.)   Not Taking at Unknown time   ? [DISCONTINUED] pantoprazole 40 mg DR tablet Take 1 tablet (40 mg total) by mouth daily.      ? [DISCONTINUED] senna 8.6 mg tablet Take 1 tablet by mouth at bedtime as needed for Constipation. (Patient not taking: Reported on 05/11/2019.)   Not Taking at Unknown time     Inpatient Medications:  Scheduled Meds:  ? acetaminophen  650 mg Oral Q6H   ? allopurinol  50 mg Oral Daily   ? aspirin  81 mg Oral Daily   ? ferrous sulfate  325 mg Oral Every Other Day   ? gabapentin  100 mg Oral QHS   ? heparin  5,000 Units Subcutaneous Q12H   ? hydroxyurea  1,000 mg Oral BID   ? levothyroxine  50 mcg Oral Every Other Day   ? levothyroxine  75 mcg Oral Every Other Day   ? lidocaine  1 patch Transdermal Q24H   ? lisinopril  10 mg Oral Daily   ? magnesium oxide  400 mg Oral Daily   ? pantoprazole  40 mg Oral Daily   ? rosuvastatin  20 mg Oral QHS     Continuous Infusions:  ? lactated ringers 75 mL/hr (05/13/19 1541)     PRN Meds:.analgesic balm, bisacodyl, diclofenac Sodium, hydrALAZINE, lidocaine PF, magnesium hydroxide, melatonin, senna    Vital signs:  Vitals:    05/14/19 0559 05/14/19 0916 05/14/19 1207 05/14/19 1639   BP: 164/70 171/78 176/72 176/71   Patient Position:       Pulse: 84 98 (!) 101 90   Resp: 16 18 18 18    Temp: 36.3 ?C (97.3 ?F) 36.8 ?C (98.3 ?F) 37.1 ?C (98.7 ?F) 36.7 ?C (98.1 ?F)   TempSrc: Temporal Temporal Temporal Temporal   SpO2: 94% 97% 97% 96%   Weight:       Height:         Vital sign ranges (24h):  Temp:  [36.3 ?C (97.3 ?F)-37.5 ?C (99.5 ?F)] 36.7 ?C (98.1 ?F)  Heart Rate:  [84-101] 90  Resp:  [16-20] 18  BP: (162-176)/(64-78) 176/71  NBP Mean:  [98-109] 106  SpO2:  [93 %-97 %] 96 %    Intake/Output (24h):    Intake/Output Summary (Last 24 hours) at 05/14/2019 1832  Last data filed at 05/14/2019 1815  Gross per 24 hour   Intake 2595 ml   Output 2400 ml   Net 195 ml       Physical Exam:  General: Appears well-developed, well-nourished and close to stated age.   CV: Regular in rate and rhythm, no murmurs or gallops.   Chest: Clear to auscultation bilaterally without wheezing or rhonchi. No crackles noted. Respiratory effort appears normal.   Abdomen: Soft, nontender and nondistended. Bowel sounds are present and normoactive. +splenomegaly  Musculoskeletal: No edema. No cyanosis. Extremities are warm and well-perfused.   Psychiatric: Affect appropriate.  Pleasant and conversant.     Labs:    Lab Results   Component Value Date    ALT 14 05/10/2019    AST 32 05/10/2019    ALKPHOS 145 (H) 05/10/2019    BILITOT 0.6 05/10/2019     Lab Results   Component Value Date    CREAT 1.79 (H) 05/14/2019    BUN 43 (H) 05/14/2019    NA 139 05/14/2019    K 4.3 05/14/2019    CL 105 05/14/2019    CO2 23 05/14/2019     Lab Results   Component Value Date    WBC 135.44 (H) 05/14/2019    HGB 9.1 (L) 05/14/2019    HCT 29.8 (L) 05/14/2019    MCV 91.7 05/14/2019    PLT 222 05/14/2019    NEUTPCT 52 01/18/2019    NEUTABS 32.2 (H) 01/18/2019    MONOPCT 14 01/18/2019    MONOABS 8.5 (H) 01/18/2019     Coags:  Recent Labs     05/14/19  0911 05/14/19  0103 05/13/19  1218   PT 17.3* 17.7* 17.6*   INR 1.5 1.6 1.5   APTT 53.4* 69.4* 64.3*   FIBRINOGEN 226* 215* 229*   DDIMER 2.52* 2.40* 2.37*     Flow cytometry  INTERPRETATION:  Small population of myeloblasts (1% of total events)   ?  COMMENT:  Marked leukocytosis  with neutrophilia, monocytosis and rare blasts. Please correlate with FISH studies.    Pertinent imaging:  05/10/19 abd US  IMPRESSION:  1. Marked splenomegaly.  Mild hepatomegaly.  2. Borderline right hydronephrosis.  ?  05/09/19 CXR  IMPRESSION:  ?  Stable cardiomediastinal silhouette with mildly tortuous, calcified thoracic aorta. Persistent mild cardiomegaly.  Normal lung volumes. Increased density of the bilateral lower lung zones is accentuated due to overlying breast implants.  There are bilateral lower lobe patchy consolidations, most suggestive of pulmonary edema versus pneumonia.   No right pleural effusion. Trace left pleural effusion. No pneumothorax. Elevation of the left hemidiaphragm  No acute osseous abnormalities.  ?    Pertinent pathology:  Patlent Name: KAELIE, HENIGAN Accession#: [id number] Med. Rec. # [id number] Taken: 10/30/2016 Encounter #: [id number] Received: 10/30/2016 Gender: F Reported: 11/04/2016 16:53 DOB: 02/27/1934 (Age: 47)  Submitting Physician: Hubert Azure D M.D.  Additional Physician(s):  Location: [facility]  Clinical Information:  85 year old with remota history of mild pancytopenia in 2008. There ara no recent history or CBC studies.  Specimen Received:  A: Bone marrow,aspirate for clot section, pic,cyto sent to iu B: Bone marrow, biopsy (decalelfied),Ipic  ?  Final  Pathologic Diagnosis:  id B. Bone marrow, bio and aspirate for clot section, Jeft posterioriliac crest: Myeloid neoplasm. See note.  As the senior physician, ~ attest that I: (i) examined the relevant preparation(s) for the specimen(s); and (ii) rendered or confirmed the diagnosis(es).  ?Electronically Signed ngz1/10/30/2016 Jobie Quaker, M.D. (Pathologist)  Deanne Coffer, M.D. 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 Clinical correlation and correlation with cytogenetics/FISH study including BCR-ABL7 fusion study is recommended for further classification.  Dr. 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  ?  Cellularity: Markedly hypercellular (> 90%) Myslopoiesis: Markedly increased with full maturation Erythropoiesis: Decreased Megakaryopoiesis: Markedly increased with frequent hypolobated/monolobated megakaryocytes including small hyperchromatic forms Additional studles: Reticulin stain shows mild increasein reticulin fibrosis (1+) with appropriate control,  Theimmunohistechemical stains are performedin this tase and same of the antigens may also ba evaluated by flow cytometry, Concument immunchistechemieal stains on tissue sections are Indicated in order to correlate immunophenotype with cell morphology, evaluate spatial dletribution/architectural features and/or for a quantitation of disease involvement  Bone marrow,aspirate smear:  Sample quality: Adequate  ME ratio: Markedly increased, ~ 8:1 Blasts: Not increased (4%) ' Myelopoiesis: Markedly increased with occasional hypolobated neutrophils Erythropoiesis: Decreased with megaloblastoid changes Megakaryopoiesis: Markedly increased with frequent hypolobated/monolobated megakaryocytes Comments: No clusters of blasts, no Auer rods. Additional studies: Iron stain performed at the [facility] showsincreased storage iron with no ring sideroblasts. Control stains appropriately,  Bone marrow differential:  Percent  Blasts 2  Promyeltocytes 7  Myelocytes 23  Metamyelocytes 6  Bands 16  Polys 30  Lymphocytes 3  Monocytes 2  Eosinophils 4  Basophils 1  Nucleated RBCs 10  Peripheral blood, smear:  CBC: WBC 46 k/cumm, hemoglobIn 10,7 g/dL, MCV 81 fL, ROW 40.3%, platelet count 316 k/cumm Differential: Neutrophils 57%, lymphocytes 5%, monocytes 21%, myelocytas 3%, Metamyelocytes 8%, band 6%  RBCs: Normocytic anaemia with anlsopgikilocytosis. WEBCs: Leucocytosis, granulocytosis with left-shift, monocytosis. Platelets: Normal platelet caunt with occasional giantplatelets  ?  Results-Comments  CD34 and p53 stains show rare CD34+ blasts (<5%) and exceedingly rare p53+ cells. Controls stain appropriately.  ?  INTERERETATION: (600 cell diff)  ?  Bone Marrow Aspirate  specimen is adequate and is markedly hypercellular. Spicules are present.  Megakaryocytes Are increaged in numberwith occasional small mono/hypolobated forms and rare small separated lobe forms identified. My elopoiesis Is abnormal, increased, and proceeds to completion. Blasts and promonocytes comprise 6% of total nucleated cells. 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 The M/E ratio is 8.871.  Erythroid series Shows occasional megaloblastoid changes.  Lymphoid Lymphocytes comprise 2% of total nucleated cells,  Comments: Correlate with bone marrow biopsy and cytogenetic studies,  Tron Stain Results: +2/6 Iron in reticulum, no ringed formsidentified.  Control: positive.  IMPRESSION: DATESIGNED OUT: 11/01/2016 TIME: 10:10 CMML-1.  COMMENT: The marrow biopsy was not available at the time of finalizing the aspirate report. In the event of a marrow biopsy report with discrepant results an addendum will be provided by [facility].  ?  My blast count was 8% ( 300 cell differential) with 15% monocytes; trilineage dysplasia; increased numbers of megakaryocytes and 90-100% cellular particles.  Becauseofthe high WBC I did my counting very close to the particles to void any dilution effect from the peripheral blood.  1% blasts in PBS and 19% monocytes.  JMB      Impression and Recommendations:  Giannie Soliday is a 85 y.o. 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 We are asked to consult regarding management of hyperleukocytosis.    Patient with symptomatic hyperleukocytosis. I recommended we start hydroxyurea 500mg  BID (50% dose reduction for CrCl<60)  Spoke with patients primary hematologist Dr. Marcellus Scott who is in agreement with hydrea. Would wish to avoid HMA given her age. Anticipate to see improvement in WBC within 24-48 hours. Suspect her dizziness and hypoxia are a result of elevated WBC, however I do not think she needs urgent leukopheresis give hypoxia is improving.  We will consider leukopheresis if symptoms do not improve   Continue IVF and allopurinol for elevated uric acid  Follow up flow cytometry results    05/12/19 continue hydrea 500mg  BID. Reviewed flow cytometry results with hemepath Dr. Reginia Forts - approximately 2% blasts, numerous neutrophils and monocytes. Continue allopurinol and TLS labs. 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 May increase dose of hydrea tomorrow if dose not continue to downtrend further     05/14/19 increase hydrea to 1000mg  BID. Monitor hemoglobin and platelets closely. start epo is h/h drops <8    Thank you for this consultation. We will continue to follow the patient. Lenia Housley, D.O.  Hematology/Oncology

## 2019-05-15 NOTE — Progress Notes
DAILY GERIATRIC PROGRESS NOTE    DATE OF SERVICE: 05/15/2019  HOSPITAL DAY: 5  CHIEF COMPLAINT: No chief complaint on file.    ID: [id number] with history of CKD 4, HTN, and CMML presenting with lightheadedness and SOB, found to have hyperleukocytosis (WBC >100k), for which she is admitted.    INTERVAL EVENTS:  - TLS labs stable/improving  --> uric acid 12.8 --> 7.7  - Cr back to baseline (~1.8) after starting lisinopril 10mg   - DIC labs stable/improving  --> INR 1.5  - hydroxyurea increased to 1g BID (from 500mg  BID) with improving WBC (93k 4/10)  - seen by renal: L pelviectasis seen on renal u/s likely 2/2 old (passed) kidney stone; started on lisinopril 10mg  for HTN and proteinuria (UMA 377mg /g) and will do CT KUB if Cr >3.0, otherwise no recs  - TTE showing moderate AS and diastolic dysfunction    SUBJECTIVE:  - cough overnight that responded well to guaifenesin, otherwise slept well with no acute events  - on RA overnight  - Otherwise no new cardiac, respiratory, or GI symptoms.    MEDICATIONS:  Scheduled:  acetaminophen, 650 mg, Oral, Q6H  allopurinol, 50 mg, Oral, Daily  aspirin, 81 mg, Oral, Daily  ferrous sulfate, 325 mg, Oral, Every Other Day  gabapentin, 100 mg, Oral, QHS  heparin, 5,000 Units, Subcutaneous, Q12H  hydroxyurea, 1,000 mg, Oral, BID  levothyroxine, 50 mcg, Oral, Every Other Day  levothyroxine, 75 mcg, Oral, Every Other Day  lidocaine, 1 patch, Transdermal, Q24H  lisinopril, 20 mg, Oral, Daily  magnesium oxide, 400 mg, Oral, Daily  pantoprazole, 40 mg, Oral, Daily  rosuvastatin, 20 mg, Oral, QHS  PRN:  analgesic balm, bisacodyl, diclofenac Sodium, guaiFENesin, hydrALAZINE, lidocaine PF, magnesium hydroxide, melatonin, senna  Infusions:  ? lactated ringers 75 mL/hr (05/14/19 1904)       PHYSICAL EXAM:  Temp:  [36.4 ?C (97.5 ?F)-37.1 ?C (98.7 ?F)] 36.4 ?C (97.6 ?F)  Heart Rate:  [90-101] 91  Resp:  [16-18] 18  BP: (164-176)/(67-78) 166/67  NBP Mean:  [100-109] 100  SpO2:  [94 %-97 %] 94 %  I/O: I/O last 2 completed shifts:  In: 3900 [P.O.:600; I.V.:2700; Other:600]  Out: 1650 [Urine:1650]   +600cc    General:   Well-appearing in NAD   Eyes:   PERRL, EOM grossly intact, sclerae and conjunctivae anicteric and non-injected bilaterally with no pallor or exudates.   Ears:   Hearing grossly intact   Nose/Mouth:  MMM without exudates, ulcers, or petechiae.   Neck:  Normal range of motion. No JVD   Lymph:   No anterior/posterior cervical or submandibular LAD.   Lungs:   No increased WOB, no dullness to percussion, good air movement bilaterally, less crackles at L lung base than on prior exams, otherwise CTAB with no RWR.   Chest/Heart:   RRR, normal S1 S2, crescendo-decrescendo 2/6 systolic murmur heard best at 2nd R intercostal space, otherwise no MGR.    Abdomen:   Non-distended. No tenderness or masses on palpation, no rebound or guarding. +Splenomegaly   GU:   Not indicated.   Skin/integument:  No rashes, lesions, or breakdown. Fingernails with normal contour, color, and lunulae.   Extremities:   No cyanosis, clubbing, or edema. TTP along L trapezius improved from prior exam. Preserved ROM of L shoulder. Negative empty can test.   Neuro:   Alert and interactive, cranial nerves II-XII grossly intact, ambulates with shuffling gait without assistance       CAM DELIRIUM TEST:  Feature One:   A. Acute Change in mental status from baseline? []  yes [x]  no   B. Is there fluctuation in the mental status? []  yes [x]  no    Feature Two:  A. Does that patient have difficulty focusing attention?  []  yes [x]  no   (days of week, serial 7s)    Feature Three:  A.Is there evidence of disorganized thinking?  []  yes [x]  no    Feature Four:  A. Does the patient have an altered level of consciousness (ie: vigilant, lethargic, stuporous, comatous?  []  yes [x]  no    The diagnosis of delirium by CAM requires the presence of features 1 and 2 and either 3 or 4      DATA:  I have reviewed the following information from the last 24 hours: allied health and treating physician notes, imaging, labs and microbiology data and cardiac studies and telemetry data:    Lab Results   Component Value Date    WBC 91.32 (H) 05/15/2019    HGB 7.7 (L) 05/15/2019    HCT 25.4 (L) 05/15/2019    PLT 159 05/15/2019     Lab Results   Component Value Date    NA 137 05/15/2019    K 4.2 05/15/2019    CL 105 05/15/2019    CO2 22 05/15/2019    BUN 45 (H) 05/15/2019    CREAT 1.84 (H) 05/15/2019    GLUCOSE 84 05/15/2019    CALCIUM 9.0 05/15/2019    MG 1.3 (L) 05/11/2019    PHOS 5.3 (H) 05/15/2019     Lab Results   Component Value Date    APTT 65.1 (H) 05/15/2019    PT 17.1 (H) 05/15/2019    INR 1.5 05/15/2019     Lab Results   Component Value Date    ALT 16 05/14/2019    AST 37 05/14/2019    BILITOT 0.4 05/14/2019    ALKPHOS 202 (H) 05/14/2019    ALBUMIN 4.0 05/14/2019     Lab Results   Component Value Date    TSH 5.5 (H) 05/10/2019     Lab Results   Component Value Date    CHOL 77 (L) 01/18/2019    CHOLHDL 19 (L) 01/18/2019    TRIGLY 165 (H) 01/18/2019       MICRO:  Covid-19 PCR negative, 05/10/19    IMAGING:     TTE, 05/13/19:    CONCLUSIONS   1. Technically difficult study.   2. Normal left ventricular size.   3. Mild concentric left ventricular hypertrophy.   4. Left ventricular ejection fraction is approximately 60 to 65%.   5. Abnormal LV diastolic function (Grade I).   6. Moderate aortic valve stenosis with an aortic valve area of 1.15 cm? (index 0.67 cm?/m?).   7. Mild mitral valve regurgitation.   8. The peak TR velocity is not well defined, and therefore, pulmonary artery systolic pressure cannot be calculated. Based on the acceleration time in the RV outflow tract, the PA pressure is likely to be elevated.   9. There are no prior studies on this patient for comparison purposes.    L Shoulder x-ray, 05/13/19:  IMPRESSION:  1. No bone lesions.  2. Mild glenohumeral osteoarthritis. Anterior acromial spur, mild acromiohumeral narrowing and spur formation on the greater tuberosity suggest chronic rotator cuff disease.    Renal ultrasound, 05/11/19:  IMPRESSION:  ?  Mild right hydronephrosis, unchanged since prior.  Left pelviectasis.    Abdominal ultrasound, 05/11/19:  IMPRESSION:  ?  1. Marked splenomegaly.  Mild hepatomegaly.  ?  2. Borderline right hydronephrosis.    CXR, 05/10/19:  IMPRESSION:  ?  Stable cardiomediastinal silhouette with mildly tortuous, calcified thoracic aorta. Persistent mild cardiomegaly.  Normal lung volumes. Increased density of the bilateral lower lung zones is accentuated due to overlying breast implants.  There are bilateral lower lobe patchy consolidations, most suggestive of pulmonary edema versus pneumonia.   No right pleural effusion. Trace left pleural effusion. No pneumothorax. Elevation of the left hemidiaphragm  No acute osseous abnormalities.  ?  Pertinent pathology:  [place] Name: Lindsay Brennan, Lindsay Brennan Accession#: [id number] Med. Rec. # [id number] Taken: 10/30/2016 Encounter #: [id number] Received: 10/30/2016 Gender: F Reported: 11/04/2016 16:53 DOB: [birth year] (Age: 14)  Submitting Physician: Hubert Azure D M.D.  Additional Physician(s):  [facility]  Clinical Information:  85 year old with remota history of mild pancytopenia in 2008. There ara no recent history or CBC studies.  Specimen Received:  A: Bone marrow,aspirate for clot section, pic,cyto sent to iu B: Bone marrow, biopsy (decalelfied),Ipic  ?  Final  Pathologic Diagnosis:  id B. Bone marrow, bio and aspirate for clot section, Jeft posterioriliac crest: Myeloid neoplasm. See note.  As the senior physician, ~ attest that I: (i) examined the relevant preparation(s) for the specimen(s); and (ii) rendered or confirmed the diagnosis(es).  ?Electronically Signed ngz1/10/30/2016 Jobie Quaker, M.D. (Pathologist)  Deanne Coffer, M.D. (Resident) Signing Location: [facility], [address] [place], [place] [address]  Note:  The majordifferential diagnosis includes chronic myelomonocytic leukemia. Clinical correlation and correlation with cytogenetics/FISH study including BCR-ABL7 fusion study is recommended for further classification.  Dr. 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  ?  Cellularity: Markedly hypercellular (> 90%) Myslopoiesis: Markedly increased with full maturation Erythropoiesis: Decreased Megakaryopoiesis: Markedly increased with frequent hypolobated/monolobated megakaryocytes including small hyperchromatic forms Additional studles: Reticulin stain shows mild increasein reticulin fibrosis (1+) with appropriate control,  Theimmunohistechemical stains are performedin this tase and same of the antigens may also ba evaluated by flow cytometry, Concument immunchistechemieal stains on tissue sections are Indicated in order to correlate immunophenotype with cell morphology, evaluate spatial dletribution/architectural features and/or for a quantitation of disease involvement  Bone marrow,aspirate smear:  Sample quality: Adequate  ME ratio: Markedly increased, ~ 8:1 Blasts: Not increased (4%) ' Myelopoiesis: Markedly increased with occasional hypolobated neutrophils Erythropoiesis: Decreased with megaloblastoid changes Megakaryopoiesis: Markedly increased with frequent hypolobated/monolobated megakaryocytes Comments: No clusters of blasts, no Auer rods. Additional studies: Iron stain performed at the [facility] showsincreased storage iron with no ring sideroblasts. Control stains appropriately,  Bone marrow differential:  Percent  Blasts 2  Promyeltocytes 7  Myelocytes 23  Metamyelocytes 6  Bands 16  Polys 30  Lymphocytes 3  Monocytes 2  Eosinophils 4  Basophils 1  Nucleated RBCs 10  Peripheral blood, smear:  CBC: WBC 46 k/cumm, hemoglobIn 10,7 g/dL, MCV 81 fL, ROW 84.6%, platelet count 316 k/cumm Differential: Neutrophils 57%, lymphocytes 5%, monocytes 21%, myelocytas 3%, Metamyelocytes 8%, band 6%  RBCs: Normocytic anaemia with anlsopgikilocytosis. WEBCs: Leucocytosis, granulocytosis with left-shift, monocytosis. Platelets: Normal platelet caunt with occasional giantplatelets  ?  Results-Comments  CD34 and p53 stains show rare CD34+ blasts (<5%) and exceedingly rare p53+ cells. Controls stain appropriately.  ?  INTERERETATION: (600 cell diff)  ?  Bone Marrow Aspirate specimen is adequate and is markedly hypercellular. Spicules are present.  Megakaryocytes Are increaged in numberwith occasional small mono/hypolobated  forms and rare small separated lobe forms identified. My elopoiesis Is abnormal, increased, and proceeds to completion. Blasts and promonocytes comprise 6% of total nucleated cells. 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 The M/E ratio is 8.871.  Erythroid series Shows occasional megaloblastoid changes.  Lymphoid Lymphocytes comprise 2% of total nucleated cells,  Comments: Correlate with bone marrow biopsy and cytogenetic studies,  Tron Stain Results: +2/6 Iron in reticulum, no ringed formsidentified.  Control: positive.  IMPRESSION: DATESIGNED OUT: 11/01/2016 TIME: 10:10 CMML-1.  COMMENT: The marrow biopsy was not available at the time of finalizing the aspirate report. In the event of a marrow biopsy report with discrepant results an addendum will be provided by [facility].  ?  My blast count was 8% ( 300 cell differential) with 15% monocytes; trilineage dysplasia; increased numbers of megakaryocytes and 90-100% cellular particles.  Becauseofthe high WBC I did my counting very close to the particles to void any dilution effect from the peripheral blood.  1% blasts in PBS and 19% monocytes.  JMB    ASSESSMENT/PLAN:   Lindsay Brennan is a 85 y.o. 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  ?  #Symptomatic Hyperleukocytosis   #History of chronic myelomonocytic leukemia type-1  Patient's lightheadedness and SOB may be secondary to WBC >100k, though other than elevated Cr no e/o end-organ damage. Blasts <5% (4% on admission) making blast crisis less likely, though patient declining bone marrow bx to determine extent of blast cells in bone marrow. Patient has not received treatment previously, and notably CMML is not driven by BCR-ABL mutation. Evaluated by hematology/oncology who recommend hydroxyurea (dose-reduced for eGFR <60); if no improvement in WBC or worsening in symptoms will consider leukapheresis. Toxicities of hypo-methylating agents (treatment of choice in MDS/CMML) likely outweigh benefits in this 85yo patient, and regardless patient hopes to return to [place] for consideration of more aggressive treatment once she is stabilized. 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 Of note, no e/o of infection to explain marked increase in WBC from prior baseline (previously 45k in October 2020), though will have low threshold to start antibiotics, noting PCN allergy (hives). Elevated procalcitonin (1.12) difficult to interpret in setting of high circulating cytokines from heme malignancy.  []  heme/onc consulted, appreciate recs:  - hydroxyurea started 4/6 --> increased to 1g BID on 4/9 given persistently elevated WBC count (>100k) and improving GFR.  --> if symptoms worsen, will consider leukapheresis   -Trend TLS labs (uric acid, phos, K, iCa) q12h  -Trend DIC labs (INR, PTT, fibrinogen, D-dimer, platelets) q12h  --> maintain platelets >30lk  --> maintain fibrinogen >100  -Aggressive hydration (1.5cc/kg per hour --> 75cc/h in this patient)  --> IV Lasix 20mg  prn if worsening oxygenation or high positive fluid balance on strict I/Os  -Follow-up hematology malignancy panel  -Follow-up peripheral blood flow cytometry    #Acute hypoxic respiratory failure:  #HFpEF, new diagnosis:  BNP elevated on admission (532) with TTE on 05/13/19 showing grade I diastolic dysfunction. Hypoxia on presentation likely 2/2 volume overload given improvement with IV Lasix.   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 Suspect hypervolemia due to renal insufficiency. BNP mildly elevated. No acid/base disorder noted on 4/5 VBG. UA without significant pyuria. L pelviectasis on renal ultrasound but per renal this is likely sequelae of prior nephrolithiasis. UMA 377mg /g.  []  renal consulted, appreciate recs:  - lisinopril restarted 4/8 --> increase to 20mg  given Cr stable and BP persistently elevated, for long-term benefit in CKD  - trend Cr daily  - allopurinol 50mg     #Lightheadness, POA, improving:  #Moderate Aortic Stenosis, new diagnosis:  Symptoms appears to be orthostatic in the setting of diltiazem, though orthostatics notably negative in ED. Patient reports improvement being off diltiazem. However, query component of deconditioning. TTE on 05/13/19 showing moderate AS, though unlikely to be cause of symptoms.  []  hold home diltiazem 180mg  and clonidine 0.1mg  TID  []  fall precautions --> junior walker ordered per PT recs  []  PT/OT following  []  repeat TTE April 2022 to assess for progression of AS  ?  #Elevated INR, POA, improving:  INR 1.9 on admission. This raises c/f synthetic liver dysfunction due to leukostasis or DIC, which can occur in up to 40% of patients with hyperleukocytosis. Per heme/onc, high INR likely due to factor X deficiency from factor X binding to atypical monocytes. Reassuringly, other DIC labs WNL (other than D-dimer), and other liver labs WNL (including albumin at 3.5). Consider nutritional deficiency given recent weigh loss, or consumptive coagulopathy given splenomegaly. INR now improving.  []  CTM, will consider vit K administration if increasing INR, after discussion with heme/onc  ?  #Anemia, POA, worsening: Hgb 8.9 on admission --> 7.7 on 4/10. Chronic anemia likely 2/2 renal disease and CMML. Iron studies not c/w iron deficiency anemia (ferritin 640, 28% transferrin saturation). Will give epo given Hb <8.0 (noting worsening anemia as possible adverse effect of hydroxyurea).  - trend CBC daily  - continue home iron supplementation (given CKD and on epo)  ?  #Hypertension:   Hold home diltiazem 180mg  and clonidine 0.1mg  TID given possibly contributing to lightheadedness and dizziness. Will spot dose with hydralazine and if BP persistently >160 will consider standing amlodipine.  []  continue lisinopril 20mg  as above --> up-titrate prn  - hydral 25mg  PO q6h prn for SBP >180 and/or DBP >110    #L shoulder pain, chronic but worsening:  X-ray suggestive of chronic rotator cuff disease, however exam also with TTP along L trapezius suggestive of myofascial pain syndrome.   []  [place] consulted, now s/p trigger point injection on 4/8 with improvement in symptoms  - diclofenac 1% gel prn  - lidocaine patch prn  - acetaminophen RTC    Chronic/Stable Medical Problems  #Subclinical hypothyrodism: TSH 5.2 on admission  -Continue levothyroxine , on alternating days   ?  #HLD:   -Continue rosuvastatin?20mg   ?  #Peripheral neuropathy:  -Continue gabapentin but dose reduce to 100mg  at bedtime (from 200mg ) given worsening kidney function and dizziness  ?  Inpatient Checklist  Diet: renal (low K, low phos)  GI ppx: home PPI  DVT ppx: heparin subq  Lines: pIV  Tubes/Drains: none  ?  CODE STATUS: Full Code  Primary Emergency Contact: Margot Ables [phone])  ?  Disposition:  TBD (pending PT/OT eval)  ?  Ambulatory Status & Fall Risk  [] ? non-ambulatory  [] ? walks with cane and/or walker  [x] ? walks independently  [] ? walks independently but may benefit from an assistive  device evaluation with physical therapy  [] ? needs supervision with walking secondary to poor safety awareness  ?  Discharge Considerations:  [x] ? recommend PT evaluation to see if appropriate for skilled nursing facility   [x] ? recommend increase level of care/supervision then patient currently has at home  [] ? recommend 24 hour supervision  [] ? recommend home health services  [] ? after stabilization patient may return to prior living situation as it is adequate to meet his/her needs  [] ? recommend new assistive device for ambulation, safety, and fall risk  [x] ? will re-evaluate later in the hospital course once further stabilized    This patient has an advanced directive:  Yes []   No [x]  Unknown []     This patient's designated decision maker if or when patient lacks capacity is: Daughter Kim  Code Status (designated by patient): Full Code    Discussed with attending, Dr. Alba Cory    Signed,  Henderson Cloud, MD/MBA  Internal Medicine, PGY-3  [phone]

## 2019-05-15 NOTE — Progress Notes
RENAL CONSULTATION INPATIENT  Referring Physician: Alba Cory   CC: CKD    Interval   05/15/2019 no complains, shoulder feeling better     4/9 ongoing shoulder pain; Received trigger point injections     4/8 complains of shoulder pain on the right which is overshadowing the dizziness    4/7:  BP elevated. Started hydroxyurea    History of Present Illness  85 y.o. F with proteinuric CKD admitted for dizziness and shortness of breath, found to have hyperleukocytosis.     Over the past few years the creatinine has gradually trended up into the 2s. A few years ago a nephrologist in [place] (her regular nephrologist) advised her to stop lisinopril in the setting of getting a colonoscopy, ''because the kidney disease is very advanced''. She had undergone a renal artery angiogram which showed nonobstructive atherosclerosis. There is mention of rapidly progressive kidney disease but the records I was able to see in care everywhere show a more slow rise in creatinine over the past few years.     She has been visiting her daughter [name] (who is at the bedside) for the past several months and during this time has been following the CKD clinic in [place]. They recently started her on diltiazem for the proteinuria which correlated with onset of the dizziness, though the dilt was reduced, and then stopped prior to this admission, but she does not feel any better.   She does report a remote hx of kidney stones assoc with renal colic. However there have been no recent events of this.   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   She has a hx of HTN     Medications  Allergies   Allergen Reactions   ? Oxycodone-Acetaminophen Other (See Comments)   ? Penicillins Hives   ? Sulfa Antibiotics Agranulocytosis   ? Naproxen Rash     Aleve     ? acetaminophen  650 mg Oral Q6H   ? allopurinol  50 mg Oral Daily   ? aspirin  81 mg Oral Daily   ? [START ON 05/17/2019] epoetin alfa-epbx  150 Units/kg Subcutaneous Once per day on Mon Wed Fri   ? ferrous sulfate  325 mg Oral Every Other Day   ? gabapentin  100 mg Oral QHS   ? heparin  5,000 Units Subcutaneous Q12H   ? hydroxyurea  1,000 mg Oral BID   ? levothyroxine  50 mcg Oral Every Other Day   ? levothyroxine  75 mcg Oral Every Other Day   ? lidocaine  1 patch Transdermal Q24H   ? lisinopril  20 mg Oral Daily   ? magnesium oxide  400 mg Oral Daily   ? pantoprazole  40 mg Oral Daily   ? rosuvastatin  20 mg Oral QHS     analgesic balm, bisacodyl, diclofenac Sodium, guaiFENesin, hydrALAZINE, lidocaine PF, magnesium hydroxide, melatonin, senna    EXAM  BP 155/72  ~ Pulse 92  ~ Temp 36.2 ?C (97.2 ?F) (Temporal)  ~ Resp 17  ~ Ht 1.549 m (5' 1'')  ~ Wt 48.8 kg (107 lb 8 oz)  ~ SpO2 95%  ~ BMI 20.31 kg/m?     Intake/Output Summary (Last 24 hours) at 05/15/2019 1609  Last data filed at 05/15/2019 1400  Gross per 24 hour   Intake 3210 ml   Output 1950 ml   Net 1260 ml     General: NAD, well-developed  Eyes: Conjunctiva  and lids normal; Pupils equal, Anicteric  ENMT: normal facial anatomy with no traumatic lesions; normal mucosa, no discharge  Neck: supple, trachea midline, no thyromegaly  Respiratory: Normal work of breathing, CTAB  Cardiovascular: regular, no added heart sounds; JVP normal; no edema  Abdomen: soft, NT/ND, no HSM  Vascular: symmetric 2+ pedal pulses; no ulcerations  Skin: warm and dry; normal coloration  Neuro: CN intact; moves all ext  Psych: alert, oriented; normal affect; appropriate judgement and insight    4/10: seated comfortably in bedside chair.     LABS  Lab Results   Component Value Date    CREAT 1.84 (H) 05/15/2019    BUN 45 (H) 05/15/2019    NA 137 05/15/2019    K 4.2 05/15/2019    CL 105 05/15/2019    CO2 22 05/15/2019       ASSESSMENT  85 y.o. delightful female with CKD of unknown cause, characterized by slow progression, subnephrotic proteinuria, and HTN. Admitted for dizziness likely due to hyperleukocytosis.     1. CKD stage 4 with proteinuria and intermittent microhematuria, mild bump in creat v fluctuations due to leukostasis. Now returning to baseline   2. 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 Coagulopathy   4. Hyponatremia mild   5. R hydro mild ? Significance. Not likely causing AKI. Could be sequella from prior stone   6. HTN  7. Prior kidney stones (Seen R side ultrasound 2020, per report) - treated with allopurinol for a time now off        RECOMMENDATIONS  Cont same meds   Check BMP in AM     Elevated risk of recurrent AKI:  - Avoid nephrotoxic injury  - Dose all medications to current eGFR      Clementeen Graham, MD

## 2019-05-15 NOTE — Consults
SPRITUAL CARE CONSULTATION NOTE    PATIENT:  Lindsay Brennan  MRN:  [id number]     Patient Info        Religious/Spiritual Identity:        Mina Marble       Last Anointed Date:                 Baptised:                 Spiritual Care Visit Details              Date of Visit:  05/14/19  Time of Visit:  1410  Visited with Patient   Visit length 15 Minutes   Referral source Nurse   Reason for visit Initial visit/assessment, Consult order, Spiritual/Emotional support      Spiritual Assessment     Spiritual practices & resources Personal faith/Spiritual beliefs, Family/Friends, Prayer   Areas of spiritual/emotional distress Adjustment to illness/hospitalization, Concerns for health and healing   Distressful feelings Not applicable on this visit   Indicators of spiritual wellbeing Able to receive love and support   Expressions of spiritual wellbeing Expresses desire to get well      Plan     Spiritual care intervention Introduction to chaplain services, Offered words of comfort/encouragement, Prayer   Outcomes (per patient/family) Appreciated visit   Spiritual care plans Continue to visit as needed   Additional comments .      Recommendation             Author:  Doreatha Martin 05/14/2019 5:07 PM  Contact info: SM pager: [phone] ext: [phone]

## 2019-05-15 NOTE — Progress Notes
RENAL CONSULTATION INPATIENT  Referring Physician: Early Osmond  CC: CKD    Interval   05/14/2019 ongoing should pain; no dizziness though only walked from chair to bathroom inside her room. Received trigger point injections     4/8 complains of shoulder pain on the right which is overshadowing the dizziness    4/7:  BP elevated. Started hydroxyurea    History of Present Illness  85 y.o. F with proteinuric CKD admitted for dizziness and shortness of breath, found to have hyperleukocytosis.     Over the past few years the creatinine has gradually trended up into the 2s. A few years ago a nephrologist in [place] (her regular nephrologist) advised her to stop lisinopril in the setting of getting a colonoscopy, ''because the kidney disease is very advanced''. She had undergone a renal artery angiogram which showed nonobstructive atherosclerosis. There is mention of rapidly progressive kidney disease but the records I was able to see in care everywhere show a more slow rise in creatinine over the past few years.     She has been visiting her daughter [name] (who is at the bedside) for the past several months and during this time has been following the CKD clinic in [place]. They recently started her on diltiazem for the proteinuria which correlated with onset of the dizziness, though the dilt was reduced, and then stopped prior to this admission, but she does not feel any better.   She does report a remote hx of kidney stones assoc with renal colic. However there have been no recent events of this.   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   She has a hx of HTN     Medications  Allergies   Allergen Reactions   ? Oxycodone-Acetaminophen Other (See Comments)   ? Penicillins Hives   ? Sulfa Antibiotics Agranulocytosis   ? Naproxen Rash     Aleve     ? acetaminophen  650 mg Oral Q6H   ? allopurinol  50 mg Oral Daily   ? aspirin  81 mg Oral Daily   ? ferrous sulfate  325 mg Oral Every Other Day   ? gabapentin 100 mg Oral QHS   ? heparin  5,000 Units Subcutaneous Q12H   ? hydroxyurea  500 mg Oral BID   ? levothyroxine  50 mcg Oral Every Other Day   ? levothyroxine  75 mcg Oral Every Other Day   ? lidocaine  1 patch Transdermal Q24H   ? lisinopril  10 mg Oral Daily   ? magnesium oxide  400 mg Oral Daily   ? pantoprazole  40 mg Oral Daily   ? rosuvastatin  20 mg Oral QHS     analgesic balm, bisacodyl, diclofenac Sodium, hydrALAZINE, lidocaine PF, magnesium hydroxide, melatonin, senna    EXAM  BP 176/71  ~ Pulse 90  ~ Temp 36.7 ?C (98.1 ?F) (Temporal)  ~ Resp 18  ~ Ht 1.549 m (5' 1'')  ~ Wt 47.4 kg (104 lb 8 oz)  ~ SpO2 96%  ~ BMI 19.75 kg/m?     Intake/Output Summary (Last 24 hours) at 05/14/2019 1704  Last data filed at 05/14/2019 1651  Gross per 24 hour   Intake 1110 ml   Output 2650 ml   Net -1540 ml     General: NAD, well-developed  Eyes: Conjunctiva and lids normal; Pupils equal, Anicteric  ENMT: normal facial anatomy with no traumatic lesions; normal mucosa, no discharge  Neck: supple, trachea midline, no thyromegaly  Respiratory: Normal work of breathing, CTAB  Cardiovascular: regular, no added heart sounds; JVP normal; no edema  Abdomen: soft, NT/ND, no HSM  Vascular: symmetric 2+ pedal pulses; no ulcerations  Skin: warm and dry; normal coloration  Neuro: CN intact; moves all ext  Psych: alert, oriented; normal affect; appropriate judgement and insight    4/9: leaning over in her chair with R shoulder supported. Otherwise stable exam.     LABS  Lab Results   Component Value Date    CREAT 1.79 (H) 05/14/2019    BUN 43 (H) 05/14/2019    NA 139 05/14/2019    K 4.3 05/14/2019    CL 105 05/14/2019    CO2 23 05/14/2019       ASSESSMENT  85 y.o. delightful female with CKD of unknown cause, characterized by slow progression, subnephrotic proteinuria, and HTN. Admitted for dizziness likely due to hyperleukocytosis.     1. CKD stage 4 with proteinuria and intermittent microhematuria, mild bump in creat v fluctuations due to leukostasis. Now returning to baseline   2. 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 Coagulopathy   4. Hyponatremia mild   5. R hydro mild ? Significance. Not likely causing AKI. Could be sequella from prior stone   6. HTN  7. Prior kidney stones (Seen R side ultrasound 2020, per report) - treated with allopurinol for a time now off        RECOMMENDATIONS  Cont meds  Should pain could be elevating her BP. Will cont same meds for now. Will consider additional BP meds titration in a few more days  Check BMP in AM     Elevated risk of recurrent AKI:  - Avoid nephrotoxic injury  - Dose all medications to current eGFR      Clementeen Graham, MD

## 2019-05-16 LAB — CBC
ABSOLUTE NUCLEATED RBC COUNT: 0.95 10*3/uL — ABNORMAL HIGH (ref 0.00–0.00)
HEMATOCRIT: 25.6 — ABNORMAL LOW (ref 34.9–45.2)
PLATELET COUNT, AUTO: 161 10*3/uL (ref 143–398)
PLATELET COUNT, AUTO: 147 10*3/uL (ref 143–398)
WHITE BLOOD CELL COUNT: 93.66 10*3/uL — ABNORMAL HIGH (ref 4.16–9.95)

## 2019-05-16 LAB — APTT
APTT: 44.5 s — ABNORMAL HIGH (ref 24.4–36.2)
APTT: 58.7 s — ABNORMAL HIGH (ref 24.4–36.2)
APTT: 61.9 s — ABNORMAL HIGH (ref 24.4–36.2)

## 2019-05-16 LAB — Phosphorus
PHOSPHORUS: 5.7 mg/dL — ABNORMAL HIGH (ref 2.3–4.4)
PHOSPHORUS: 5.8 mg/dL — ABNORMAL HIGH (ref 2.3–4.4)
PHOSPHORUS: 6.1 mg/dL — ABNORMAL HIGH (ref 2.3–4.4)

## 2019-05-16 LAB — Uric Acid
URIC ACID: 8.8 mg/dL — ABNORMAL HIGH (ref 2.9–7.0)
URIC ACID: 9 mg/dL — ABNORMAL HIGH (ref 2.9–7.0)
URIC ACID: 9.2 mg/dL — ABNORMAL HIGH (ref 2.9–7.0)

## 2019-05-16 LAB — Basic Metabolic Panel
ANION GAP: 11 mmol/L (ref 8–19)
CREATININE: 1.61 mg/dL — ABNORMAL HIGH (ref 0.60–1.30)
GFR ESTIMATE FOR AFRICAN AMERICAN: 31 mL/min/{1.73_m2} (ref 135–146)
SODIUM: 136 mmol/L (ref 135–146)

## 2019-05-16 LAB — Fibrinogen
FIBRINOGEN: 192 mg/dL — ABNORMAL LOW (ref 235–490)
FIBRINOGEN: 194 mg/dL — ABNORMAL LOW (ref 235–490)
FIBRINOGEN: 216 mg/dL — ABNORMAL LOW (ref 235–490)

## 2019-05-16 LAB — D-Dimer
D-DIMER STAGO: 2.51 ug{FEU}/mL — ABNORMAL HIGH (ref <0.60)
D-DIMER STAGO: 2.37 ug{FEU}/mL — ABNORMAL HIGH (ref <0.60)
D-DIMER STAGO: 2.34 ug{FEU}/mL — ABNORMAL HIGH (ref <0.60)

## 2019-05-16 LAB — Prothrombin Time Panel
INR: 1.3 s (ref 11.5–14.4)
PROTHROMBIN TIME: 16.1 s — ABNORMAL HIGH (ref 11.5–14.4)
PROTHROMBIN TIME: 16.8 s — ABNORMAL HIGH (ref 11.5–14.4)

## 2019-05-16 LAB — Calcium,Ionized
IONIZED CA++,CORRECTED: 1.12 mmol/L (ref 1.09–1.29)
IONIZED CA++,UNCORRECTED: 1.17 mmol/L (ref 1.09–1.29)
IONIZED CA++,UNCORRECTED: 1.14 mmol/L (ref 1.09–1.29)

## 2019-05-16 LAB — Differential Automated: ABSOLUTE NEUT COUNT: 42.6 10*3/uL — ABNORMAL HIGH (ref 1.80–6.90)

## 2019-05-16 LAB — COVID-19 PCR: COVID-19 PCR: NOT DETECTED

## 2019-05-16 MED ADMIN — HYDROXYUREA 500 MG PO CAPS: 1000 mg | ORAL | @ 02:00:00 | Stop: 2019-05-17 | NDC 00904693961

## 2019-05-16 MED ADMIN — MELATONIN 3 MG PO TABS: 3 mg | ORAL | @ 06:00:00 | Stop: 2019-05-19

## 2019-05-16 MED ADMIN — DILTIAZEM HCL ER COATED BEADS 120 MG PO CP24: 120 mg | ORAL | @ 20:00:00 | Stop: 2019-06-15 | NDC 60687019501

## 2019-05-16 MED ADMIN — LACTATED RINGERS IV BOLUS: 500 mL | INTRAVENOUS | @ 05:00:00 | Stop: 2019-05-16 | NDC 00338011704

## 2019-05-16 MED ADMIN — EPOETIN ALFA-EPBX 10000 UNIT/ML IJ SOLN: 7300 [IU] | SUBCUTANEOUS | @ 02:00:00 | Stop: 2019-05-16

## 2019-05-16 MED ADMIN — ASPIRIN 81 MG PO CHEW: 81 mg | ORAL | @ 16:00:00 | Stop: 2019-05-19 | NDC 66553000201

## 2019-05-16 MED ADMIN — PANTOPRAZOLE SODIUM 40 MG PO TBEC: 40 mg | ORAL | @ 16:00:00 | Stop: 2019-05-19 | NDC 50268063915

## 2019-05-16 MED ADMIN — LISINOPRIL 20 MG PO TABS: 20 mg | ORAL | @ 16:00:00 | Stop: 2019-05-16 | NDC 00904679961

## 2019-05-16 MED ADMIN — HYDROXYUREA 500 MG PO CAPS: 1000 mg | ORAL | @ 13:00:00 | Stop: 2019-05-17 | NDC 00904693961

## 2019-05-16 MED ADMIN — LEVOTHYROXINE SODIUM 50 MCG PO TABS: 50 ug | ORAL | @ 14:00:00 | Stop: 2019-05-19 | NDC 51079044020

## 2019-05-16 MED ADMIN — LIDOCAINE 5 % EX PTCH: 1 | TRANSDERMAL | @ 16:00:00 | Stop: 2019-05-19 | NDC 00591352530

## 2019-05-16 MED ADMIN — EPOETIN ALFA-EPBX 10000 UNIT/ML IJ SOLN: 7300 [IU] | SUBCUTANEOUS | @ 05:00:00 | Stop: 2019-05-19 | NDC 00069130810

## 2019-05-16 MED ADMIN — ACETAMINOPHEN 325 MG PO TABS: 650 mg | ORAL | @ 07:00:00 | Stop: 2019-05-19

## 2019-05-16 MED ADMIN — LACTATED RINGERS IV SOLN: 75 mL/h | INTRAVENOUS | @ 13:00:00 | Stop: 2019-05-17 | NDC 00338011704

## 2019-05-16 MED ADMIN — LIDOCAINE 5 % EX PTCH: 1 | TRANSDERMAL | @ 06:00:00 | Stop: 2019-05-19

## 2019-05-16 MED ADMIN — HEPARIN SODIUM (PORCINE) 5000 UNIT/ML IJ SOLN: 5000 [IU] | SUBCUTANEOUS | @ 05:00:00 | Stop: 2019-05-19 | NDC 00641040012

## 2019-05-16 MED ADMIN — MAGNESIUM OXIDE 400 (240-241.3 MG) MG (MULTI-GPI) PO TABS: 400 mg | ORAL | @ 16:00:00 | Stop: 2019-05-19 | NDC 64980033901

## 2019-05-16 MED ADMIN — ROSUVASTATIN CALCIUM 20 MG PO TABS: 20 mg | ORAL | @ 05:00:00 | Stop: 2019-05-19 | NDC 60687024501

## 2019-05-16 MED ADMIN — ALLOPURINOL 50 MG PO TABS: 50 mg | ORAL | @ 18:00:00 | Stop: 2019-05-17 | NDC 00603211521

## 2019-05-16 MED ADMIN — HEPARIN SODIUM (PORCINE) 5000 UNIT/ML IJ SOLN: 5000 [IU] | SUBCUTANEOUS | @ 16:00:00 | Stop: 2019-05-19 | NDC 00641040012

## 2019-05-16 MED ADMIN — ACETAMINOPHEN 325 MG PO TABS: 650 mg | ORAL | @ 18:00:00 | Stop: 2019-05-19 | NDC 50580045811

## 2019-05-16 MED ADMIN — GABAPENTIN 100 MG PO CAPS: 100 mg | ORAL | @ 05:00:00 | Stop: 2019-05-19 | NDC 60687058001

## 2019-05-16 MED ADMIN — ROSUVASTATIN CALCIUM 10 MG PO TABS: 20 mg | ORAL | @ 05:00:00 | Stop: 2019-06-10 | NDC 60687024501

## 2019-05-16 MED ADMIN — ACETAMINOPHEN 325 MG PO TABS: 650 mg | ORAL | @ 01:00:00 | Stop: 2019-05-19 | NDC 50580045811

## 2019-05-16 MED ADMIN — ACETAMINOPHEN 325 MG PO TABS: 650 mg | ORAL | @ 09:00:00 | Stop: 2019-05-19 | NDC 50580045811

## 2019-05-16 MED ADMIN — GUAIFENESIN 100 MG/5ML PO SOLN: 100 mg | ORAL | @ 08:00:00 | Stop: 2019-05-19 | NDC 00121148800

## 2019-05-16 MED ADMIN — FUROSEMIDE 10 MG/ML IJ SOLN: 20 mg | INTRAVENOUS | @ 06:00:00 | Stop: 2019-05-16 | NDC 00409610202

## 2019-05-16 NOTE — Nursing Note
1915: Patient in bed on RA non-tele A&Ox4 BP 175/82  ~ Pulse 97  ~ Temp 36.2 C (97.2 F) (Temporal)  ~ Resp 18  ~ Ht 1.549 m (5' 1'')  ~ Wt 48.8 kg (107 lb 8 oz)  ~ SpO2 94%  ~ BMI 20.31 kg/m '  No c/o pain  LR at 75 ml/hr  Daughter at bedside expressed she does not want student phlebotomist drawing patients blood.  Fall precautions in place, call light within reach.

## 2019-05-16 NOTE — Other
Patients Clinical Goal:   Clinical Goal(s) for the Shift: vss, comfort, safety  Identify possible barriers to advancing the care plan: None  Stability of the patient: Moderately Stable - low risk of patient condition declining or worsening   End of Shift Summary:    A/ox4, makes needs known.   On RA, SPO2 in the high 90s. Denies shortness of breath.   BP stable. Denies chest pain. On LR at 75cc/hr.   Good PO intake. BM x1 today.   Continent of urine. Adequate urine output.   BMAT 3. Primafit while in bed.   Skin is intact. Ambulated down the hallway x3 today without any sob. DME junior walker delivered at bedside.   WBC down trending at 65.02. On Tylenol for pain control.    Chemo precautions continued. Safety maintained throughout shift.   BP 166/68  ~ Pulse 94  ~ Temp 36.5 C (97.7 F) (Temporal)  ~ Resp 16  ~ Ht 1.549 m (5' 1'')  ~ Wt 47.7 kg (105 lb 1.6 oz)  ~ SpO2 95%  ~ BMI 19.86 kg/m

## 2019-05-16 NOTE — Nursing Note
0800 A/ox4, states concerns. Denies pain. VSS. Am meds administered. Call light within reach.    0930 Ambulated down the hallway with PT. Denies sob.     1200 Up to chair. Daughter at bedside.     1400 IV saline locked for ambulation. Patient denies pain. No further needs.    1700 Patient ambulated around the hallway without any complains of shortness of breath.     1800 Daughter Kim at bedside and questions clarified. Evening meds given including hydroxyurea with 2nd RN verified. Bed alarm on, all needs met.

## 2019-05-16 NOTE — Other
Patients Clinical Goal:   Clinical Goal(s) for the Shift: vss, comfort, safety  Identify possible barriers to advancing the care plan:   Stability of the patient: Moderately Unstable - medium risk of patient condition declining or worsening    End of Shift Summary: Patient A&Ox4 on RA non-tele BP 167/77  ~ Pulse 94  ~ Temp 36.2 ?C (97.1 ?F) (Temporal)  ~ Resp 16  ~ Ht 1.549 m (5' 1'')  ~ Wt 47.7 kg (105 lb 1.6 oz)  ~ SpO2 93%  ~ BMI 19.86 kg/m?   Patient given LR bolus and lasix IVP  IVF continued LR at 75 ml/hr   Patient given guaifenesin for cough  Patient slept intermittently   Fall precautions in place, call light within reach.     GERIATRICS END OF SHIFT - DISCHARGE MARKERS of Instability Checklist  Nursing assessment:     Indicator   Cutoff Check if indicator needs to be addressed (meets cutoff)   Situation/Action/Comment   O2 requirement New since admission []     PO intake Less than 50% []     Urination None in past 8 hours/last shift []     Foley New since admission []     Pain Present  []     Bowel movements  <1 in 2 days or >3 in 24 hours []     Delirium Unable to follow simple commands or participate with PT/OT []     Mobility Unable to stand and/or walk to bathroom []  OOB to chair          BOOST / SAFE TRANSITION - DISCHARGE INDICATORS    Indicator Check if indicator has red ''P''    Situation/Action/Comment     Problems with Medications  []     Psychological  []     Principal Diagnosis  []     Physical Limitations  []     Poor Health Literacy  []     Patient Support  []     Prior Hospitalizations  [x]     Palliative Care  []       DELIRIUM PREVENTION  Protocols Strategies Check if implemented Comments   Risk factors >3 present- High risk []     Purposeful orientation Reorient, purposeful orienting conversation  Familiar objects in room []   []     Therapeutic activities Volunteer visit  Cognitive stimulation activities: games, reading, music []   []     Vision & hearing Assistance with: Leisure centre manager  Assistance with: hearing aids/hearing amplifier []   []     Feeding & hydration Assistance with feeding  Assistance with dentures []   []     Sleep hygiene Shades/blinds/lights on during day, limit naps during day  Quiet environment, consolidate care []     Mobilization BMAT 3-4: Ambulate TID  BMAT 2: OOB daily to chair for meals ? 2 hours, each time  BMAT 1: OOB to cardiac chair daily for meals ? 2 hours, each time []   []   []     Pain Non-narcotics ATC  Non-pharmacological: oil/aromatherapy, massage, music []   []     Maintain safety Fall precautions, volunteer visit, family at bedside, tele sitter, constant observer []     Manage agitation Redirect with calm, gentle voice and avoid confrontation  Avoid restraints and use alternative to restraints  Doll, music or animal therapy, as appropriate  Volunteer for companionship if safe and appropriate []   []   []   []       DELIRIUM: CAM (+)  Protocols Strategies Check if implemented Comments   New-onset MD contacted  Delirium order-set initiated  Bladder scan to  R/O retention  Assess stool impaction  Medication reviewed with pharmacist []   []   []   []   []  MD Name:   Existing Manage and prevent further delirium []

## 2019-05-16 NOTE — Other
Patients Clinical Goal:   Clinical Goal(s) for the Shift: VSS, comfort, pain management, safety, rest, sleep  Identify possible barriers to advancing the care plan:  Stability of the patient:   End of Shift Summary:  A/O x4, VSS, pain L shoulder relieved with Tylenol 650 mg po ATC. Over 50% meals completed, last BM today x1, large brown and formed. Ambulated with FWW down hall  BOOST / SAFE TRANSITION - DISCHARGE INDICATORS    Indicator Check if indicator has red ''P''    Situation/Action/Comment     Problems with Medications  []     Psychological  []     Principal Diagnosis  []     Physical Limitations  [x]     Health Literacy  []     Patient Support  []     Prior Hospitalizations  []     Palliative Care  []       DELIRIUM PREVENTION  Protocols Strategies Check if implemented Comments   Risk factors >3 present- High risk []     Purposeful orientation Reorient, purposeful orienting conversation  Familiar objects in room []   []     Therapeutic activities Volunteer visit  Cognitive stimulation activities: games, reading, music []   []     Vision & hearing Assistance with: eyeglasses/vision aid  Assistance with: hearing aids/hearing amplifier []   []     Feeding & hydration Assistance with feeding  Assitance with dentures []   []     Sleep hygiene Shades/blinds/lights on during day, limit naps during day  Quiet environment, consolidate care []     Mobilization BMAT 3-4: Ambulate TID  BMAT 2: OOB daily to chair for meals ? 2 hours, each time  BMAT 1: OOB to cardiac chair daily for meals ? 2 hours, each time []   []   []     Pain Non-narcotics ATC  Non-pharmacological: oil/aromatherapy, massage, music []   []     Maintain safety Fall precautions, volunteer visit, family at bedside, tele sitter, constant observer []     Manage agitation Redirect with calm, gentle voice and avoid confrontation  Avoid restraints and use alternative to restraints  Doll, music or animal therapy, as appropriate  Volunteer for companionship if safe and appropriate []  []   []   []       DELIRIUM: CAM (+)  Protocols Strategies Check if implemented Comments   New-onset MD contacted  Delirium order-set initiated  Bladder scan to R/O retention  Assess stool impaction  Medication reviewed with pharmacist []   []   []   []   []  MD Name:   Existing Manage and prevent further delirium [] 

## 2019-05-16 NOTE — Progress Notes
DAILY GERIATRIC PROGRESS NOTE    DATE OF SERVICE: 05/16/2019  HOSPITAL DAY: 6  CHIEF COMPLAINT: No chief complaint on file.    ID: [id number] with history of CKD 4, HTN, and CMML presenting with lightheadedness and SOB, found to have hyperleukocytosis (WBC >100k), for which she is admitted.    INTERVAL EVENTS:  - WBC improving after increased dose of hydroxyurea (152k --> 65k; monocytes 60k --> 11k)  - started on epo per heme/onc recs given down-trending Hb (<8.0)  - TLS labs stable/improving  --> uric acid 12.8 --> 7.7 --> 9.2  --> phos increased (5.8)  ----> given 500cc IVF and 20mg  IV Lasix (net negative >1L)  - Cr back to baseline (~1.8) after starting lisinopril  - BP still high (SBP 160s) --> starting diltiazem  - cough after starting lisinopril --> changing to losartan  - DIC labs stable/improving  --> INR 1.4    SUBJECTIVE:  - cough overnight that responded well to guaifenesin, sleep otherwise disturbed by anxiety/ruminating thoughts (which come and go, unresponsive to melatonin)  - on RA overnight  - Otherwise no new cardiac, respiratory, or GI symptoms.    MEDICATIONS:  Scheduled:  acetaminophen, 650 mg, Oral, Q6H  allopurinol, 50 mg, Oral, Daily  aspirin, 81 mg, Oral, Daily  dilTIAZem, 120 mg, Oral, Daily  epoetin alfa-epbx, 150 Units/kg, Subcutaneous, Once per day on Tue Thu Sat  ferrous sulfate, 325 mg, Oral, Every Other Day  gabapentin, 100 mg, Oral, QHS  heparin, 5,000 Units, Subcutaneous, Q12H  hydroxyurea, 1,000 mg, Oral, BID  levothyroxine, 50 mcg, Oral, Every Other Day  levothyroxine, 75 mcg, Oral, Every Other Day  lidocaine, 1 patch, Transdermal, Q24H  [START ON 05/17/2019] losartan, 50 mg, Oral, Daily  magnesium oxide, 400 mg, Oral, Daily  pantoprazole, 40 mg, Oral, Daily  rosuvastatin, 20 mg, Oral, QHS  PRN:  analgesic balm, bisacodyl, diclofenac Sodium, guaiFENesin, hydrALAZINE, lidocaine PF, magnesium hydroxide, melatonin, senna  Infusions:  ? lactated ringers 75 mL/hr (05/16/19 0551)       PHYSICAL EXAM:  Temp:  [36.2 ?C (97.1 ?F)-36.3 ?C (97.4 ?F)] 36.3 ?C (97.3 ?F)  Heart Rate:  [92-98] 98  Resp:  [16-18] 18  BP: (129-175)/(65-82) 129/81  NBP Mean:  [97-113] 97  SpO2:  [92 %-95 %] 92 %  I/O: I/O last 2 completed shifts:  In: 2385 [P.O.:460; I.V.:1425; IV Piggyback:500]  Out: 3550 [Urine:3550]   +600cc    General:   Well-appearing in NAD   Eyes:   PERRL, EOM grossly intact, sclerae and conjunctivae anicteric and non-injected bilaterally with no pallor or exudates.   Ears:   Hearing grossly intact   Nose/Mouth:  MMM without exudates, ulcers, or petechiae.   Neck:  Normal range of motion. No JVD   Lymph:   No anterior/posterior cervical or submandibular LAD.   Lungs:   No increased WOB, no dullness to percussion, good air movement bilaterally, less crackles at L lung base than on prior exams, otherwise CTAB with no RWR.   Chest/Heart:   RRR, normal S1 S2, crescendo-decrescendo 2/6 systolic murmur heard best at 2nd R intercostal space, otherwise no MGR.    Abdomen:   Non-distended. No tenderness or masses on palpation, no rebound or guarding. +Splenomegaly   GU:   Not indicated.   Skin/integument:  No rashes, lesions, or breakdown. Fingernails with normal contour, color, and lunulae.   Extremities:   No cyanosis, clubbing, or edema. TTP along L trapezius improved from prior exam. Preserved ROM of L  shoulder. Negative empty can test.   Neuro:   Alert and interactive, cranial nerves II-XII grossly intact, ambulates with shuffling gait without assistance       CAM DELIRIUM TEST:  Feature One:   A. Acute Change in mental status from baseline? []  yes [x]  no   B. Is there fluctuation in the mental status? []  yes [x]  no    Feature Two:  A. Does that patient have difficulty focusing attention?  []  yes [x]  no   (days of week, serial 7s)    Feature Three:  A.Is there evidence of disorganized thinking?  []  yes [x]  no    Feature Four:  A. Does the patient have an altered level of consciousness (ie: vigilant, lethargic, stuporous, comatous?  []  yes [x]  no    The diagnosis of delirium by CAM requires the presence of features 1 and 2 and either 3 or 4      DATA:  I have reviewed the following information from the last 24 hours: allied health and treating physician notes, imaging, labs and microbiology data and cardiac studies and telemetry data:    Lab Results   Component Value Date    WBC 65.02 (H) 05/16/2019    HGB 7.8 (L) 05/16/2019    HCT 25.4 (L) 05/16/2019    PLT 147 05/16/2019     Lab Results   Component Value Date    NA 137 05/16/2019    K 4.2 05/16/2019    CL 104 05/16/2019    CO2 22 05/16/2019    BUN 46 (H) 05/16/2019    CREAT 1.74 (H) 05/16/2019    GLUCOSE 87 05/16/2019    CALCIUM 9.2 05/16/2019    MG 1.3 (L) 05/11/2019    PHOS 5.8 (H) 05/16/2019     Lab Results   Component Value Date    APTT 58.7 (H) 05/16/2019    PT 16.8 (H) 05/16/2019    INR 1.4 05/16/2019     Lab Results   Component Value Date    ALT 16 05/14/2019    AST 37 05/14/2019    BILITOT 0.4 05/14/2019    ALKPHOS 202 (H) 05/14/2019    ALBUMIN 4.0 05/14/2019     Lab Results   Component Value Date    TSH 5.5 (H) 05/10/2019     Lab Results   Component Value Date    CHOL 77 (L) 01/18/2019    CHOLHDL 19 (L) 01/18/2019    TRIGLY 165 (H) 01/18/2019       MICRO:  Covid-19 PCR negative, 05/10/19    IMAGING:     TTE, 05/13/19:    CONCLUSIONS   1. Technically difficult study.   2. Normal left ventricular size.   3. Mild concentric left ventricular hypertrophy.   4. Left ventricular ejection fraction is approximately 60 to 65%.   5. Abnormal LV diastolic function (Grade I).   6. Moderate aortic valve stenosis with an aortic valve area of 1.15 cm? (index 0.67 cm?/m?).   7. Mild mitral valve regurgitation.   8. The peak TR velocity is not well defined, and therefore, pulmonary artery systolic pressure cannot be calculated. Based on the acceleration time in the RV outflow tract, the PA pressure is likely to be elevated.   9. There are no prior studies on this patient for comparison purposes.    L Shoulder x-ray, 05/13/19:  IMPRESSION:  1. No bone lesions.  2. Mild glenohumeral osteoarthritis. Anterior acromial spur, mild acromiohumeral narrowing and spur formation on the  greater tuberosity suggest chronic rotator cuff disease.    Renal ultrasound, 05/11/19:  IMPRESSION:  ?  Mild right hydronephrosis, unchanged since prior.  Left pelviectasis.    Abdominal ultrasound, 05/11/19:  IMPRESSION:  ?  1. Marked splenomegaly.  Mild hepatomegaly.  ?  2. Borderline right hydronephrosis.    CXR, 05/10/19:  IMPRESSION:  ?  Stable cardiomediastinal silhouette with mildly tortuous, calcified thoracic aorta. Persistent mild cardiomegaly.  Normal lung volumes. Increased density of the bilateral lower lung zones is accentuated due to overlying breast implants.  There are bilateral lower lobe patchy consolidations, most suggestive of pulmonary edema versus pneumonia.   No right pleural effusion. Trace left pleural effusion. No pneumothorax. Elevation of the left hemidiaphragm  No acute osseous abnormalities.  ?  Pertinent pathology:  [place] Name: Lindsay Brennan, Lindsay Brennan Accession#: [id number] Med. Rec. # [id number] Taken: 10/30/2016 Encounter #: [id number] Received: 10/30/2016 Gender: F Reported: 11/04/2016 16:53 DOB: 1934-07-05 (Age: 17)  Submitting Physician: Hubert Azure D M.D.  Additional Physician(s):  [facility]  Clinical Information:  85 year old with remota history of mild pancytopenia in 2008. There ara no recent history or CBC studies.  Specimen Received:  A: Bone marrow,aspirate for clot section, pic,cyto sent to iu B: Bone marrow, biopsy (decalelfied),Ipic  ?  Final  Pathologic Diagnosis:  id B. Bone marrow, bio and aspirate for clot section, Jeft posterioriliac crest: Myeloid neoplasm. See note.  As the senior physician, ~ attest that I: (i) examined the relevant preparation(s) for the specimen(s); and (ii) rendered or confirmed the diagnosis(es).  ?Electronically Signed ngz1/10/30/2016 Jobie Quaker, M.D. (Pathologist)  Deanne Coffer, M.D. (Resident) Signing Location: [facility], [address], [address], [place], [place] [address]  Note:  The majordifferential diagnosis includes chronic myelomonocytic leukemia. Clinical correlation and correlation with cytogenetics/FISH study including BCR-ABL7 fusion study is recommended for further classification.  Dr. 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  ?  Cellularity: Markedly hypercellular (> 90%) Myslopoiesis: Markedly increased with full maturation Erythropoiesis: Decreased Megakaryopoiesis: Markedly increased with frequent hypolobated/monolobated megakaryocytes including small hyperchromatic forms Additional studles: Reticulin stain shows mild increasein reticulin fibrosis (1+) with appropriate control,  Theimmunohistechemical stains are performedin this tase and same of the antigens may also ba evaluated by flow cytometry, Concument immunchistechemieal stains on tissue sections are Indicated in order to correlate immunophenotype with cell morphology, evaluate spatial dletribution/architectural features and/or for a quantitation of disease involvement  Bone marrow,aspirate smear:  Sample quality: Adequate  ME ratio: Markedly increased, ~ 8:1 Blasts: Not increased (4%) ' Myelopoiesis: Markedly increased with occasional hypolobated neutrophils Erythropoiesis: Decreased with megaloblastoid changes Megakaryopoiesis: Markedly increased with frequent hypolobated/monolobated megakaryocytes Comments: No clusters of blasts, no Auer rods. Additional studies: Iron stain performed at the [facility] showsincreased storage iron with no ring sideroblasts. Control stains appropriately,  Bone marrow differential:  Percent  Blasts 2  Promyeltocytes 7  Myelocytes 23  Metamyelocytes 6  Bands 16  Polys 30  Lymphocytes 3  Monocytes 2  Eosinophils 4  Basophils 1  Nucleated RBCs 10  Peripheral blood, smear:  CBC: WBC 46 k/cumm, hemoglobIn 10,7 g/dL, MCV 81 fL, ROW 84.6%, platelet count 316 k/cumm Differential: Neutrophils 57%, lymphocytes 5%, monocytes 21%, myelocytas 3%, Metamyelocytes 8%, band 6%  RBCs: Normocytic anaemia with anlsopgikilocytosis. WEBCs: Leucocytosis, granulocytosis with left-shift, monocytosis. Platelets: Normal platelet caunt with occasional giantplatelets  ?  Results-Comments  CD34 and p53 stains show rare CD34+ blasts (<5%) and exceedingly rare p53+ cells. Controls  stain appropriately.  ?  INTERERETATION: (600 cell diff)  ?  Bone Marrow Aspirate specimen is adequate and is markedly hypercellular. Spicules are present.  Megakaryocytes Are increaged in numberwith occasional small mono/hypolobated forms and rare small separated lobe forms identified. My elopoiesis Is abnormal, increased, and proceeds to completion. Blasts and promonocytes comprise 6% of total nucleated cells. 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 The M/E ratio is 8.871.  Erythroid series Shows occasional megaloblastoid changes.  Lymphoid Lymphocytes comprise 2% of total nucleated cells,  Comments: Correlate with bone marrow biopsy and cytogenetic studies,  Tron Stain Results: +2/6 Iron in reticulum, no ringed formsidentified.  Control: positive.  IMPRESSION: DATESIGNED OUT: 11/01/2016 TIME: 10:10 CMML-1.  COMMENT: The marrow biopsy was not available at the time of finalizing the aspirate report. In the event of a marrow biopsy report with discrepant results an addendum will be provided by [facility].  ?  My blast count was 8% ( 300 cell differential) with 15% monocytes; trilineage dysplasia; increased numbers of megakaryocytes and 90-100% cellular particles.  Becauseofthe high WBC I did my counting very close to the particles to void any dilution effect from the peripheral blood.  1% blasts in PBS and 19% monocytes.  JMB ASSESSMENT/PLAN:   Lindsay Brennan is a 85 y.o. 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  ?  #Symptomatic Hyperleukocytosis   #History of chronic myelomonocytic leukemia type-1  Patient's lightheadedness and SOB may be secondary to WBC >100k, though other than elevated Cr no e/o end-organ damage. Blasts <5% (4% on admission) making blast crisis less likely, though patient declining bone marrow bx to determine extent of blast cells in bone marrow. Patient has not received treatment previously, and notably CMML is not driven by BCR-ABL mutation. Evaluated by hematology/oncology who recommend hydroxyurea (dose-reduced for eGFR <60); if no improvement in WBC or worsening in symptoms will consider leukapheresis. Toxicities of hypo-methylating agents (treatment of choice in MDS/CMML) likely outweigh benefits in this 85yo patient, and regardless patient hopes to return to [place] for consideration of more aggressive treatment once she is stabilized. 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 Of note, no e/o of infection to explain marked increase in WBC from prior baseline (previously 45k in October 2020), though will have low threshold to start antibiotics, noting PCN allergy (hives). Elevated procalcitonin (1.12) difficult to interpret in setting of high circulating cytokines from heme malignancy. Leukocytosis improving on increased dose of hydroxyurea (1g BID); now ~65k down from 150k (monocyte count 60k --> 11k).  []  heme/onc consulted, appreciate recs:  - hydroxyurea started 4/6 --> increased to 1g BID on 4/9 given persistently elevated WBC count (>100k) and improving GFR.  --> if symptoms worsen, will consider leukapheresis   -Trend TLS labs (uric acid, phos, K, iCa) q12h  -Trend DIC labs (INR, PTT, fibrinogen, D-dimer, platelets) q12h  --> maintain platelets >30lk  --> maintain fibrinogen >100  -Aggressive hydration (1.5cc/kg per hour --> 75cc/h in this patient)  --> IV Lasix 20mg  prn if worsening oxygenation or high positive fluid balance on strict I/Os  -Follow-up hematology malignancy panel  -Follow-up peripheral blood flow cytometry  []  f/u with  Dr. Lennon Alstrom with CBC beforehand (prior to transitioning back to care under Dr. Silvio Clayman in [place]).    #Acute hypoxic respiratory failure:  #HFpEF, new diagnosis:  BNP elevated on admission (532) with TTE on 05/13/19 showing grade I diastolic dysfunction. Hypoxia on presentation likely 2/2 volume overload given improvement with IV Lasix.   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 Suspect hypervolemia due to renal insufficiency. BNP mildly elevated. No acid/base disorder noted on 4/5 VBG. UA without significant pyuria. L pelviectasis on renal ultrasound but per renal this is likely sequelae of prior nephrolithiasis. UMA 377mg /g.  []  renal consulted, appreciate recs:  - lisinopril restarted 4/8 given Cr stable and BP persistently elevated, for long-term benefit in CKD --> change to losartan as below  - trend Cr daily  - allopurinol 50mg     #Lightheadness, POA, improving:  #Moderate Aortic Stenosis, new diagnosis:  Symptoms likely due to hyperluekocytosis, though query component of medication adverse effect (clonidine and diltiazem) as well as deconditioning. TTE on 05/13/19 showing moderate AS, though unlikely to be cause of symptoms.  []  decrease home diltiazem as below and discontinue clonidine 0.1mg  TID  []  fall precautions --> junior walker ordered per PT recs  []  PT/OT following  []  repeat TTE April 2022 to assess for progression of AS  ?  #Elevated INR, POA, improving:  INR 1.9 on admission. This raises c/f synthetic liver dysfunction due to leukostasis or DIC, which can occur in up to 40% of patients with hyperleukocytosis. Per heme/onc, high INR likely due to factor X deficiency from factor X binding to atypical monocytes. Reassuringly, other DIC labs WNL (other than D-dimer), and other liver labs WNL (including albumin at 3.5). Consider nutritional deficiency given recent weigh loss, or consumptive coagulopathy given splenomegaly. INR now improving.  []  CTM, will consider vit K administration if increasing INR, after discussion with heme/onc  ?  #Anemia, POA, worsening: Hgb 8.9 on admission --> 7.7 on 4/10. Chronic anemia likely 2/2 renal disease and CMML. Iron studies not c/w iron deficiency anemia (ferritin 640, 28% transferrin saturation). Will give epo given Hb <8.0 (noting worsening anemia as possible adverse effect of hydroxyurea).  - trend CBC daily  - continue home iron supplementation (given CKD and on epo)  ?  #Cough, new, not POA:  May be secondary to lisinopril given onset concomitant with initiation of that medication. May also be secondary to pulmonary edema though lungs CTAB with ongoing cough.  []  discontinue lisinopril (change to losartan)  []  guaifenesin prn    #Hypertension:   Hold home diltiazem 180mg  and clonidine 0.1mg  TID given possibly contributing to lightheadedness and dizziness. Will spot dose with hydralazine and if BP persistently >160 will consider standing amlodipine.  []  change lisinopril 20mg  to losartan 50mg  given new cough  []  restart home diltiazem at lower dose (120mg  q24h)  - hydral 25mg  PO q6h prn for SBP >180 and/or DBP >110    #L shoulder pain, chronic but worsening:  X-ray suggestive of chronic rotator cuff disease, however exam also with TTP along L trapezius suggestive of myofascial pain syndrome.   []  [facility] consulted, now s/p trigger point injection on 4/8 with improvement in symptoms  - diclofenac 1% gel prn  - lidocaine patch prn  - acetaminophen RTC    Chronic/Stable Medical Problems  #Subclinical hypothyrodism: TSH 5.2 on  admission  -Continue levothyroxine , on alternating days   ?  #HLD:   -Continue rosuvastatin?20mg   ?  #Peripheral neuropathy, POA:  -Continue gabapentin but dose reduce to 100mg  at bedtime (from 200mg ) given worsening kidney function and dizziness  ?  Inpatient Checklist  Diet: renal (low K, low phos)  GI ppx: home PPI  DVT ppx: heparin subq  Lines: pIV  Tubes/Drains: none  ?  CODE STATUS: Full Code  Primary Emergency Contact: Margot Ables [phone])  ?  Disposition:  TBD (pending PT/OT eval)  ?  Ambulatory Status & Fall Risk  [] ? non-ambulatory  [] ? walks with cane and/or walker  [x] ? walks independently  [] ? walks independently but may benefit from an assistive device evaluation with physical therapy  [] ? needs supervision with walking secondary to poor safety awareness  ?  Discharge Considerations:  [x] ? recommend PT evaluation to see if appropriate for skilled nursing facility   [x] ? recommend increase level of care/supervision then patient currently has at home  [] ? recommend 24 hour supervision  [] ? recommend home health services  [] ? after stabilization patient may return to prior living situation as it is adequate to meet his/her needs  [] ? recommend new assistive device for ambulation, safety, and fall risk  [x] ? will re-evaluate later in the hospital course once further stabilized    This patient has an advanced directive:  Yes []   No [x]  Unknown []     This patient's designated decision maker if or when patient lacks capacity is: Daughter Kim  Code Status (designated by patient): Full Code    Discussed with attending, Dr. Alba Cory    Signed,  Henderson Cloud, MD/MBA  Internal Medicine, PGY-3  [phone]

## 2019-05-17 LAB — APTT: APTT: 61.1 s — ABNORMAL HIGH (ref 24.4–36.2)

## 2019-05-17 LAB — APTT Immediate/Incubated Mixing Study: APTT PATIENT PLASMA + NORMAL PLASMA IMMEDIATE: 34.8 s (ref 24.4–36.2)

## 2019-05-17 LAB — Phosphorus: PHOSPHORUS: 5.6 mg/dL — ABNORMAL HIGH (ref 2.3–4.4)

## 2019-05-17 LAB — CBC
ABSOLUTE NUCLEATED RBC COUNT: 0.29 10*3/uL — ABNORMAL HIGH (ref 0.00–0.00)
MEAN CORPUSCULAR VOLUME: 91.7 fL (ref 79.3–98.6)

## 2019-05-17 LAB — Fibrinogen: FIBRINOGEN: 209 mg/dL — ABNORMAL LOW (ref 235–490)

## 2019-05-17 LAB — D-Dimer: D-DIMER STAGO: 2.14 ug{FEU}/mL — ABNORMAL HIGH (ref <0.60)

## 2019-05-17 LAB — Differential Automated: MONOCYTE PERCENT, AUTO: 14.5 (ref 0.20–0.80)

## 2019-05-17 LAB — Lactate Dehydrogenase: LACTATE DEHYDROGENASE: 961 U/L — ABNORMAL HIGH (ref 125–256)

## 2019-05-17 LAB — Basic Metabolic Panel
ANION GAP: 12 mmol/L (ref 8–19)
UREA NITROGEN: 46 mg/dL — ABNORMAL HIGH (ref 7–22)

## 2019-05-17 LAB — Prothrombin Time Panel: PROTHROMBIN TIME: 15.7 s — ABNORMAL HIGH (ref 11.5–14.4)

## 2019-05-17 LAB — Uric Acid: URIC ACID: 10.2 mg/dL — ABNORMAL HIGH (ref 2.9–7.0)

## 2019-05-17 LAB — Hematologic Malignancy Sequencing Panel

## 2019-05-17 LAB — Calcium,Ionized: IONIZED CA++,CORRECTED: 1.21 mmol/L (ref 1.09–1.29)

## 2019-05-17 MED ADMIN — HYDROXYUREA 500 MG PO CAPS: 1000 mg | ORAL | @ 13:00:00 | Stop: 2019-05-17 | NDC 00904693961

## 2019-05-17 MED ADMIN — DILTIAZEM HCL ER COATED BEADS 120 MG PO CP24: 120 mg | ORAL | @ 15:00:00 | Stop: 2019-05-19 | NDC 60687019501

## 2019-05-17 MED ADMIN — ROSUVASTATIN CALCIUM 10 MG PO TABS: 20 mg | ORAL | @ 03:00:00 | Stop: 2019-06-10 | NDC 60687024501

## 2019-05-17 MED ADMIN — LACTATED RINGERS IV SOLN: 75 mL/h | INTRAVENOUS | @ 03:00:00 | Stop: 2019-05-17 | NDC 00338011704

## 2019-05-17 MED ADMIN — GUAIFENESIN 100 MG/5ML PO SOLN: 100 mg | ORAL | @ 03:00:00 | Stop: 2019-05-19 | NDC 00121148800

## 2019-05-17 MED ADMIN — ROSUVASTATIN CALCIUM 20 MG PO TABS: 20 mg | ORAL | @ 03:00:00 | Stop: 2019-05-19 | NDC 60687024501

## 2019-05-17 MED ADMIN — ACETAMINOPHEN 325 MG PO TABS: 650 mg | ORAL | @ 19:00:00 | Stop: 2019-05-19 | NDC 50580045811

## 2019-05-17 MED ADMIN — MELATONIN 3 MG PO TABS: 3 mg | ORAL | @ 06:00:00 | Stop: 2019-05-19

## 2019-05-17 MED ADMIN — MAGNESIUM OXIDE 400 (240-241.3 MG) MG (MULTI-GPI) PO TABS: 400 mg | ORAL | @ 15:00:00 | Stop: 2019-06-09 | NDC 64980033901

## 2019-05-17 MED ADMIN — LACTATED RINGERS IV SOLN: 100 mL/h | INTRAVENOUS | @ 19:00:00 | Stop: 2019-05-18 | NDC 00338011704

## 2019-05-17 MED ADMIN — HEPARIN SODIUM (PORCINE) 5000 UNIT/ML IJ SOLN: 5000 [IU] | SUBCUTANEOUS | @ 15:00:00 | Stop: 2019-05-22 | NDC 00641040012

## 2019-05-17 MED ADMIN — HYDROXYUREA 500 MG PO CAPS: 1000 mg | ORAL | @ 01:00:00 | Stop: 2019-05-17 | NDC 00904693961

## 2019-05-17 MED ADMIN — LOSARTAN POTASSIUM 50 MG PO TABS: 50 mg | ORAL | @ 15:00:00 | Stop: 2019-05-19 | NDC 68084034701

## 2019-05-17 MED ADMIN — LACTATED RINGERS IV SOLN: 75 mL/h | INTRAVENOUS | @ 15:00:00 | Stop: 2019-05-17 | NDC 00338011704

## 2019-05-17 MED ADMIN — PANTOPRAZOLE SODIUM 40 MG PO TBEC: 40 mg | ORAL | @ 15:00:00 | Stop: 2019-05-19 | NDC 50268063915

## 2019-05-17 MED ADMIN — FERROUS SULFATE 325 (65 FE) MG PO TBEC: 325 mg | ORAL | @ 15:00:00 | Stop: 2019-05-19 | NDC 00245010801

## 2019-05-17 MED ADMIN — ASPIRIN 81 MG PO CHEW: 81 mg | ORAL | @ 15:00:00 | Stop: 2019-05-19 | NDC 66553000201

## 2019-05-17 MED ADMIN — GABAPENTIN 100 MG PO CAPS: 100 mg | ORAL | @ 03:00:00 | Stop: 2019-05-19 | NDC 60687058001

## 2019-05-17 MED ADMIN — ACETAMINOPHEN 325 MG PO TABS: 650 mg | ORAL | @ 13:00:00 | Stop: 2019-05-19 | NDC 50580045811

## 2019-05-17 MED ADMIN — LIDOCAINE 5 % EX PTCH: 1 | TRANSDERMAL | @ 15:00:00 | Stop: 2019-06-09 | NDC 00591352530

## 2019-05-17 MED ADMIN — LIDOCAINE 5 % EX PTCH: 1 | TRANSDERMAL | @ 04:00:00 | Stop: 2019-05-19

## 2019-05-17 MED ADMIN — LEVOTHYROXINE SODIUM 75 MCG PO TABS: 75 ug | ORAL | @ 14:00:00 | Stop: 2019-05-19 | NDC 51079044120

## 2019-05-17 MED ADMIN — ALLOPURINOL 50 MG PO TABS: 50 mg | ORAL | @ 15:00:00 | Stop: 2019-05-17 | NDC 00603211521

## 2019-05-17 MED ADMIN — ACETAMINOPHEN 325 MG PO TABS: 650 mg | ORAL | @ 06:00:00 | Stop: 2019-05-19 | NDC 50580045811

## 2019-05-17 MED ADMIN — ACETAMINOPHEN 325 MG PO TABS: 650 mg | ORAL | @ 01:00:00 | Stop: 2019-05-19 | NDC 50580045811

## 2019-05-17 MED ADMIN — HEPARIN SODIUM (PORCINE) 5000 UNIT/ML IJ SOLN: 5000 [IU] | SUBCUTANEOUS | @ 03:00:00 | Stop: 2019-05-22 | NDC 00641040012

## 2019-05-17 NOTE — Progress Notes
[facility]IENT CONSULTATION    DATE OF SERVICE: 05/17/2019  ~  ADMISSION DATE: 05/10/2019    ~ HOSPITAL DAY: 7  PRINCIPLE PROBLEM: <principal problem not specified> ~ PMD: Salley Slaughter, MD    PRIMARY TEAM: Medicine - Geriatrics  REQUESTING PHYSICIAN(S): Alba Cory, MD, PhD    CC/REASON FOR CONSULTATION: L neck pain    INTERVAL EVENTS AND REVIEW OF SYSTEMS:  L neck pain improved on Saturday, now well controlled with apap and lido patch. Has restored ROM. Otherwise no new cardiac, respiratory, or GI symptoms.    MEDICATIONS:  acetaminophen, 650 mg, Oral, Q6H  [START ON 05/18/2019] allopurinol, 50 mg, Oral, Daily  aspirin, 81 mg, Oral, Daily  dilTIAZem, 120 mg, Oral, Daily  epoetin alfa-epbx, 150 Units/kg, Subcutaneous, Once per day on Tue Thu Sat  ferrous sulfate, 325 mg, Oral, Every Other Day  gabapentin, 100 mg, Oral, QHS  heparin, 5,000 Units, Subcutaneous, Q12H  [START ON 05/18/2019] hydroxyurea, 1,000 mg, Oral, Daily  hydroxyurea, 500 mg, Oral, QPM  levothyroxine, 50 mcg, Oral, Every Other Day  levothyroxine, 75 mcg, Oral, Every Other Day  lidocaine, 1 patch, Transdermal, Q24H  losartan, 50 mg, Oral, Daily  magnesium oxide, 400 mg, Oral, Daily  pantoprazole, 40 mg, Oral, Daily  rosuvastatin, 20 mg, Oral, QHS  PRNs: analgesic balm, bisacodyl, diclofenac Sodium, guaiFENesin, hydrALAZINE, lidocaine PF, magnesium hydroxide, melatonin, senna    VITALS:  Temp:  [36.3 ?C (97.3 ?F)-37.3 ?C (99.2 ?F)] 36.3 ?C (97.4 ?F)  Heart Rate:  [78-109] 109  Resp:  [18-20] 18  BP: (103-174)/(55-78) 168/78  NBP Mean:  [76-108] 108  SpO2:  [92 %-100 %] 100 %     Weight: 47.4 kg (104 lb 8 oz) Oxygen Therapy  SpO2: 100 %  O2 Device: None (Room air)  Flow Rate (L/min): 1 L/min     IN'S AND OUT'S:  I/O last 2 completed shifts:  In: 2295 [P.O.:720; I.V.:1575]  Out: 2370 [Urine:2370]    PHYSICAL EXAM:  General: alert, well appearing, and in no distress.   Cardiovascular: pulse regular rate and rhythm. No peripheral edema.  Lungs: normal work of breathing  GI: abdomen soft, nontender, nondistended  Neuro: fully oriented  Musculoskeletal: myofascial trigger/tender points: left trapezius with ongoing swelling but no bruising, only mildly tender to palpation. Normal lateral rotation    DATA:  I have reviewed the following information from the last 24 hours: allied health and treating physician notes and labs and microbiology data    TREATMENTS:  none    ASSESSMENT:  Lindsay Brennan is a 85 y.o. female with CML admitted for profound leukocytosis, with additional L shoulder myofascial pain syndrome vs acute aggravation of chronic RTC pathology. now improved following trigger point injection.     The following problems are being addressed in this hospitalization:  Current Hospital Problems           POA    Hyperleukocytosis Yes          1. Post-injection soreness  2. L shoulder pain  3. L RTC disease    RECOMMENDATIONS:  Improved neck pain following trigger point injection (although she did develop marked post-injection soreness on Friday).    Will hold off further treatment of neck pain for now. Cont pharmacotherapy with apap and lidocaine patch.      AUTHORAlric Quan, MD  05/17/2019 at 4:03 PM  [facility] Inpatient Consult Pager: [phone]

## 2019-05-17 NOTE — Consults
NUTRITION ASSESSMENT (Adult)    Admit Date: 05/10/2019     Date of Birth: 1934/06/18 Gender: female MRN: [id number]     Date of Assessment: 05/17/2019   Status: Assessment   Indication: CKD Stage 4   Subjective: 4/12:  Visited pt and daughter [name] on [place]. Pt sitting up in chair finishing lunch, states does not like mocha flavour Novasource Renal; agreeable to trying vanilla and strawberry. This RD repeated diet instruction that was previously given; discussed Na+/K+/Phos content of foods. Handout given to take home. Daughter reports allergy to coffee.Pt denies nausea/vomiting, states has been having two bowel movements per day which is unusual for pt.    4/7  At visit, pt states she is losing wt. Pt ate 3 meals a day, no ONS/snacks PTA. RD provided tips on wt stabilization/regain and information on low K, low phos diet. Pt amanable to Mocha Novasource renal   Problems: Active Problems:    Hyperleukocytosis POA: Yes       Past Medical History:   Diagnosis Date   ? CKD (chronic kidney disease)    ? GERD (gastroesophageal reflux disease)    ? History of CVA (cerebrovascular accident) 2011   ? 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 Hyperlipidemia    ? Hypertension    ? Hypothyroidism    ? Leukemia (HCC/RAF)    ? Myelodysplasia (myelodysplastic syndrome) (HCC/RAF)     Past Surgical History:   Procedure Laterality Date   ? CESAREAN SECTION     ? MASTECTOMY Bilateral    ? TOTAL ABDOMINAL HYSTERECTOMY             Data   Intake/Outputs: I/O last 2 completed shifts:  In: 2295 [P.O.:720; I.V.:1575]  Out: 2370 [Urine:2370]    Pertinent Medications:   Scheduled Meds:  ? acetaminophen  650 mg Oral Q6H   ? [START ON 05/18/2019] allopurinol  50 mg Oral Daily   ? aspirin  81 mg Oral Daily   ? dilTIAZem  120 mg Oral Daily   ? epoetin alfa-epbx  150 Units/kg Subcutaneous Once per day on Tue Thu Sat   ? ferrous sulfate  325 mg Oral Every Other Day   ? gabapentin  100 mg Oral QHS   ? heparin  5,000 Units Subcutaneous Q12H   ? [START ON 05/18/2019] hydroxyurea  1,000 mg Oral Daily   ? hydroxyurea  500 mg Oral QPM   ? levothyroxine  50 mcg Oral Every Other Day   ? levothyroxine  75 mcg Oral Every Other Day   ? lidocaine  1 patch Transdermal Q24H   ? losartan  50 mg Oral Daily   ? magnesium oxide  400 mg Oral Daily   ? pantoprazole  40 mg Oral Daily   ? rosuvastatin  20 mg Oral QHS     Continuous Infusions:  ? lactated ringers 100 mL/hr (05/17/19 1200)     PRN Meds:.analgesic balm, bisacodyl, diclofenac Sodium, guaiFENesin, hydrALAZINE, lidocaine PF, magnesium hydroxide, melatonin, senna    FDI Target Drugs: No      Pertinent Labs:   Recent Labs     05/17/19  0503 05/14/19  1904 05/14/19  1904   NA 137   < > 139   K 4.1   < > 4.1   BUN 46*   < > 44*   CREAT 1.48*   < > 1.95*   GLUCOSE 88   < >  121*   CALCIUM 8.9   < > 9.0   ICALCOR 1.21   < > 1.20   PHOS 5.6*   < > 4.6*   ALBUMIN  --   --  4.0   HGB 7.4*   < > 8.5*   HCT 24.3*   < > 27.8*   ALKPHOS  --   --  202*    < > = values in this interval not displayed.         Accu-Chek: No results found in last 72 hours    Respiratory Status / O2 Device: None (Room air)    Pressure Injury:     Patient Lines/Drains/Airways Status    Active Pressure Ulcers     None                  Additional data:  Last BM x 1 4/11     Diet Info   ? Allergies:   Coffee, Oxycodone-acetaminophen, Penicillins, Sulfa antibiotics, and Naproxen  ? Cultural/Ethnic/Religious/Other Food Preferences:  None     ? Nutrition prior to admit:  3 meals, regular diet, variable po, no ONS  ? Current diet order:     Diets/Supplements/Feeds   Diet    Diet 1000mg  Phosphorus (Adult) Low K     Start Date/Time: 05/10/19 1950      Number of Occurrences: Until Specified     Order Questions:     ? Potassium: Low K     ? PO % consumed: 51 to 75%  ? Parenteral Nutrition: n/a  ? Enteral Nutrition: n/a  ? Other caloric sources: n/a    Anthropometrics:  Height: 154.9 cm (5' 1'')  Admit Weight: 46.9 kg (103 lb 8 oz) (05/10/19 1756) Last 5 recorded weights:  Weights 05/13/2019 05/14/2019 05/15/2019 05/16/2019 05/17/2019   Weight 47.4 kg (No Data) 48.8 kg 47.7 kg 47.4 kg            IBW: 47.6 kg (105 lb)  % Ideal Body Weight: 100 %  BMI (Calculated): 19.8    Usual Weight: 50.3 kg (110 lb 14.3 oz)(04/2019)  % Usual Weight: 95 %      Estimated Nutrition Needs    Based on adm wt: 47kg     1410-1645kcal (30-35kcal/kg/d), 56-66g protein (1.2-1.4g/kg/d)      Diet Education   Teaching provided (Refer to Patient Education records)(renal (low Na+/K+/Phos) diet instruction)       4/12: renal (low Na+/K+/Phos) diet instruction with pt and daughter Kim--handouts given to take home   Malnutrition Assessment   Malnutrition in the Context of: Chronic Illness/Mild-Moderate Inflammation   Energy Intake: < or = 75% of estimated energy requirement for > or =1 month  Weight Loss: > 5% in 1 month    Nutrition-Focused Physical Exam: 05/12/2019  Orbital: Mild, Upper Arm: Mild, Thoracic/Lumbar: Unable to assess  Subcutaneous Fat Loss: Mild    Temple: Mild, Clavicle: Mild, Deltoid: Mild  Scapula: Unable to assess, Interosseous: Unable to assess, Anterior Thigh: Unable to assess, Patellar Region: Unable to assess, Posterior Calf: Unable to assess  Muscle Loss: Mild        Patient meets criteria for: Non-Severe (Moderate) Malnutrition    Nutrition Assessment   Anthropometrics: Body mass index is 19.75 kg/m?Marland Kitchen Underweight for age. Pt had stated 94% of wt one month ago on prior RD assessment.    Nutrition: 1000 mg Phos, low K+ diet, intakes variable at 5-100% of meals. Does not like mocha flavoured Novasource Renal; agreeable to  trying vanilla and strawberry.    Tolerance/GI: Per RN flowsheet: Abdomen Inspection: Soft, Nondistended. Last BM x 1 4/11.    Skin: no pressure injury    Labs: elevated Phos, elevated BUN/Cr on appropriate diet.    Meds: allopurinol, hydroxyurea, levothyroxine, magnesium     Recommendations / Care Plan      1. Continue low K, 1000mg  phos diet.  2. Will change Novasource Renal shake to vanilla and strawberry per pt request.  3. Will defer vitamins and minerals to primary team.    RD to follow.    Author:  Macy Mis, RD, pager [phone]  05/17/2019 1:50 PM

## 2019-05-17 NOTE — Consults
Inpatient Hematology/Oncology Consultation    Patient name:  Lindsay Brennan  MRN:  [id number]  DOB:  November 03, 1934  Location: 5228/1  Date of service:  05/16/2019    Reason for consultation: CMML  Primary service: Geriatrics   Referring provider: Andrey Cota., MD  PCP: Salley Slaughter, MD  Outpatient hematologist/oncologist (if any): Dr. Osvaldo [facility] of [place] [phone]    History of Presenting Illness:  Lindsay Brennan is a 85 y.o. 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 Per the patient, lightheadedness has time correlation with diltiazem, improving with decrease in the dosage, and only occurring when she rises from seated position. She was planning to go back home to [place] but mentioned dizziness to her family who reportedly found her O2 saturation to be 80's at home, prompting admission. She says she only has dyspnea when lying flat, but improves in supine. She also endorses cough for the past 2 days. COVID negative.   ?  [facility], evaluated by oncology service, started on IVF and allopurinol for uric acid 11.8. other notable labs include LDH 1426, d-dimer 2.4, fibrinogen 271, PT 20.4    CXR demonstrated bilateral lower lobe consolidations  abd [place] with marked splenomegaly  ?  05/11/19 transferred to [facility] to geriatrics service.   Patient reports weight loss, occasional night sweats, early satiety. Continues to have dizziness and SOB with exertion.     05/12/19 started hydrea 500mg  BID yesterday. Breathing and dizziness is stable    05/13/19 no overnight events WBC down to 115    05/14/19 dizziness has resolved, WBC stable. Still with DOE. No fevers, dry cough which is unchanged.     05/15/19:  Denies dizziness.  Denies sob.  Still with some doe.  Denies fevers.  WBC 91, Hgb 7.7.  05/16/19:  No complaints.  WBC 65, Hgb 7.8, uric acid 9.2, Cr 1.74      ROS:  A complete 14 point review of systems has been is negative except for as documented.    Past Medical History:  Past Medical History:   Diagnosis Date   ? CKD (chronic kidney disease)    ? GERD (gastroesophageal reflux disease)    ? History of CVA (cerebrovascular accident) 2011   ? 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 Hyperlipidemia    ? Hypertension    ? Hypothyroidism    ? Leukemia (HCC/RAF)    ? Myelodysplasia (myelodysplastic syndrome) (HCC/RAF)        Past Surgical History:  Past Surgical History:   Procedure Laterality Date   ? CESAREAN SECTION     ? MASTECTOMY Bilateral    ? TOTAL ABDOMINAL HYSTERECTOMY         Social History:  Social History     Tobacco Use   ? Smoking status: Never Smoker   ? Smokeless tobacco: Never Used   Substance Use Topics   ? Alcohol use: Not Currently     Family History:  Family History   Problem Relation Age of Onset   ? Stroke Mother    ? Emphysema Father    ? Ovarian cancer Sister    ? Heart attack Brother    ? Lung cancer Sister    ? Lung cancer Sister    ? Colon cancer Brother    ?  Colon cancer Son        Allergies:  Allergies   Allergen Reactions   ? Oxycodone-Acetaminophen Other (See Comments)   ? Penicillins Hives   ? Sulfa Antibiotics Agranulocytosis   ? Naproxen Rash     Aleve       Outpatient Medications:  Facility-Administered Medications Prior to Admission   Medication Dose Route Frequency Provider Last Rate Last Admin   ? [DISCONTINUED] epoetin alfa-epbx (Retacrit) 10,000 units/mL inj 20,000 Units  20,000 Units Subcutaneous Once Boonpheng, Boonphiphop, MD         Medications Prior to Admission   Medication Sig Dispense Refill Last Dose   ? epoetin alfa-epbx (RETACRIT) 10,000 units/mL injection Inject 20,000 Units under the skin Every month.      ? esomeprazole 40 mg capsule Take 40 mg by mouth daily .   05/09/2019 at Unknown time   ? Ferric Citrate 1 GM 210 MG(Fe) TABS Take 1 tablet by mouth three (3) times daily with meals. 270 tablet 1    ? rosuvastatin 20 mg tablet Take 20 mg by mouth at bedtime .   05/10/2019 at Unknown time   ? sodium bicarbonate 650 mg tablet Take 1 tablet (650 mg total) by mouth two (2) times daily. 60 tablet 2 05/09/2019 at Unknown time   ? SYNTHROID 50 mcg tablet Take 50 mcg by mouth every other day Pt alternates with 75mg .      ? SYNTHROID 75 mcg tablet Take 75 mcg by mouth every other day Pt alternates with 50mg .   05/10/2019 at Unknown time   ? [DISCONTINUED] aspirin 81 mg EC tablet Take 81 mg by mouth daily.   05/09/2019 at Unknown time   ? [DISCONTINUED] cloNIDine 0.1 mg tablet Take 0.1 mg by mouth two (2) times daily.      ? [DISCONTINUED] dilTIAZem (CARDIZEM CD) 180 mg 24 hr capsule Take 180 mg by mouth daily.      ? [DISCONTINUED] gabapentin 100 mg capsule Take 200 mg by mouth at bedtime .   05/09/2019 at Unknown time   ? polyethylene glycol powder packet Take 1 packet (17 g total) by mouth daily.      ? [DISCONTINUED] allopurinol 100 mg tablet Take 2 tablets (200 mg total) by mouth three (3) times daily. (Patient not taking: Reported on 05/11/2019.)   Not Taking at Unknown time   ? [DISCONTINUED] bisacodyl 5 mg EC tablet Take 1 tablet (5 mg total) by mouth daily as needed for Constipation. 100 tablet 0    ? [DISCONTINUED] heparin 5000 unit/mL injection Inject 1 mL (5,000 Units total) under the skin every twelve (12) hours.      ? [DISCONTINUED] hydrALAZINE 10 mg tablet Take 1 tablet (10 mg total) by mouth every six (6) hours.      ? [DISCONTINUED] lactated ringers IV soln Inject 50 mL/hr into the vein continuous. (Patient not taking: Reported on 05/11/2019.)   Not Taking at Unknown time   ? [DISCONTINUED] pantoprazole 40 mg DR tablet Take 1 tablet (40 mg total) by mouth daily.      ? [DISCONTINUED] senna 8.6 mg tablet Take 1 tablet by mouth at bedtime as needed for Constipation. (Patient not taking: Reported on 05/11/2019.)   Not Taking at Unknown time     Inpatient Medications:  Scheduled Meds:  ? acetaminophen  650 mg Oral Q6H   ? allopurinol  50 mg Oral Daily   ? aspirin  81 mg Oral Daily   ? dilTIAZem  120 mg Oral Daily   ? epoetin alfa-epbx  150 Units/kg Subcutaneous Once per day on Tue Thu Sat   ? ferrous sulfate  325 mg Oral Every Other Day   ? gabapentin  100 mg Oral QHS   ? heparin  5,000 Units Subcutaneous Q12H   ? hydroxyurea  1,000 mg Oral BID   ? levothyroxine  50 mcg Oral Every Other Day   ? levothyroxine  75 mcg Oral Every Other Day   ? lidocaine  1 patch Transdermal Q24H   ? [START ON 05/17/2019] losartan  50 mg Oral Daily   ? magnesium oxide  400 mg Oral Daily   ? pantoprazole  40 mg Oral Daily   ? rosuvastatin  20 mg Oral QHS     Continuous Infusions:  ? lactated ringers 75 mL/hr (05/16/19 0551)     PRN Meds:.analgesic balm, bisacodyl, diclofenac Sodium, guaiFENesin, hydrALAZINE, lidocaine PF, magnesium hydroxide, melatonin, senna    Vital signs:  Vitals:    05/16/19 0513 05/16/19 0859 05/16/19 1219 05/16/19 1558   BP: 167/77 129/81 157/74 166/68   Patient Position:       Pulse: 94 98 98 94   Resp: 16 18 17 16    Temp: 36.2 ?C (97.1 ?F) 36.3 ?C (97.3 ?F) 36.1 ?C (97 ?F) 36.5 ?C (97.7 ?F)   TempSrc: Temporal Temporal Temporal Temporal   SpO2: 93% (!) 92% 96% 95%   Weight: 47.7 kg (105 lb 1.6 oz)      Height:         Vital sign ranges (24h):  Temp:  [36.1 ?C (97 ?F)-36.5 ?C (97.7 ?F)] 36.5 ?C (97.7 ?F)  Heart Rate:  [94-98] 94  Resp:  [16-18] 16  BP: (129-175)/(68-82) 166/68  NBP Mean:  [97-113] 101  SpO2:  [92 %-96 %] 95 %    Intake/Output (24h):    Intake/Output Summary (Last 24 hours) at 05/16/2019 1842  Last data filed at 05/16/2019 1841  Gross per 24 hour   Intake 1985 ml   Output 3650 ml   Net -1665 ml       Physical Exam:  General: Appears well-developed, well-nourished and close to stated age.   CV: Regular in rate and rhythm  Chest: Clear to auscultation bilaterally without wheezing or rhonchi.  Respiratory effort appears normal.   Abdomen: Soft, nontender and nondistended.  +splenomegaly Musculoskeletal: No edema. No cyanosis. Extremities are warm and well-perfused.   Psychiatric: Affect appropriate.  Pleasant and conversant.     Labs:    Lab Results   Component Value Date    ALT 16 05/14/2019    AST 37 05/14/2019    ALKPHOS 202 (H) 05/14/2019    BILITOT 0.4 05/14/2019     Lab Results   Component Value Date    CREAT 1.61 (H) 05/16/2019    BUN 46 (H) 05/16/2019    NA 136 05/16/2019    K 4.4 05/16/2019    CL 102 05/16/2019    CO2 21 05/16/2019     Lab Results   Component Value Date    WBC 47.64 (H) 05/16/2019    HGB 8.0 (L) 05/16/2019    HCT 25.6 (L) 05/16/2019    MCV 89.5 05/16/2019    PLT 147 05/16/2019    NEUTPCT 65.5 05/16/2019    NEUTABS 42.60 (H) 05/16/2019    MONOPCT 16.7 05/16/2019    MONOABS 10.83 (H) 05/16/2019     Coags:  Recent Labs     05/16/19  1606 05/16/19  0510 05/15/19  1646   PT 15.7* 16.8* 16.1*   INR 1.3 1.4 1.4   APTT 61.9* 58.7* 44.5*   FIBRINOGEN 216* 194* 192*   DDIMER 2.34* 2.37* 2.51*     Flow cytometry  INTERPRETATION:  Small population of myeloblasts (1% of total events)   ?  COMMENT:  Marked leukocytosis with neutrophilia, monocytosis and rare blasts. Please correlate with FISH studies.    Pertinent imaging:  05/10/19 abd US  IMPRESSION:  1. Marked splenomegaly.  Mild hepatomegaly.  2. Borderline right hydronephrosis.  ?  05/09/19 CXR  IMPRESSION:  ?  Stable cardiomediastinal silhouette with mildly tortuous, calcified thoracic aorta. Persistent mild cardiomegaly.  Normal lung volumes. Increased density of the bilateral lower lung zones is accentuated due to overlying breast implants.  There are bilateral lower lobe patchy consolidations, most suggestive of pulmonary edema versus pneumonia.   No right pleural effusion. Trace left pleural effusion. No pneumothorax. Elevation of the left hemidiaphragm  No acute osseous abnormalities.  ?    Pertinent pathology:  Patlent Name: ELAYAH, KLOOSTER Accession#: [id number] Med. Rec. # [id number] Taken: 10/30/2016 Encounter #: [id number] Received: 10/30/2016 Gender: F Reported: 11/04/2016 16:53 DOB: [birth year] (Age: 52)  Submitting Physician: Hubert Azure D M.D.  Additional Physician(s):  [facility]: [facility]  Clinical Information:  85 year old with remota history of mild pancytopenia in 2008. There ara no recent history or CBC studies.  Specimen Received:  A: Bone marrow,aspirate for clot section, pic,cyto sent to iu B: Bone marrow, biopsy (decalelfied),Ipic  ?  Final  Pathologic Diagnosis:  id B. Bone marrow, bio and aspirate for clot section, Jeft posterioriliac crest: Myeloid neoplasm. See note.  As the senior physician, ~ attest that I: (i) examined the relevant preparation(s) for the specimen(s); and (ii) rendered or confirmed the diagnosis(es).  ?Electronically Signed ngz1/10/30/2016 Jobie Quaker, M.D. (Pathologist)  Deanne Coffer, M.D. 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 Clinical correlation and correlation with cytogenetics/FISH study including BCR-ABL7 fusion study is recommended for further classification.  Dr. 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  ?  Cellularity: Markedly hypercellular (> 90%) Myslopoiesis: Markedly increased with full maturation Erythropoiesis: Decreased Megakaryopoiesis: Markedly increased with frequent hypolobated/monolobated megakaryocytes including small hyperchromatic forms Additional studles: Reticulin stain shows mild increasein reticulin fibrosis (1+) with appropriate control,  Theimmunohistechemical stains are performedin this tase and same of the antigens may also ba evaluated by flow cytometry, Concument immunchistechemieal stains on tissue sections are Indicated in order to correlate immunophenotype with cell morphology, evaluate spatial dletribution/architectural features and/or for a quantitation of disease involvement  Bone marrow,aspirate smear:  Sample quality: Adequate  ME ratio: Markedly increased, ~ 8:1 Blasts: Not increased (4%) ' Myelopoiesis: Markedly increased with occasional hypolobated neutrophils Erythropoiesis: Decreased with megaloblastoid changes Megakaryopoiesis: Markedly increased with frequent hypolobated/monolobated megakaryocytes Comments: No clusters of blasts, no Auer rods. Additional studies: Iron stain performed at the [facility] showsincreased storage iron with no ring sideroblasts. Control stains appropriately,  Bone marrow differential:  Percent  Blasts 2  Promyeltocytes 7  Myelocytes 23  Metamyelocytes 6  Bands 16  Polys 30  Lymphocytes 3  Monocytes 2  Eosinophils 4  Basophils 1  Nucleated RBCs 10  Peripheral blood, smear:  CBC: WBC 46 k/cumm, hemoglobIn 10,7 g/dL, MCV 81 fL, ROW 84.6%, platelet count 316 k/cumm Differential: Neutrophils 57%, lymphocytes 5%, monocytes 21%, myelocytas  3%, Metamyelocytes 8%, band 6%  RBCs: Normocytic anaemia with anlsopgikilocytosis. WEBCs: Leucocytosis, granulocytosis with left-shift, monocytosis. Platelets: Normal platelet caunt with occasional giantplatelets  ?  Results-Comments  CD34 and p53 stains show rare CD34+ blasts (<5%) and exceedingly rare p53+ cells. Controls stain appropriately.  ?  INTERERETATION: (600 cell diff)  ?  Bone Marrow Aspirate specimen is adequate and is markedly hypercellular. Spicules are present.  Megakaryocytes Are increaged in numberwith occasional small mono/hypolobated forms and rare small separated lobe forms identified. My elopoiesis Is abnormal, increased, and proceeds to completion. Blasts and promonocytes comprise 6% of total nucleated cells. 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 The M/E ratio is 8.871.  Erythroid series Shows occasional megaloblastoid changes.  Lymphoid Lymphocytes comprise 2% of total nucleated cells,  Comments: Correlate with bone marrow biopsy and cytogenetic studies,  Tron Stain Results: +2/6 Iron in reticulum, no ringed formsidentified.  Control: positive.  IMPRESSION: DATESIGNED OUT: 11/01/2016 TIME: 10:10 CMML-1.  COMMENT: The marrow biopsy was not available at the time of finalizing the aspirate report. In the event of a marrow biopsy report with discrepant results an addendum will be provided by [facility].  ?  My blast count was 8% ( 300 cell differential) with 15% monocytes; trilineage dysplasia; increased numbers of megakaryocytes and 90-100% cellular particles.  Becauseofthe high WBC I did my counting very close to the particles to void any dilution effect from the peripheral blood.  1% blasts in PBS and 19% monocytes.  JMB      Impression and Recommendations:  Derrica Sieg is a 85 y.o. 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 We are asked to consult regarding management of hyperleukocytosis.    Patient with symptomatic hyperleukocytosis. I recommended we start hydroxyurea 500mg  BID (50% dose reduction for CrCl<60)  Spoke with patients primary hematologist Dr. Marcellus Scott who is in agreement with hydrea. Would wish to avoid HMA given her age. Anticipate to see improvement in WBC within 24-48 hours. Suspect her dizziness and hypoxia are a result of elevated WBC, however I do not think she needs urgent leukopheresis give hypoxia is improving.  We will consider leukopheresis if symptoms do not improve   Continue IVF and allopurinol for elevated uric acid  Follow up flow cytometry results    05/12/19 continue hydrea 500mg  BID. Reviewed flow cytometry results with hemepath Dr. Reginia Forts - approximately 2% blasts, numerous neutrophils and monocytes. Continue allopurinol and TLS labs. 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 May increase dose of hydrea tomorrow if dose not continue to downtrend further     05/14/19 increase hydrea to 1000mg  BID. Monitor hemoglobin and platelets closely. start epo is h/h drops <8    05/15/19: continue hydrea 1000mg  BID.  Start epo for Hgb <8.  Monitor CBC closely     05/16/19:  Continue hydrea 1000mg  BID.  Continue epo.  Uric acid and phos uptrending, consistent with TLS.  Potassium and calcium wnl.  Cr slowly improving.  Recommend increase IV hydration.  Can discuss increasing allopurinol dose as kidney function is improving.  Order LDH at next blood draw.        Thank you for this consultation. We will continue to follow the patient.    Kandis Fantasia

## 2019-05-17 NOTE — Nursing Note
1910: Patient in bed on RA non-tele A&Ox4 daughter at bedside BP 174/71  ~ Pulse 91  ~ Temp 36.7 C (98.1 F) (Temporal)  ~ Resp 18  ~ Ht 1.549 m (5' 1'')  ~ Wt 47.7 kg (105 lb 1.6 oz)  ~ SpO2 95%  ~ BMI 19.86 kg/m   No c/o pain  LR at 75 ml/hr   Fall precautions in place, call light within reach.

## 2019-05-17 NOTE — Other
Patients Clinical Goal:   Clinical Goal(s) for the Shift: vss, comfort, pain control  Identify possible barriers to advancing the care plan:   Stability of the patient: Moderately Unstable - medium risk of patient condition declining or worsening    End of Shift Summary: Patient A&Ox4 on RA non-tele BP 103/62  ~ Pulse 84  ~ Temp 37.1 ?C (98.7 ?F) (Temporal)  ~ Resp 20  ~ Ht 1.549 m (5' 1'')  ~ Wt 47.7 kg (105 lb 1.6 oz)  ~ SpO2 (!) 92%  ~ BMI 19.86 kg/m?   Patient give tylenol for left shoulder pain.   IV fluids continued LR at 75 ml/hr   Fall precautions in place, call light within reach.    GERIATRICS END OF SHIFT - DISCHARGE MARKERS of Instability Checklist  Nursing assessment:     Indicator   Cutoff Check if indicator needs to be addressed (meets cutoff)   Situation/Action/Comment   O2 requirement New since admission []     PO intake Less than 50% []     Urination None in past 8 hours/last shift []     Foley New since admission []     Pain Present  [x]   Left shoulder pain    Bowel movements  <1 in 2 days or >3 in 24 hours []     Delirium Unable to follow simple commands or participate with PT/OT []     Mobility Unable to stand and/or walk to bathroom []  OOB to chair   Ambulated around unit and to restroom using FWW       BOOST / SAFE TRANSITION - DISCHARGE INDICATORS    Indicator Check if indicator has red ''P''    Situation/Action/Comment     Problems with Medications  []     Psychological  []     Principal Diagnosis  []     Physical Limitations  []     Poor Health Literacy  []     Patient Support  []     Prior Hospitalizations  [x]     Palliative Care  []       DELIRIUM PREVENTION  Protocols Strategies Check if implemented Comments   Risk factors >3 present- High risk [x]     Purposeful orientation Reorient, purposeful orienting conversation  Familiar objects in room []   []     Therapeutic activities Volunteer visit  Cognitive stimulation activities: games, reading, music []   []     Vision & hearing Assistance with: Leisure centre manager  Assistance with: hearing aids/hearing amplifier []   []     Feeding & hydration Assistance with feeding  Assistance with dentures []   []     Sleep hygiene Shades/blinds/lights on during day, limit naps during day  Quiet environment, consolidate care []     Mobilization BMAT 3-4: Ambulate TID  BMAT 2: OOB daily to chair for meals ? 2 hours, each time  BMAT 1: OOB to cardiac chair daily for meals ? 2 hours, each time []   []   []     Pain Non-narcotics ATC  Non-pharmacological: oil/aromatherapy, massage, music []   []     Maintain safety Fall precautions, volunteer visit, family at bedside, tele sitter, constant observer []     Manage agitation Redirect with calm, gentle voice and avoid confrontation  Avoid restraints and use alternative to restraints  Doll, music or animal therapy, as appropriate  Volunteer for companionship if safe and appropriate []   []   []   []       DELIRIUM: CAM (+)  Protocols Strategies Check if implemented Comments   New-onset MD contacted  Delirium order-set initiated  Bladder scan to R/O retention  Assess stool impaction  Medication reviewed with pharmacist []   []   []   []   []  MD Name:   Existing Manage and prevent further delirium []

## 2019-05-17 NOTE — Consults
Inpatient Hematology/Oncology Consultation    Patient name:  Chizuko Trine  MRN:  [id number]  DOB:  1935-01-11  Location: 5228/1  Date of service:  05/17/2019    Reason for consultation: CMML  Primary service: Geriatrics   Referring provider: Andrey Cota., MD  PCP: Salley Slaughter, MD  Outpatient hematologist/oncologist (if any): Dr. Osvaldo Human of [place] [phone]    History of Presenting Illness:  Amandalynn Pitz is a 85 y.o. 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 Per the patient, lightheadedness has time correlation with diltiazem, improving with decrease in the dosage, and only occurring when she rises from seated position. She was planning to go back home to [place] but mentioned dizziness to her family who reportedly found her O2 saturation to be 80's at home, prompting admission. She says she only has dyspnea when lying flat, but improves in supine. She also endorses cough for the past 2 days. COVID negative.   ?  [facility], evaluated by oncology service, started on IVF and allopurinol for uric acid 11.8. other notable labs include LDH 1426, d-dimer 2.4, fibrinogen 271, PT 20.4    CXR demonstrated bilateral lower lobe consolidations  abd [place] with marked splenomegaly  ?  05/11/19 transferred to [facility] to geriatrics service.   Patient reports weight loss, occasional night sweats, early satiety. Continues to have dizziness and SOB with exertion.     05/12/19 started hydrea 500mg  BID yesterday. Breathing and dizziness is stable  05/13/19 no overnight events WBC down to 115  05/14/19 dizziness has resolved, WBC stable. Still with DOE. No fevers, dry cough which is unchanged. Increased hydrea to 1000mg  BID  05/15/19:  Denies dizziness.  Denies sob.  Still with some doe.  Denies fevers.  WBC 91, Hgb 7.7. started retacrit 7,300units T/Th/Sat 05/16/19:  No complaints.  WBC 65, Hgb 7.8, uric acid 9.2, Cr 1.74  05/17/19: SOB and dizziness are mild/stable. WBC 30, hgb 7.4, platelets 132, uric acid 10.     ROS:  A complete 14 point review of systems has been is negative except for as documented.    Past Medical History:  Past Medical History:   Diagnosis Date   ? CKD (chronic kidney disease)    ? GERD (gastroesophageal reflux disease)    ? History of CVA (cerebrovascular accident) 2011   ? 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 Hyperlipidemia    ? Hypertension    ? Hypothyroidism    ? Leukemia (HCC/RAF)    ? Myelodysplasia (myelodysplastic syndrome) (HCC/RAF)        Past Surgical History:  Past Surgical History:   Procedure Laterality Date   ? CESAREAN SECTION     ? MASTECTOMY Bilateral    ? TOTAL ABDOMINAL HYSTERECTOMY         Social History:  Social History     Tobacco Use   ? Smoking status: Never Smoker   ? Smokeless tobacco: Never Used   Substance Use Topics   ? Alcohol use: Not Currently     Family History:  Family History   Problem Relation Age of Onset   ? Stroke Mother    ? Emphysema Father    ? Ovarian cancer Sister    ? Heart attack Brother    ? Lung  cancer Sister    ? Lung cancer Sister    ? Colon cancer Brother    ? Colon cancer Son        Allergies:  Allergies   Allergen Reactions   ? Oxycodone-Acetaminophen Other (See Comments)   ? Penicillins Hives   ? Sulfa Antibiotics Agranulocytosis   ? Naproxen Rash     Aleve       Outpatient Medications:  Facility-Administered Medications Prior to Admission   Medication Dose Route Frequency Provider Last Rate Last Admin   ? [DISCONTINUED] epoetin alfa-epbx (Retacrit) 10,000 units/mL inj 20,000 Units  20,000 Units Subcutaneous Once Boonpheng, Boonphiphop, MD         Medications Prior to Admission   Medication Sig Dispense Refill Last Dose   ? epoetin alfa-epbx (RETACRIT) 10,000 units/mL injection Inject 20,000 Units under the skin Every month.      ? esomeprazole 40 mg capsule Take 40 mg by mouth daily .   05/09/2019 at Unknown time   ? Ferric Citrate 1 GM 210 MG(Fe) TABS Take 1 tablet by mouth three (3) times daily with meals. 270 tablet 1    ? rosuvastatin 20 mg tablet Take 20 mg by mouth at bedtime .   05/10/2019 at Unknown time   ? sodium bicarbonate 650 mg tablet Take 1 tablet (650 mg total) by mouth two (2) times daily. 60 tablet 2 05/09/2019 at Unknown time   ? SYNTHROID 50 mcg tablet Take 50 mcg by mouth every other day Pt alternates with 75mg .      ? SYNTHROID 75 mcg tablet Take 75 mcg by mouth every other day Pt alternates with 50mg .   05/10/2019 at Unknown time   ? [DISCONTINUED] aspirin 81 mg EC tablet Take 81 mg by mouth daily.   05/09/2019 at Unknown time   ? [DISCONTINUED] cloNIDine 0.1 mg tablet Take 0.1 mg by mouth two (2) times daily.      ? [DISCONTINUED] dilTIAZem (CARDIZEM CD) 180 mg 24 hr capsule Take 180 mg by mouth daily.      ? [DISCONTINUED] gabapentin 100 mg capsule Take 200 mg by mouth at bedtime .   05/09/2019 at Unknown time   ? polyethylene glycol powder packet Take 1 packet (17 g total) by mouth daily.      ? [DISCONTINUED] allopurinol 100 mg tablet Take 2 tablets (200 mg total) by mouth three (3) times daily. (Patient not taking: Reported on 05/11/2019.)   Not Taking at Unknown time   ? [DISCONTINUED] bisacodyl 5 mg EC tablet Take 1 tablet (5 mg total) by mouth daily as needed for Constipation. 100 tablet 0    ? [DISCONTINUED] heparin 5000 unit/mL injection Inject 1 mL (5,000 Units total) under the skin every twelve (12) hours.      ? [DISCONTINUED] hydrALAZINE 10 mg tablet Take 1 tablet (10 mg total) by mouth every six (6) hours.      ? [DISCONTINUED] lactated ringers IV soln Inject 50 mL/hr into the vein continuous. (Patient not taking: Reported on 05/11/2019.)   Not Taking at Unknown time   ? [DISCONTINUED] pantoprazole 40 mg DR tablet Take 1 tablet (40 mg total) by mouth daily.      ? [DISCONTINUED] senna 8.6 mg tablet Take 1 tablet by mouth at bedtime as needed for Constipation. (Patient not taking: Reported on 05/11/2019.)   Not Taking at Unknown time     Inpatient Medications:  Scheduled Meds:  ? acetaminophen  650 mg Oral Q6H   ? [  START ON 05/18/2019] allopurinol  50 mg Oral Daily   ? aspirin  81 mg Oral Daily   ? dilTIAZem  120 mg Oral Daily   ? epoetin alfa-epbx  150 Units/kg Subcutaneous Once per day on Tue Thu Sat   ? ferrous sulfate  325 mg Oral Every Other Day   ? gabapentin  100 mg Oral QHS   ? heparin  5,000 Units Subcutaneous Q12H   ? [START ON 05/18/2019] hydroxyurea  1,000 mg Oral Daily   ? hydroxyurea  500 mg Oral QPM   ? levothyroxine  50 mcg Oral Every Other Day   ? levothyroxine  75 mcg Oral Every Other Day   ? lidocaine  1 patch Transdermal Q24H   ? losartan  50 mg Oral Daily   ? magnesium oxide  400 mg Oral Daily   ? pantoprazole  40 mg Oral Daily   ? rosuvastatin  20 mg Oral QHS     Continuous Infusions:  ? lactated ringers 100 mL/hr (05/17/19 1200)     PRN Meds:.analgesic balm, bisacodyl, diclofenac Sodium, guaiFENesin, hydrALAZINE, lidocaine PF, magnesium hydroxide, melatonin, senna    Vital signs:  Vitals:    05/17/19 0528 05/17/19 0617 05/17/19 0814 05/17/19 1151   BP:  168/76 124/71 168/78   Patient Position:       Pulse:   78 (!) 109   Resp:   19 18   Temp:   36.3 ?C (97.3 ?F) 36.3 ?C (97.4 ?F)   TempSrc:   Oral Oral   SpO2:   98% 100%   Weight: 47.4 kg (104 lb 8 oz)      Height:         Vital sign ranges (24h):  Temp:  [36.3 ?C (97.3 ?F)-37.3 ?C (99.2 ?F)] 36.3 ?C (97.4 ?F)  Heart Rate:  [78-109] 109  Resp:  [16-20] 18  BP: (103-174)/(55-78) 168/78  NBP Mean:  [76-108] 108  SpO2:  [92 %-100 %] 100 %    Intake/Output (24h):    Intake/Output Summary (Last 24 hours) at 05/17/2019 1320  Last data filed at 05/17/2019 1200  Gross per 24 hour   Intake 2530 ml   Output 2170 ml   Net 360 ml       Physical Exam:  General: Appears well-developed, well-nourished and close to stated age.   CV: Regular in rate and rhythm  Chest: Clear to auscultation bilaterally without wheezing or rhonchi.  Respiratory effort appears normal.   Abdomen: Soft, nontender and nondistended.  +splenomegaly  Musculoskeletal: No edema. No cyanosis. Extremities are warm and well-perfused.   Psychiatric: Affect appropriate.  Pleasant and conversant.     Labs:    Lab Results   Component Value Date    ALT 16 05/14/2019    AST 37 05/14/2019    ALKPHOS 202 (H) 05/14/2019    BILITOT 0.4 05/14/2019     Lab Results   Component Value Date    CREAT 1.48 (H) 05/17/2019    BUN 46 (H) 05/17/2019    NA 137 05/17/2019    K 4.1 05/17/2019    CL 104 05/17/2019    CO2 21 05/17/2019     Lab Results   Component Value Date    WBC 30.08 (H) 05/17/2019    HGB 7.4 (L) 05/17/2019    HCT 24.3 (L) 05/17/2019    MCV 91.7 05/17/2019    PLT 132 (L) 05/17/2019    NEUTPCT 65.5 05/17/2019    NEUTABS 19.73 (H) 05/17/2019  MONOPCT 14.5 05/17/2019    MONOABS 4.35 (H) 05/17/2019     Coags:  Recent Labs     05/17/19  0503 05/16/19  1606 05/16/19  0510   PT 15.7* 15.7* 16.8*   INR 1.3 1.3 1.4   APTT 61.1* 61.9* 58.7*   FIBRINOGEN 209* 216* 194*   DDIMER 2.14* 2.34* 2.37*     Flow cytometry  INTERPRETATION:  Small population of myeloblasts (1% of total events)   ?  COMMENT:  Marked leukocytosis with neutrophilia, monocytosis and rare blasts. Please correlate with FISH studies.    Pertinent imaging:  05/10/19 abd US  IMPRESSION:  1. Marked splenomegaly.  Mild hepatomegaly.  2. Borderline right hydronephrosis.  ?  05/09/19 CXR  IMPRESSION:  ?  Stable cardiomediastinal silhouette with mildly tortuous, calcified thoracic aorta. Persistent mild cardiomegaly.  Normal lung volumes. Increased density of the bilateral lower lung zones is accentuated due to overlying breast implants.  There are bilateral lower lobe patchy consolidations, most suggestive of pulmonary edema versus pneumonia.   No right pleural effusion. Trace left pleural effusion. No pneumothorax. Elevation of the left hemidiaphragm  No acute osseous abnormalities.  ? Pertinent pathology:  Patlent Name: GENESSIS, FLANARY Accession#: [id number] Med. Rec. # [id number] Taken: 10/30/2016 Encounter #: [id number] Received: 10/30/2016 Gender: F Reported: 11/04/2016 16:53 DOB: 1934-10-30 (Age: 85)  Submitting Physician: Hubert Azure D M.D.  Additional Physician(s):  Location: [facility]  Clinical Information:  85 year old with remota history of mild pancytopenia in 2008. There ara no recent history or CBC studies.  Specimen Received:  A: Bone marrow,aspirate for clot section, pic,cyto sent to iu B: Bone marrow, biopsy (decalelfied),Ipic  ?  Final  Pathologic Diagnosis:  id B. Bone marrow, bio and aspirate for clot section, Jeft posterioriliac crest: Myeloid neoplasm. See note.  As the senior physician, ~ attest that I: (i) examined the relevant preparation(s) for the specimen(s); and (ii) rendered or confirmed the diagnosis(es).  ?Electronically Signed ngz1/10/30/2016 Jobie Quaker, M.D. (Pathologist)  Deanne Coffer, M.D. 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 Clinical correlation and correlation with cytogenetics/FISH study including BCR-ABL7 fusion study is recommended for further classification.  Dr. 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  ?  Cellularity: Markedly hypercellular (> 90%) Myslopoiesis: Markedly increased with full maturation Erythropoiesis: Decreased Megakaryopoiesis: Markedly increased with frequent hypolobated/monolobated megakaryocytes including small hyperchromatic forms Additional studles: Reticulin stain shows mild increasein reticulin fibrosis (1+) with appropriate control,  Theimmunohistechemical stains are performedin this tase and same of the antigens may also ba evaluated by flow cytometry, Concument immunchistechemieal stains on tissue sections are Indicated in order to correlate immunophenotype with cell morphology, evaluate spatial dletribution/architectural features and/or for a quantitation of disease involvement  Bone marrow,aspirate smear:  Sample quality: Adequate  ME ratio: Markedly increased, ~ 8:1 Blasts: Not increased (4%) ' Myelopoiesis: Markedly increased with occasional hypolobated neutrophils Erythropoiesis: Decreased with megaloblastoid changes Megakaryopoiesis: Markedly increased with frequent hypolobated/monolobated megakaryocytes Comments: No clusters of blasts, no Auer rods. Additional studies: Iron stain performed at the [facility] showsincreased storage iron with no ring sideroblasts. Control stains appropriately,  Bone marrow differential:  Percent  Blasts 2  Promyeltocytes 7  Myelocytes 23  Metamyelocytes 6  Bands 16  Polys 30  Lymphocytes 3  Monocytes 2  Eosinophils 4  Basophils 1  Nucleated RBCs 10  Peripheral blood, smear:  CBC: WBC 46 k/cumm, hemoglobIn 10,7 g/dL, MCV 81 fL, ROW 57.8%, platelet count 316 k/cumm Differential: Neutrophils 57%, lymphocytes 5%, monocytes 21%, myelocytas 3%, Metamyelocytes 8%, band 6%  RBCs: Normocytic anaemia with anlsopgikilocytosis. WEBCs: Leucocytosis, granulocytosis with left-shift, monocytosis. Platelets: Normal platelet caunt with occasional giantplatelets  ?  Results-Comments  CD34 and p53 stains show rare CD34+ blasts (<5%) and exceedingly rare p53+ cells. Controls stain appropriately.  ?  INTERERETATION: (600 cell diff)  ?  Bone Marrow Aspirate specimen is adequate and is markedly hypercellular. Spicules are present.  Megakaryocytes Are increaged in numberwith occasional small mono/hypolobated forms and rare small separated lobe forms identified. My elopoiesis Is abnormal, increased, and proceeds to completion. Blasts and promonocytes comprise 6% of total nucleated cells. 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 The M/E ratio is 8.871.  Erythroid series Shows occasional megaloblastoid changes.  Lymphoid Lymphocytes comprise 2% of total nucleated cells,  Comments: Correlate with bone marrow biopsy and cytogenetic studies,  Tron Stain Results: +2/6 Iron in reticulum, no ringed formsidentified.  Control: positive.  IMPRESSION: DATESIGNED OUT: 11/01/2016 TIME: 10:10 CMML-1.  COMMENT: The marrow biopsy was not available at the time of finalizing the aspirate report. In the event of a marrow biopsy report with discrepant results an addendum will be provided by [facility].  ?  My blast count was 8% ( 300 cell differential) with 15% monocytes; trilineage dysplasia; increased numbers of megakaryocytes and 90-100% cellular particles.  Becauseofthe high WBC I did my counting very close to the particles to void any dilution effect from the peripheral blood.  1% blasts in PBS and 19% monocytes.  JMB      Impression and Recommendations:  Mindee Robledo is a 85 y.o. 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 We are asked to consult regarding management of hyperleukocytosis.    Patient with symptomatic hyperleukocytosis. I recommended we start hydroxyurea 500mg  BID (50% dose reduction for CrCl<60)  Spoke with patients primary hematologist Dr. Marcellus Scott who is in agreement with hydrea. Would wish to avoid HMA given her age. Anticipate to see improvement in WBC within 24-48 hours. Suspect her dizziness and hypoxia are a result of elevated WBC, however I do not think she needs urgent leukopheresis give hypoxia is improving.  We will consider leukopheresis if symptoms do not improve   Continue IVF and allopurinol for elevated uric acid  Follow up flow cytometry results    05/12/19 continue hydrea 500mg  BID. Reviewed flow cytometry results with hemepath Dr. Reginia Forts - approximately 2% blasts, numerous neutrophils and monocytes. Continue allopurinol and TLS labs. 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 May increase dose of hydrea tomorrow if dose not continue to downtrend further     05/14/19 increase hydrea to 1000mg  BID. Monitor hemoglobin and platelets closely. start epo is h/h drops <8    05/15/19: continue hydrea 1000mg  BID.  Start epo for Hgb <8.  Monitor CBC closely     05/16/19:  Continue hydrea 1000mg  BID.  Continue epo.  Uric acid and phos uptrending, consistent with TLS.  Potassium and calcium wnl.  Cr slowly improving.  Recommend increase IV hydration.  Can discuss increasing allopurinol dose as kidney function is improving.  Order LDH at next blood draw.     05/17/19 anemia , thrombocytopenia due to hydrea. Please decrease hydrea to  1000mg  qam and 500mg  qpm. Continue retacrit 150units/kg T/Th/sat (renal dosing). Uric acid up trending, increase IVF. Allopurinol at max dose for CrCl. Consider phosphate binder if recommended by nephrology. Anticipate discharge once uric acid is downtrending.      Thank you for this consultation. We will continue to follow the patient.    Alyssah Algeo K. Ramya Vanbergen

## 2019-05-17 NOTE — Progress Notes
DAILY GERIATRIC PROGRESS NOTE    DATE OF SERVICE: 05/17/2019  HOSPITAL DAY: 7  CHIEF COMPLAINT: No chief complaint on file.    ID: [id number] with history of CKD 4, HTN, and CMML presenting with lightheadedness and SOB, found to have hyperleukocytosis (WBC >100k), for which she is admitted.    INTERVAL EVENTS:  - NAOE   - Lisinopril was replaced by Losartan for cough  - continued on mIVF at 75cc/hr  - diltiazem was restarted yesterday morning at 120mg     SUBJECTIVE:  - Patient reports difficulty falling asleep last night  - Still having cough, which apparently has been chronic for many months. No post nasal drip. Eyes feel itchy ,but otherwise no stuffy nose. No heart burn type symptoms. No fevers, chills. Cough is worse at night and guafenisin does not always seem to help.   - Otherwise patient is feeling well. She denies any fevers, chills, n/v, chest pain, shortness of breath, abdominal pain.    MEDICATIONS:  Scheduled:  acetaminophen, 650 mg, Oral, Q6H  [START ON 05/18/2019] allopurinol, 50 mg, Oral, Daily  aspirin, 81 mg, Oral, Daily  dilTIAZem, 120 mg, Oral, Daily  epoetin alfa-epbx, 150 Units/kg, Subcutaneous, Once per day on Tue Thu Sat  ferrous sulfate, 325 mg, Oral, Every Other Day  gabapentin, 100 mg, Oral, QHS  heparin, 5,000 Units, Subcutaneous, Q12H  [START ON 05/18/2019] hydroxyurea, 1,000 mg, Oral, Daily  hydroxyurea, 500 mg, Oral, QPM  levothyroxine, 50 mcg, Oral, Every Other Day  levothyroxine, 75 mcg, Oral, Every Other Day  lidocaine, 1 patch, Transdermal, Q24H  losartan, 50 mg, Oral, Daily  magnesium oxide, 400 mg, Oral, Daily  pantoprazole, 40 mg, Oral, Daily  rosuvastatin, 20 mg, Oral, QHS  PRN:  analgesic balm, bisacodyl, diclofenac Sodium, guaiFENesin, hydrALAZINE, lidocaine PF, magnesium hydroxide, melatonin, senna  Infusions:  ? lactated ringers         PHYSICAL EXAM:  Temp:  [36.1 ?C (97 ?F)-37.3 ?C (99.2 ?F)] 36.3 ?C (97.3 ?F)  Heart Rate:  [78-98] 78  Resp:  [16-20] 19  BP: (103-174)/(55-76) 124/71  NBP Mean:  [76-107] 89  SpO2:  [92 %-98 %] 98 %  I/O: I/O last 2 completed shifts:  In: 2295 [P.O.:720; I.V.:1575]  Out: 2370 [Urine:2370]   +600cc    General:   Well-appearing in NAD   Eyes:   PERRL, EOM grossly intact, sclerae and conjunctivae anicteric and non-injected bilaterally with no pallor or exudates.   Ears:   Hearing grossly intact   Nose/Mouth:  MMM without exudates, ulcers, or petechiae.   Neck:  Normal range of motion. No JVD   Lymph:   No anterior/posterior cervical or submandibular LAD.   Lungs:   CTAB, no wheeze, rhonchi, or crackles   Chest/Heart:   RRR, normal S1 S2, crescendo-decrescendo 2/6 systolic murmur heard best at 2nd R intercostal space, otherwise no MGR.    Abdomen:   Non-distended. No tenderness or masses on palpation, no rebound or guarding. +Splenomegaly   GU:   Not indicated.   Skin/integument:  No rashes, lesions, or breakdown. Fingernails with normal contour, color, and lunulae.   Extremities:   No cyanosis, clubbing, or edema. . Preserved ROM of L shoulder.     Neuro:   Alert and interactive, cranial nerves II-XII grossly intact,  MS 5/5 in all extremities. Walks smoothly with FWW       CAM DELIRIUM TEST:  Feature One:   A. Acute Change in mental status from baseline? []  yes [x]  no  B. Is there fluctuation in the mental status? []  yes [x]  no    Feature Two:  A. Does that patient have difficulty focusing attention?  []  yes [x]  no   (days of week, serial 7s)    Feature Three:  A.Is there evidence of disorganized thinking?  []  yes [x]  no    Feature Four:  A. Does the patient have an altered level of consciousness (ie: vigilant, lethargic, stuporous, comatous?  []  yes [x]  no    The diagnosis of delirium by CAM requires the presence of features 1 and 2 and either 3 or 4      DATA:  I have reviewed the following information from the last 24 hours: allied health and treating physician notes, imaging, labs and microbiology data and cardiac studies and telemetry data:    Lab Results   Component Value Date    WBC 30.08 (H) 05/17/2019    HGB 7.4 (L) 05/17/2019    HCT 24.3 (L) 05/17/2019    PLT 132 (L) 05/17/2019     Lab Results   Component Value Date    NA 137 05/17/2019    K 4.1 05/17/2019    CL 104 05/17/2019    CO2 21 05/17/2019    BUN 46 (H) 05/17/2019    CREAT 1.48 (H) 05/17/2019    GLUCOSE 88 05/17/2019    CALCIUM 8.9 05/17/2019    MG 1.3 (L) 05/11/2019    PHOS 5.6 (H) 05/17/2019     Lab Results   Component Value Date    APTT 61.1 (H) 05/17/2019    PT 15.7 (H) 05/17/2019    INR 1.3 05/17/2019     Lab Results   Component Value Date    ALT 16 05/14/2019    AST 37 05/14/2019    BILITOT 0.4 05/14/2019    ALKPHOS 202 (H) 05/14/2019    ALBUMIN 4.0 05/14/2019     Lab Results   Component Value Date    TSH 5.5 (H) 05/10/2019     Lab Results   Component Value Date    CHOL 77 (L) 01/18/2019    CHOLHDL 19 (L) 01/18/2019    TRIGLY 165 (H) 01/18/2019       MICRO:  Covid-19 PCR negative, 05/10/19    IMAGING:     TTE, 05/13/19:    CONCLUSIONS   1. Technically difficult study.   2. Normal left ventricular size.   3. Mild concentric left ventricular hypertrophy.   4. Left ventricular ejection fraction is approximately 60 to 65%.   5. Abnormal LV diastolic function (Grade I).   6. Moderate aortic valve stenosis with an aortic valve area of 1.15 cm? (index 0.67 cm?/m?).   7. Mild mitral valve regurgitation.   8. The peak TR velocity is not well defined, and therefore, pulmonary artery systolic pressure cannot be calculated. Based on the acceleration time in the RV outflow tract, the PA pressure is likely to be elevated.   9. There are no prior studies on this patient for comparison purposes.    L Shoulder x-ray, 05/13/19:  IMPRESSION:  1. No bone lesions.  2. Mild glenohumeral osteoarthritis. Anterior acromial spur, mild acromiohumeral narrowing and spur formation on the greater tuberosity suggest chronic rotator cuff disease.    Renal ultrasound, 05/11/19:  IMPRESSION:  ?  Mild right hydronephrosis, unchanged since prior.  Left pelviectasis.    Abdominal ultrasound, 05/11/19:  IMPRESSION:  ?  1. Marked splenomegaly.  Mild hepatomegaly.  ?  2. Borderline right hydronephrosis.  CXR, 05/10/19:  IMPRESSION:  ?  Stable cardiomediastinal silhouette with mildly tortuous, calcified thoracic aorta. Persistent mild cardiomegaly.  Normal lung volumes. Increased density of the bilateral lower lung zones is accentuated due to overlying breast implants.  There are bilateral lower lobe patchy consolidations, most suggestive of pulmonary edema versus pneumonia.   No right pleural effusion. Trace left pleural effusion. No pneumothorax. Elevation of the left hemidiaphragm  No acute osseous abnormalities.  ?  Pertinent pathology:  [place] Name: Lindsay Brennan, Lindsay Brennan Accession#: [id number] Med. Rec. # [id number] Taken: 10/30/2016 Encounter #: [id number] Received: 10/30/2016 Gender: F Reported: 11/04/2016 16:53 DOB: 02-Dec-1934 (Age: 39)  Submitting Physician: Hubert Azure D M.D.  Additional Physician(s):  [facility]  Clinical Information:  85 year old with remota history of mild pancytopenia in 2008. There ara no recent history or CBC studies.  Specimen Received:  A: Bone marrow,aspirate for clot section, pic,cyto sent to iu B: Bone marrow, biopsy (decalelfied),Ipic  ?  Final  Pathologic Diagnosis:  id B. Bone marrow, bio and aspirate for clot section, Jeft posterioriliac crest: Myeloid neoplasm. See note.  As the senior physician, ~ attest that I: (i) examined the relevant preparation(s) for the specimen(s); and (ii) rendered or confirmed the diagnosis(es).  ?Electronically Signed ngz1/10/30/2016 Jobie Quaker, M.D. (Pathologist)  Deanne Coffer, M.D. (Resident) Signing Location: [facility], [address], [address], [place], [place] [address]  Note:  The majordifferential diagnosis includes chronic myelomonocytic leukemia. Clinical correlation and correlation with cytogenetics/FISH study including BCR-ABL7 fusion study is recommended for further classification.  Dr. 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  ?  Cellularity: Markedly hypercellular (> 90%) Myslopoiesis: Markedly increased with full maturation Erythropoiesis: Decreased Megakaryopoiesis: Markedly increased with frequent hypolobated/monolobated megakaryocytes including small hyperchromatic forms Additional studles: Reticulin stain shows mild increasein reticulin fibrosis (1+) with appropriate control,  Theimmunohistechemical stains are performedin this tase and same of the antigens may also ba evaluated by flow cytometry, Concument immunchistechemieal stains on tissue sections are Indicated in order to correlate immunophenotype with cell morphology, evaluate spatial dletribution/architectural features and/or for a quantitation of disease involvement  Bone marrow,aspirate smear:  Sample quality: Adequate  ME ratio: Markedly increased, ~ 8:1 Blasts: Not increased (4%) ' Myelopoiesis: Markedly increased with occasional hypolobated neutrophils Erythropoiesis: Decreased with megaloblastoid changes Megakaryopoiesis: Markedly increased with frequent hypolobated/monolobated megakaryocytes Comments: No clusters of blasts, no Auer rods. Additional studies: Iron stain performed at the [facility] showsincreased storage iron with no ring sideroblasts. Control stains appropriately,  Bone marrow differential:  Percent  Blasts 2  Promyeltocytes 7  Myelocytes 23  Metamyelocytes 6  Bands 16  Polys 30  Lymphocytes 3  Monocytes 2  Eosinophils 4  Basophils 1  Nucleated RBCs 10  Peripheral blood, smear:  CBC: WBC 46 k/cumm, hemoglobIn 10,7 g/dL, MCV 81 fL, ROW 84.6%, platelet count 316 k/cumm Differential: Neutrophils 57%, lymphocytes 5%, monocytes 21%, myelocytas 3%, Metamyelocytes 8%, band 6%  RBCs: Normocytic anaemia with anlsopgikilocytosis. WEBCs: Leucocytosis, granulocytosis with left-shift, monocytosis. Platelets: Normal platelet caunt with occasional giantplatelets  ?  Results-Comments  CD34 and p53 stains show rare CD34+ blasts (<5%) and exceedingly rare p53+ cells. Controls stain appropriately.  ?  INTERERETATION: (600 cell diff)  ?  Bone Marrow Aspirate specimen is adequate and is markedly hypercellular. Spicules are present.  Megakaryocytes Are increaged in numberwith occasional small mono/hypolobated forms and rare small separated lobe forms identified. My elopoiesis Is abnormal, increased, and proceeds to completion. Blasts and promonocytes  comprise 6% of total nucleated cells. 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 The M/E ratio is 8.871.  Erythroid series Shows occasional megaloblastoid changes.  Lymphoid Lymphocytes comprise 2% of total nucleated cells,  Comments: Correlate with bone marrow biopsy and cytogenetic studies,  Tron Stain Results: +2/6 Iron in reticulum, no ringed formsidentified.  Control: positive.  IMPRESSION: DATESIGNED OUT: 11/01/2016 TIME: 10:10 CMML-1.  COMMENT: The marrow biopsy was not available at the time of finalizing the aspirate report. In the event of a marrow biopsy report with discrepant results an addendum will be provided by [facility].  ?  My blast count was 8% ( 300 cell differential) with 15% monocytes; trilineage dysplasia; increased numbers of megakaryocytes and 90-100% cellular particles.  Becauseofthe high WBC I did my counting very close to the particles to void any dilution effect from the peripheral blood.  1% blasts in PBS and 19% monocytes.  JMB    ASSESSMENT/PLAN:   Lindsay Brennan is a 85 y.o. 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  ?  #Symptomatic Hyperleukocytosis   #History of chronic myelomonocytic leukemia type-1  Patient's lightheadedness and SOB may be secondary to WBC >100k, though other than elevated Cr no e/o end-organ damage. Blasts <5% (4% on admission) making blast crisis less likely, though patient declining bone marrow bx to determine extent of blast cells in bone marrow. Patient has not received treatment previously, and notably CMML is not driven by BCR-ABL mutation. Evaluated by hematology/oncology who recommend hydroxyurea (dose-reduced for eGFR <60); if no improvement in WBC or worsening in symptoms will consider leukapheresis. Toxicities of hypo-methylating agents (treatment of choice in MDS/CMML) likely outweigh benefits in this 85yo patient, and regardless patient hopes to return to [place] for consideration of more aggressive treatment once she is stabilized. 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 Of note, no e/o of infection to explain marked increase in WBC from prior baseline (previously 45k in October 2020), though will have low threshold to start antibiotics, noting PCN allergy (hives). Elevated procalcitonin (1.12) difficult to interpret in setting of high circulating cytokines from heme malignancy. Leukocytosis continues to improve. Will continue to monitor TLS labs, especially uric acid prior to discharge.   []  heme/onc consulted, appreciate recs:  - hydroxyurea started 4/6 --> increased to 1g BID on 4/9 given persistently elevated WBC count (>100k) and improving GFR --> decrease to 1g qAM, 500mg  qPM (4/12-) given improvement in WBC count  -Trend TLS labs (uric acid, phos, K, iCa) q12h  -Trend DIC labs (INR, PTT, fibrinogen, D-dimer, platelets) q12h  --> maintain platelets >30lk  --> maintain fibrinogen >100  - increase mIVF from 75 to 100cc/hr   --> IV Lasix 20mg  prn if worsening oxygenation or high positive fluid balance on strict I/Os  - continue allopurinol 50mg  qday (this is max dose per pharmacy given AKI and age, but to follow up with pharmacy again tomorrow with repeat kidney function test)  -Follow-up hematology malignancy panel  -Follow-up peripheral blood flow cytometry  []  f/u with Dr. Lennon Alstrom with CBC beforehand (prior to transitioning back to care under Dr. Silvio Clayman in [place]).    #Acute hypoxic respiratory  failure, resolved:  #HFpEF, new diagnosis:  BNP elevated on admission (532) with TTE on 05/13/19 showing grade I diastolic dysfunction. Hypoxia on presentation likely 2/2 volume overload given improvement with IV Lasix.   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 Suspect hypervolemia due to renal insufficiency. BNP mildly elevated. No acid/base disorder noted on 4/5 VBG. UA without significant pyuria. L pelviectasis on renal ultrasound but per renal this is likely sequelae of prior nephrolithiasis. UMA 377mg /g.  []  renal consulted, appreciate recs:  - lisinopril restarted 4/8 given Cr stable and BP persistently elevated, for long-term benefit in CKD --> change to losartan as below  - trend Cr daily  - allopurinol 50mg  qday     #Lightheadness, POA, improving:  #Moderate Aortic Stenosis, new diagnosis:  Symptoms likely due to hyperluekocytosis, though query component of medication adverse effect (clonidine and diltiazem) as well as deconditioning. TTE on 05/13/19 showing moderate AS, though unlikely to be cause of symptoms.  []  decrease home diltiazem as below and discontinue clonidine 0.1mg  TID  []  fall precautions --> junior walker ordered per PT recs  []  PT/OT following  []  repeat TTE April 2022 to assess for progression of AS  ?  #Elevated INR, POA, improving:  INR 1.9 on admission. This raises c/f synthetic liver dysfunction due to leukostasis or DIC, which can occur in up to 40% of patients with hyperleukocytosis. Per heme/onc, high INR likely due to factor X deficiency from factor X binding to atypical monocytes. Reassuringly, other DIC labs WNL (other than D-dimer), and other liver labs WNL (including albumin at 3.5). Consider nutritional deficiency given recent weigh loss, or consumptive coagulopathy given splenomegaly. INR now improving.  []  CTM, will consider vit K administration if increasing INR, after discussion with heme/onc  ?  #Anemia, POA, worsening: Hgb 8.9 on admission --> 7.7 on 4/10. Chronic anemia likely 2/2 renal disease and CMML. Iron studies not c/w iron deficiency anemia (ferritin 640, 28% transferrin saturation). Will give epo given Hb <8.0 (noting worsening anemia as possible adverse effect of hydroxyurea).  - trend CBC daily  - continue home iron supplementation (given CKD and on epo)  ?  #Cough, chronic  May be secondary to lisinopril given onset concomitant with initiation of that medication. However, patient today notes several months of chronic cough of unclear etiology. Consider allergy related.   - further outpatient workup  - guaifenesin prn    #Hypertension:   Initially held home diltiazem 180mg  and clonidine 0.1mg  TID given possibly contributing to lightheadedness and dizziness. Blood pressure has improved with below regimen and she reports no further episodes of dizziness.   - continue losartan 50mg  po qday lisinopril 20mg  to losartan 50mg  given new cough  - continue diltiazem 120mg  qday  at lower dose (home dose is 180mg )   - hydral 25mg  PO q6h prn for SBP >180 and/or DBP >110    #L shoulder pain, chronic but worsening:  X-ray suggestive of chronic rotator cuff disease, however exam also with TTP along L trapezius suggestive of myofascial pain syndrome.   []  [place] consulted, now s/p trigger point injection on 4/8 with improvement in symptoms  - diclofenac 1% gel prn  - lidocaine patch prn  - acetaminophen RTC    Chronic/Stable Medical Problems  #Subclinical hypothyrodism: TSH 5.2 on admission  -Continue levothyroxine , on alternating  days   ?  #HLD:   -Continue rosuvastatin?20mg   ?  #Peripheral neuropathy, POA:  -Continue gabapentin but dose reduce to 100mg  at bedtime (from 200mg ) given worsening kidney function and dizziness  ?  Inpatient Checklist  Diet: renal (low K, low phos)  GI ppx: home PPI  DVT ppx: heparin subq  Lines: pIV  Tubes/Drains: none  ?  CODE STATUS: Full Code  Primary Emergency Contact: Margot Ables [phone])  ?  Disposition:  Home with home health   ?  Ambulatory Status & Fall Risk  [] ? non-ambulatory  [] ? walks with cane and/or walker  [x] ? walks independently  [] ? walks independently but may benefit from an assistive device evaluation with physical therapy  [] ? needs supervision with walking secondary to poor safety awareness  ?  Discharge Considerations:  [x] ? recommend PT evaluation to see if appropriate for skilled nursing facility   [x] ? recommend increase level of care/supervision then patient currently has at home  [] ? recommend 24 hour supervision  [] ? recommend home health services  [] ? after stabilization patient may return to prior living situation as it is adequate to meet his/her needs  [] ? recommend new assistive device for ambulation, safety, and fall risk  [x] ? will re-evaluate later in the hospital course once further stabilized    This patient has an advanced directive:  Yes []   No [x]  Unknown []     This patient's designated decision maker if or when patient lacks capacity is: Daughter Kim  Code Status (designated by patient): Full Code    Discussed with attending, Dr. Alba Cory    Salome Spotted, DO   PGY3 Internal Medicine       Iantha Fallen. Dorismar Chay, 05/17/2019

## 2019-05-17 NOTE — Nursing Note
0800 Awake, alert and oriented. Denies pain. IV fluids running. On RA, SPO2 at 98%. Daughter Maudie Mercury at bedside. Vitals WNL.     6712 Transferred to recliner chair. IV fluids running. Labs drawn. AM meds given. Call light within reach and alarm on.     1200 Ambulated down the hallway. SPO2 maintained at 99%. IV fluids increased to 100 cc/hr.    1700 Up to chair. No further needs. PM labs drawn.     1800 Patient noted bruising on her left breast. Assured patient and daughter that it will be communicated to the geri team to assess tomorrow.

## 2019-05-18 ENCOUNTER — Telehealth: Payer: MEDICARE

## 2019-05-18 LAB — CBC
MCH CONCENTRATION: 30.8 g/dL — ABNORMAL LOW (ref 31.5–35.5)
NEUTROPHILS ABS (PRELIM): 10.73 10*3/uL (ref 26.4–33.4)

## 2019-05-18 LAB — Basic Metabolic Panel
ANION GAP: 11 mmol/L (ref 8–19)
CREATININE: 1.44 mg/dL — ABNORMAL HIGH (ref 0.60–1.30)
POTASSIUM: 4.5 mmol/L (ref 3.6–5.3)
POTASSIUM: 4.5 mmol/L (ref 3.6–5.3)

## 2019-05-18 LAB — Prothrombin Time Panel
INR: 1.3 s (ref 11.5–14.4)
PROTHROMBIN TIME: 15.5 s — ABNORMAL HIGH (ref 11.5–14.4)

## 2019-05-18 LAB — Phosphorus
PHOSPHORUS: 5.6 mg/dL — ABNORMAL HIGH (ref 2.3–4.4)
PHOSPHORUS: 5.5 mg/dL — ABNORMAL HIGH (ref 2.3–4.4)

## 2019-05-18 LAB — Differential Automated
BASOPHIL PERCENT, AUTO: 1.8 (ref 0.00–0.10)
NEUTROPHIL PERCENT, AUTO: 68.3 (ref 0.00–0.50)

## 2019-05-18 LAB — D-Dimer
D-DIMER STAGO: 2.16 ug{FEU}/mL — ABNORMAL HIGH (ref <0.60)
D-DIMER STAGO: 2.1 ug{FEU}/mL — ABNORMAL HIGH (ref <0.60)

## 2019-05-18 LAB — APTT
APTT: 52 s — ABNORMAL HIGH (ref 24.4–36.2)
APTT: 63.6 s — ABNORMAL HIGH (ref 24.4–36.2)

## 2019-05-18 LAB — Uric Acid
URIC ACID: 8.5 mg/dL — ABNORMAL HIGH (ref 2.9–7.0)
URIC ACID: 8.7 mg/dL — ABNORMAL HIGH (ref 2.9–7.0)

## 2019-05-18 LAB — Fibrinogen
FIBRINOGEN: 218 mg/dL — ABNORMAL LOW (ref 235–490)
FIBRINOGEN: 210 mg/dL — ABNORMAL LOW (ref 235–490)

## 2019-05-18 LAB — Calcium,Ionized
IONIZED CA++,CORRECTED: 1.15 mmol/L (ref 1.09–1.29)
IONIZED CA++,CORRECTED: 1.18 mmol/L (ref 1.09–1.29)

## 2019-05-18 MED ORDER — LORATADINE 10 MG PO TABS
5 mg | ORAL_TABLET | Freq: Every day | ORAL | 0 refills | 30.00 days | Status: AC
Start: 2019-05-18 — End: ?
  Filled 2019-05-19: qty 15, 30d supply, fill #0

## 2019-05-18 MED ORDER — GABAPENTIN 100 MG PO CAPS
200 mg | ORAL_CAPSULE | Freq: Every evening | ORAL | 0 refills | 45 days | Status: AC
Start: 2019-05-18 — End: ?

## 2019-05-18 MED ORDER — DICLOFENAC SODIUM 1 % EX GEL
2 g | Freq: Four times a day (QID) | TOPICAL | 0 refills | 30.00 days | Status: AC | PRN
Start: 2019-05-18 — End: ?

## 2019-05-18 MED ORDER — HYDROXYUREA 500 MG PO CAPS
1000 mg | ORAL_CAPSULE | Freq: Every morning | ORAL | 0 refills | 30.00 days | Status: AC
Start: 2019-05-18 — End: ?
  Filled 2019-05-19: qty 90, 30d supply, fill #0

## 2019-05-18 MED ORDER — DILTIAZEM HCL ER COATED BEADS 120 MG PO CP24
120 mg | ORAL_CAPSULE | Freq: Every day | ORAL | 0 refills | 90.00 days | Status: AC
Start: 2019-05-18 — End: ?
  Filled 2019-05-19: qty 90, 90d supply, fill #0

## 2019-05-18 MED ORDER — GUAIFENESIN 100 MG/5ML PO SYRP
100 mg | Freq: Four times a day (QID) | ORAL | 0 refills | 9.00 days | Status: AC | PRN
Start: 2019-05-18 — End: ?
  Filled 2019-05-19: qty 180, 9d supply, fill #0

## 2019-05-18 MED ORDER — HYDROXYUREA 500 MG PO CAPS
500 mg | ORAL_CAPSULE | Freq: Every evening | ORAL | 0 refills | 90 days | Status: AC
Start: 2019-05-18 — End: ?

## 2019-05-18 MED ORDER — ALLOPURINOL 100 MG PO TABS
50 mg | ORAL_TABLET | Freq: Every day | ORAL | 0 refills | 90.00 days | Status: AC
Start: 2019-05-18 — End: ?
  Filled 2019-05-19: qty 45, 90d supply, fill #0

## 2019-05-18 MED ORDER — EPOETIN ALFA-EPBX 10000 UNIT/ML IJ SOLN
150 [IU]/kg | SUBCUTANEOUS | 0 refills | 63 days | Status: AC
Start: 2019-05-18 — End: ?

## 2019-05-18 MED ORDER — LOSARTAN POTASSIUM 50 MG PO TABS
50 mg | ORAL_TABLET | Freq: Every day | ORAL | 0 refills | 90.00 days | Status: AC
Start: 2019-05-18 — End: ?
  Filled 2019-05-19: qty 90, 90d supply, fill #0

## 2019-05-18 MED ADMIN — HYDROXYUREA 500 MG PO CAPS: 500 mg | ORAL | @ 01:00:00 | Stop: 2019-06-17 | NDC 00904693961

## 2019-05-18 MED ADMIN — PANTOPRAZOLE SODIUM 40 MG PO TBEC: 40 mg | ORAL | @ 16:00:00 | Stop: 2019-05-19 | NDC 50268063915

## 2019-05-18 MED ADMIN — LACTATED RINGERS IV SOLN: 100 mL/h | INTRAVENOUS | @ 03:00:00 | Stop: 2019-05-18 | NDC 00338011704

## 2019-05-18 MED ADMIN — GABAPENTIN 100 MG PO CAPS: 100 mg | ORAL | @ 03:00:00 | Stop: 2019-05-19 | NDC 60687058001

## 2019-05-18 MED ADMIN — MAGNESIUM OXIDE 400 (240-241.3 MG) MG (MULTI-GPI) PO TABS: 400 mg | ORAL | @ 16:00:00 | Stop: 2019-05-19 | NDC 64980033901

## 2019-05-18 MED ADMIN — EPOETIN ALFA-EPBX 10000 UNIT/ML IJ SOLN: 7300 [IU] | SUBCUTANEOUS | @ 16:00:00 | Stop: 2019-05-19 | NDC 00069130810

## 2019-05-18 MED ADMIN — HYDROXYUREA 500 MG PO CAPS: 1000 mg | ORAL | @ 16:00:00 | Stop: 2019-05-19 | NDC 00904693961

## 2019-05-18 MED ADMIN — ACETAMINOPHEN 325 MG PO TABS: 650 mg | ORAL | @ 06:00:00 | Stop: 2019-05-19 | NDC 50580045811

## 2019-05-18 MED ADMIN — LEVOTHYROXINE SODIUM 50 MCG PO TABS: 50 ug | ORAL | @ 13:00:00 | Stop: 2019-05-19 | NDC 51079044020

## 2019-05-18 MED ADMIN — LORATADINE 5 MG PO TABS: 5 mg | ORAL | @ 16:00:00 | Stop: 2019-05-19 | NDC 45802065078

## 2019-05-18 MED ADMIN — LIDOCAINE 5 % EX PTCH: 1 | TRANSDERMAL | @ 16:00:00 | Stop: 2019-05-19 | NDC 00591352530

## 2019-05-18 MED ADMIN — LACTATED RINGERS IV SOLN: 100 mL/h | INTRAVENOUS | @ 13:00:00 | Stop: 2019-05-18 | NDC 00338011704

## 2019-05-18 MED ADMIN — DILTIAZEM HCL ER COATED BEADS 120 MG PO CP24: 120 mg | ORAL | @ 16:00:00 | Stop: 2019-05-19 | NDC 60687019501

## 2019-05-18 MED ADMIN — LIDOCAINE 5 % EX PTCH: 1 | TRANSDERMAL | @ 03:00:00 | Stop: 2019-05-19

## 2019-05-18 MED ADMIN — ACETAMINOPHEN 325 MG PO TABS: 650 mg | ORAL | @ 01:00:00 | Stop: 2019-05-19 | NDC 50580045811

## 2019-05-18 MED ADMIN — MELATONIN 3 MG PO TABS: 3 mg | ORAL | @ 06:00:00 | Stop: 2019-05-19

## 2019-05-18 MED ADMIN — ACETAMINOPHEN 325 MG PO TABS: 650 mg | ORAL | @ 19:00:00 | Stop: 2019-05-19 | NDC 50580045811

## 2019-05-18 MED ADMIN — ACETAMINOPHEN 325 MG PO TABS: 650 mg | ORAL | @ 13:00:00 | Stop: 2019-05-19 | NDC 50580045811

## 2019-05-18 MED ADMIN — ASPIRIN 81 MG PO CHEW: 81 mg | ORAL | @ 16:00:00 | Stop: 2019-05-19 | NDC 66553000201

## 2019-05-18 MED ADMIN — HEPARIN SODIUM (PORCINE) 5000 UNIT/ML IJ SOLN: 5000 [IU] | SUBCUTANEOUS | @ 16:00:00 | Stop: 2019-05-19 | NDC 00641040012

## 2019-05-18 MED ADMIN — HEPARIN SODIUM (PORCINE) 5000 UNIT/ML IJ SOLN: 5000 [IU] | SUBCUTANEOUS | @ 03:00:00 | Stop: 2019-05-19 | NDC 00641040012

## 2019-05-18 MED ADMIN — ROSUVASTATIN CALCIUM 20 MG PO TABS: 20 mg | ORAL | @ 06:00:00 | Stop: 2019-05-19 | NDC 60687024501

## 2019-05-18 MED ADMIN — LOSARTAN POTASSIUM 50 MG PO TABS: 50 mg | ORAL | @ 16:00:00 | Stop: 2019-05-19 | NDC 68084034701

## 2019-05-18 MED ADMIN — ALLOPURINOL 50 MG PO TABS: 50 mg | ORAL | @ 16:00:00 | Stop: 2019-05-19 | NDC 00603211521

## 2019-05-18 NOTE — Nursing Note
Patient discharged home with daughter at bedside, DC meds still over [address], daughter okay to go over and pick up the meds. Pharmacist called and notified about patients coming to pick up her meds. DC papers given and instructions given. Patient and daughter advised to call home health number to confirm epoetin injection administration this coming Thursday. HL removed , belongings papers signed and patient taken by wheelchair

## 2019-05-18 NOTE — Telephone Encounter
Patient Lakaya Tolen [id number] has been scheduled for    Doctor patients Daughter Joelene Millin stated someone mention having her mother see an internist not a family Medicine doctor for her primary care  . Patient is 85 years old  to be seen at with Geriatrics  patient has to be 5 or older. Do you have any  recommendations on who patient should establish care with? Thank you       Pcp Dr. Tawanna Sat on 05/24/2019 at 11:00 [place] , [place]  [phone]  Wants to establish with care with another provider     Nepr Dr. Kathrynn Speed on 06/01/2019 at 10:00  [address] [place], [place]  [phone]  Patient was established with Dr. Roxanne Mins [place]  Per daugther establish care with new Neph     Hem Dr. Loreli Slot on 06/07/2019 at 10:45   [place], [place]  [phone]  Spoke with [facility] first available placed on cancelation list/ message Dr. Kelton Pillar       Called patients daugther Joelene Millin at [phone]. Confirmed appointments. Mailed letters.

## 2019-05-18 NOTE — Progress Notes
Physical Therapy  Discharge Summary    PATIENT: Lindsay Brennan  MRN: [id number]  DOB: 11/24/1934      Date:  05/17/2019   Therapist: Elmyra Ricks ASADA, PT     Reviewed Treatment Plan, Progress and Goals with: PTA    Patient has been seen for:  Bed mobility training;Transfer training;Gait training    Objective     See Daily Progress Notes for functional levels     Patient showing progress in: Bed mobility training;Transfer training;Gait training    Assessment     Goals met: Yes         Comment: Patient has been routinely ambulating supervised around unit today, no safety concerns per nursing. Acute PT no longer indicated--plan to DC PT and patient to ambulate w/ nursing until DC.      Continue present treatment plan: No              Updated Discharge Recommendations:  Discharge Recommendation: Physical Therapy;1-3 x/week  Type of therapy: Home Health  Supervision Recommended on Discharge: <8 hr/day;Initially, wean supervision as tolerated  Discharge concerns: None noted(to stay with daughter initially)  Discharge Equipment Recommended: Junior;Walker  Equipment ordered: DME delivered

## 2019-05-18 NOTE — Consults
SPRITUAL CARE CONSULTATION NOTE    PATIENT:  Emmersen Garraway  MRN:  [id number]     Patient Info        Religious/Spiritual Identity:        Mina Marble       Last Anointed Date:                 Baptised:                 Spiritual Care Visit Details              Date of Visit:  05/18/19  Time of Visit:  13  Visited with Patient, Family (Specify)(Pt's daughter, Maudie Mercury)   Visit length 15 Minutes   Referral source Other Chaplain   Reason for visit Follow-up/routine visit      Spiritual Assessment     Spiritual practices & resources Family/Friends, Personal faith/Spiritual beliefs, Prayer   Areas of spiritual/emotional distress Adjustment to illness/hospitalization, Concerns for health and healing, Feelings of ...   Distressful feelings Feelings of frustration/discouragement(Pt expressed disappointment in not going home today)   Indicators of spiritual wellbeing Demonstrates resilience, Expresses..., Able to receive love and support   Expressions of spiritual wellbeing Expresses hope, Expresses desire to get well      Plan     Spiritual care intervention Explored feelings related to present illness, Addressed emotional concerns/distress, Blessing offered   Outcomes (per patient/family) Appreciated visit   Spiritual care plans Continue to visit as needed   Additional comments .      Recommendation          Author:  Santina Evans 05/18/2019 1:07 PM  Contact info: SM pager: [phone] ext: [phone]

## 2019-05-18 NOTE — Other
Patients Clinical Goal:   Clinical Goal(s) for the Shift: safety, sleep/comfort, pain management  Identify possible barriers to advancing the care plan: none  Stability of the patient: Moderately Stable - low risk of patient condition declining or worsening   End of Shift Summary:   Patient a/ox4 slept well during the night. Denies any pain or discomfort but continues on Tylenol to prevent shoulder pain. Per report pt and daughter concerned r/t bruise to L breast and neck. IVF LR at 100cc/hr. Pending discharge home today. DME Eulah Pont at bedside. Will endorse care to oncoming shift.   GERIATRICS END OF SHIFT - DISCHARGE MARKERS of Instability Checklist  Nursing assessment:     Indicator   Cutoff Check if indicator needs to be addressed (meets cutoff)   Situation/Action/Comment   O2 requirement New since admission []     PO intake Less than 50% []     Urination None in past 8 hours/last shift []     Foley New since admission []     Pain Present  []     Bowel movements  <1 in 2 days or >3 in 24 hours [x]  LBM 4/12   Delirium Unable to follow simple commands or participate with PT/OT []     Mobility Unable to stand and/or walk to bathroom [x]  OOB to chair 2 times.  Ambulated 900 feet.       BOOST / SAFE TRANSITION - DISCHARGE INDICATORS    Indicator Check if indicator has red ''P''    Situation/Action/Comment     Problems with Medications  []     Psychological  []     Principal Diagnosis  []     Physical Limitations  []     Poor Health Literacy  []     Patient Support  []     Prior Hospitalizations  [x]     Palliative Care  []       DELIRIUM PREVENTION  Protocols Strategies Check if implemented Comments   Risk factors >3 present- High risk []     Purposeful orientation Reorient, purposeful orienting conversation  Familiar objects in room []   []     Therapeutic activities Volunteer visit  Cognitive stimulation activities: games, reading, music []   []     Vision & hearing Assistance with: Leisure centre manager  Assistance with: hearing aids/hearing amplifier []   []     Feeding & hydration Assistance with feeding  Assistance with dentures []   []     Sleep hygiene Shades/blinds/lights on during day, limit naps during day  Quiet environment, consolidate care []     Mobilization BMAT 3-4: Ambulate TID  BMAT 2: OOB daily to chair for meals ? 2 hours, each time  BMAT 1: OOB to cardiac chair daily for meals ? 2 hours, each time []   []   []     Pain Non-narcotics ATC  Non-pharmacological: oil/aromatherapy, massage, music []   []     Maintain safety Fall precautions, volunteer visit, family at bedside, tele sitter, constant observer []     Manage agitation Redirect with calm, gentle voice and avoid confrontation  Avoid restraints and use alternative to restraints  Doll, music or animal therapy, as appropriate  Volunteer for companionship if safe and appropriate []   []   []   []       DELIRIUM: CAM (+)  Protocols Strategies Check if implemented Comments   New-onset MD contacted  Delirium order-set initiated  Bladder scan to R/O retention  Assess stool impaction  Medication reviewed with pharmacist []   []   []   []   []  MD Name:   Existing Manage and prevent further  delirium []

## 2019-05-18 NOTE — Telephone Encounter
If patient can arrive at 8:30am on 4/23 it will be ok to overbook her then.     Thank you

## 2019-05-18 NOTE — Progress Notes
RENAL CONSULTATION INPATIENT  Referring Physician: Alba Cory   CC: CKD    Interval   05/17/2019 patient had an increased cough so lisinopril was changed to losartan and diltiazem was re-added. She has no complaints today     4/10 no complains, shoulder feeling better     4/9 ongoing shoulder pain; Received trigger point injections     4/8 complains of shoulder pain on the right which is overshadowing the dizziness    4/7:  BP elevated. Started hydroxyurea    History of Present Illness  85 y.o. F with proteinuric CKD admitted for dizziness and shortness of breath, found to have hyperleukocytosis.     Over the past few years the creatinine has gradually trended up into the 2s. A few years ago a nephrologist in [place] (her regular nephrologist) advised her to stop lisinopril in the setting of getting a colonoscopy, ''because the kidney disease is very advanced''. She had undergone a renal artery angiogram which showed nonobstructive atherosclerosis. There is mention of rapidly progressive kidney disease but the records I was able to see in care everywhere show a more slow rise in creatinine over the past few years.     She has been visiting her daughter [name] (who is at the bedside) for the past several months and during this time has been following the CKD clinic in [place]. They recently started her on diltiazem for the proteinuria which correlated with onset of the dizziness, though the dilt was reduced, and then stopped prior to this admission, but she does not feel any better.   She does report a remote hx of kidney stones assoc with renal colic. However there have been no recent events of this.   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   She has a hx of HTN     Medications  Allergies   Allergen Reactions   ? Coffee      Reported to RD   ? Oxycodone-Acetaminophen Other (See Comments)   ? Penicillins Hives   ? Sulfa Antibiotics Agranulocytosis   ? Naproxen Rash     Aleve     ? acetaminophen 650 mg Oral Q6H   ? [START ON 05/18/2019] allopurinol  50 mg Oral Daily   ? aspirin  81 mg Oral Daily   ? dilTIAZem  120 mg Oral Daily   ? epoetin alfa-epbx  150 Units/kg Subcutaneous Once per day on Tue Thu Sat   ? ferrous sulfate  325 mg Oral Every Other Day   ? gabapentin  100 mg Oral QHS   ? heparin  5,000 Units Subcutaneous Q12H   ? [START ON 05/18/2019] hydroxyurea  1,000 mg Oral Daily   ? hydroxyurea  500 mg Oral QPM   ? levothyroxine  50 mcg Oral Every Other Day   ? levothyroxine  75 mcg Oral Every Other Day   ? lidocaine  1 patch Transdermal Q24H   ? losartan  50 mg Oral Daily   ? magnesium oxide  400 mg Oral Daily   ? pantoprazole  40 mg Oral Daily   ? rosuvastatin  20 mg Oral QHS     analgesic balm, bisacodyl, diclofenac Sodium, guaiFENesin, hydrALAZINE, lidocaine PF, magnesium hydroxide, melatonin, senna    EXAM  BP 176/79  ~ Pulse 98  ~ Temp 36.1 ?C (96.9 ?F) (Oral)  ~ Resp 18  ~ Ht 1.549 m (5' 1'')  ~ Wt 47.4 kg (104 lb 8 oz)  ~  SpO2 98%  ~ BMI 19.75 kg/m?     Intake/Output Summary (Last 24 hours) at 05/17/2019 2154  Last data filed at 05/17/2019 2007  Gross per 24 hour   Intake 2096.67 ml   Output 1750 ml   Net 346.67 ml     General: NAD, well-developed  Eyes: Conjunctiva and lids normal; Pupils equal, Anicteric  ENMT: normal facial anatomy with no traumatic lesions; normal mucosa, no discharge  Neck: supple, trachea midline, no thyromegaly  Respiratory: Normal work of breathing, CTAB  Cardiovascular: regular, no added heart sounds; JVP normal; no edema  Abdomen: soft, NT/ND, no HSM  Vascular: symmetric 2+ pedal pulses; no ulcerations  Skin: warm and dry; normal coloration  Neuro: CN intact; moves all ext  Psych: alert, oriented; normal affect; appropriate judgement and insight    4/10: seated comfortably in bedside chair.     LABS  Lab Results   Component Value Date    CREAT 1.50 (H) 05/17/2019    BUN 47 (H) 05/17/2019    NA 134 (L) 05/17/2019    K 4.5 05/17/2019    CL 101 05/17/2019    CO2 21 05/17/2019 ASSESSMENT  85 y.o. delightful female with CKD of unknown cause, characterized by slow progression, subnephrotic proteinuria, and HTN. Admitted for dizziness likely due to hyperleukocytosis.     1. CKD stage 4 with proteinuria and intermittent microhematuria, mild bump in creat v fluctuations due to leukostasis. Now returning to baseline   2. 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 Coagulopathy   4. Hyponatremia mild   5. R hydro mild ? Significance. Not likely causing AKI. Could be sequella from prior stone   6. HTN  7. Prior kidney stones (Seen R side ultrasound 2020, per report) - treated with allopurinol for a time now off        RECOMMENDATIONS  Am ok to losartan + dilt - cont same.  Check BMP in AM   followup in nephrology fellow clinic 2-4 weeks post dc     Elevated risk of recurrent AKI:  - Avoid nephrotoxic injury  - Dose all medications to current eGFR      Clementeen Graham, MD

## 2019-05-18 NOTE — Progress Notes
GERIATRICS PRE-DISCHARGE CHECKLIST      Name:  Lindsay Brennan  MRN: [id number]  DOB: January 06, 1935    Date of Admit: 05/10/2019    Date of Service: 05/18/2019    Primary Care Provider:  Salley Slaughter, MD  Attending Physician: Alba Cory, MD, PhD    During this admission, was the patient's discharge from the hospital previously postponed due to a positive marker?   [x]  No     []  Yes   If yes, state which positive marker:    I. MARKERS OF INSTABILITY (within 24 hours of discharge)      Vitals  Maker Cutoff Check if meets cutoff and take action Action/Comment   Temp:  [36.1 ?C (96.9 ?F)-36.6 ?C (97.8 ?F)] 36.2 ?C (97.2 ?F) T > 38C  []     BP: (124-176)/(71-82) 154/82 SBP < 90 and/or >180  []     Resp:  [16-19] 16 RR >20  []     Heart Rate:  [78-109] 94 HR < 50 and/or  >100  []     SpO2:  [98 %-100 %] 98 % SpO2 <92%  []       Laboratory tests  Marker Cutoff Check if meets cutoff and take action Action/Comment   Lab Results   Component Value Date    CREAT 1.44 (H) 05/18/2019      Creatinine > 2  []     Glucose range: n/a Glucose > or = to 300 within   24H period  []     Lab Results   Component Value Date    NA 138 05/18/2019    Na < 130  []     Lab Results   Component Value Date    K 4.3 05/18/2019    K > 5  []     Lab Results   Component Value Date    WBC 15.64 (H) 05/18/2019    WBC > 12  [x]  History of chronic myelomonocytic leukemia type-1   Lab Results   Component Value Date    HGB 7.3 (L) 05/18/2019    HGB 7.7 (L) 05/17/2019    HGB 7.4 (L) 05/17/2019    Hgb < 9 and/or Hgb down trend by 2 within 48 hours  [x]  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 NURSING ASSESSMENT of core measures         [x]  NP/MD reviewed nursing assessment    []  Action needed (please list below)    [x]  No action needed    Complete below if nursing assessment not available for review.  Marker Cutoff Check if meets cutoff and take action Action/Comment   O2 requirement New since admission  []     PO intake Less than 50%  []     Urination None in past 8 hours/last shift  []     Foley  New since admission   []     Pain Present    []     Bowel movements  <1 in 3 days or >3 in 24 hours  []     Delirium Unable to follow simple commands or participate with PT/OT  []     Mobility Unable to stand and/or walk to bathroom  []       If an intervention was made today prior to discharge based on a nursing assessment, please list the intervention:    III. Pharmacy  Anticoagulation: Is patient  going Home On Warfarin/Lovenox/Noacs  Marker YES/NO New Coumadin Referral to [facility]? Action/Comments   New Coumadin?  Yes []   No  [x]   Yes []   No  [x]     New Lovenox?  Yes []   No  [x]      New Noacs?  Yes []   No  [x]       If anticoagulation (lovenox/noacs) is newly initiated in the hospital, has authorization been secured and a plan for bedside delivery been identified?    IV. SOCIAL WORK ASSESSMENT   Was a social issue identified at the time of admission?   [x]  No     []  Yes      Check box if an identified issue   Issue    []  Family / patient disagree with medical team about plan of care     []  Insufficient care at home.    []  Caregiver unable to provide adequate care / make appropriate decisions    []  Readmission in anyway related to insufficient care plan at home or insufficient care at home     If an issue was identified, did the Child psychotherapist do an intervention?   []  No     []  Yes    If an intervention was made by the social worker prior to discharge, please list the intervention or refer to the date of the note documenting the intervention:        Alba Cory, MD, PhD, 05/18/2019

## 2019-05-18 NOTE — Progress Notes
Pharmaceutical Services  Discharge Medication Reconciliation Note    Patient Name: Lindsay Brennan  Medical Record Number: [id number]  Admit date: 05/10/2019 5:49 PM    Age: 85 y.o.  Sex: female  Allergies: Coffee, Oxycodone-acetaminophen, Penicillins, Sulfa antibiotics, and Naproxen    Discharge Medication List:     Changes To My Medications        START taking these medications        Dose Instructions   aspirin 81 mg chewable tablet  Replaces: aspirin 81 mg EC tablet   81 mg   Chew 1 tablet (81 mg total) by mouth daily.     diclofenac Sodium 1% gel  Commonly known as: Voltaren   2 g   Apply 2 g topically every six (6) hours as needed (for L shoulder pain).     guaiFENesin 100 mg/5 mL syrup  Commonly known as: Robitussin   100 mg   Take 5 mLs (100 mg total) by mouth every six (6) hours as needed.     * hydroxyurea 500 mg capsule  Commonly known as: Hydrea   500 mg   Take 1 capsule (500 mg total) by mouth every evening.     * hydroxyurea 500 mg capsule  Commonly known as: Hydrea  Start taking on: May 19, 2019   1,000 mg   Take 2 capsules (1,000 mg total) by mouth every morning.     loratadine 10 mg tablet  Commonly known as: Claritin  Start taking on: May 19, 2019   5 mg   Take ONE-HALF tablets (5 mg total) by mouth daily.     losartan 50 mg tablet  Commonly known as: Cozaar  Start taking on: May 19, 2019   50 mg   Take 1 tablet (50 mg total) by mouth daily.           * This list has 2 medication(s) that are the same as other medications prescribed for you. Read the directions carefully, and ask your doctor or other care provider to review them with you.                CHANGE how you take these medications        Dose Instructions   allopurinol 100 mg tablet  Commonly known as: Zyloprim  Start taking on: May 19, 2019  What changed:   how much to take  when to take this   50 mg   Take 1/2 tablets (50 mg total) by mouth daily.     dilTIAZem 120 mg 24 hr capsule  Commonly known as: Cardizem CD  Start taking on: May 19, 2019  What changed:   medication strength  how much to take   120 mg   Take 1 capsule (120 mg total) by mouth daily.     epoetin alfa-epbx 10,000 units/mL injection  Commonly known as: Retacrit  Start taking on: May 21, 2019  What changed:   how much to take  when to take this  additional instructions  These instructions start on May 21, 2019. If you are unsure what to do until then, ask your doctor or other care provider.   150 Units/kg   Inject 0.73 mLs (7,300 Units total) under the skin three (3) times a week.            CONTINUE taking these medications        Dose Instructions   esomeprazole 40 mg capsule  Commonly known as: NexIUM   40  mg   Take 40 mg by mouth daily .     Ferric Citrate 1 GM 210 MG(Fe) Tabs   1 tablet   Take 1 tablet by mouth three (3) times daily with meals.     gabapentin 100 mg capsule  Commonly known as: Neurontin   200 mg   Take 2 capsules (200 mg total) by mouth at bedtime.     magnesium oxide 400 mg tablet   400 mg   Take 400 mg by mouth daily Hold for loose stools..     polyethylene glycol powder packet  Commonly known as: Glycolax   17 g   Take 1 packet (17 g total) by mouth daily.     rosuvastatin 20 mg tablet  Commonly known as: Crestor   20 mg   Take 20 mg by mouth at bedtime .     sodium bicarbonate 650 mg tablet   650 mg   Take 1 tablet (650 mg total) by mouth two (2) times daily.     * SYNTHROID 50 mcg tablet  Generic drug: levothyroxine   50 mcg   Take 50 mcg by mouth every other day Pt alternates with 75mg .     * SYNTHROID 75 mcg tablet  Generic drug: levothyroxine   75 mcg   Take 75 mcg by mouth every other day Pt alternates with 50mg .           * This list has 2 medication(s) that are the same as other medications prescribed for you. Read the directions carefully, and ask your doctor or other care provider to review them with you.                STOP taking these medications        STOP taking these medications   bisacodyl 5 mg EC tablet  Commonly known as: Dulcolax      cloNIDine 0.1 mg tablet  Commonly known as: Catapres      hydrALAZINE 10 mg tablet      pantoprazole 40 mg DR tablet  Commonly known as: Protonix                Prescriptions        These medications were sent to [facility] #[id number] - [place], [place] - [address]  [address] [place] [address]      Phone: [phone]   aspirin 81 mg chewable tablet       These medications were sent to [facility] ([place]) [phone])  [address], [place] [address]      Hours: Mon-Fri 8AM-6PM, Saturdays & Holidays 8AM-5PM (Closed 1PM-2PM for lunch); Closed Sundays Phone: [phone]   allopurinol 100 mg tablet  diclofenac Sodium 1% gel  dilTIAZem 120 mg 24 hr capsule  epoetin alfa-epbx 10,000 units/mL injection  gabapentin 100 mg capsule  guaiFENesin 100 mg/5 mL syrup  hydroxyurea 500 mg capsule  hydroxyurea 500 mg capsule  loratadine 10 mg tablet  losartan 50 mg tablet           Reconciliation  I have reviewed and reconciled the discharge medication orders with both inpatient orders and PTA medication list. The following discrepancies were found during review:  Duplicate epoetin orders were placed. Patient should be receiving 7300 units SC three times weekly. Epoetin 20,000 units sc monthly order was discontinued.  Magnesium oxide was omitted from orders, will be continued outpatient since patient has been receiving  daily here.  MD will have patient use lidocaine patches PRN as over the counter medication.  Hydralazine PRN order confirmed will not be continued on discharge.    No issues requiring follow up at this time.    All discrepancies were discussed with the provider, Dr. Wendie Chess, with any changes indicated in above discharge medication list.    Jetta Lout, Pharm.D.   Clinical Pharmacist  [id number]  05/18/2019  10:15 AM

## 2019-05-18 NOTE — Nursing Note
1930 Pt received sitting on the chair. Pt a/ox4 verbalizes no needs at this time. IVF running as ordered. Chair alarm on. Instructed to call for assistance. Pt verbalizes understanding.     2000 Evening meds administered as ordered. Pt assisted to bathroom and back to bed. Pt had soft BM. Denies any pain or discomfort at this time. Bed alarm on, call light within reach. POC reviewed with pt.

## 2019-05-18 NOTE — Progress Notes
DAILY GERIATRIC PROGRESS NOTE    DATE OF SERVICE: 05/18/2019  HOSPITAL DAY: 8  CHIEF COMPLAINT: No chief complaint on file.    ID: [id number] with history of CKD 4, HTN, and CMML presenting with lightheadedness and SOB, found to have hyperleukocytosis (WBC >100k), for which she is admitted.    INTERVAL EVENTS:  - NAOE   - WBC continues to decrease (150k --> 15k)  - mIVF rate increased to 100cc/hr given increased uric acid (now downtrending)  - diltiazem well tolerated; BP improved    SUBJECTIVE:  - Still having cough, which apparently has been chronic for many months. No post nasal drip. Eyes feel itchy ,but otherwise no stuffy nose. No heart burn type symptoms. No fevers, chills. Cough is worse at night and guafenisin does not always seem to help.   - Otherwise patient is feeling well. She denies any fevers, chills, n/v, chest pain, shortness of breath, abdominal pain.    MEDICATIONS:  Scheduled:  acetaminophen, 650 mg, Oral, Q6H  allopurinol, 50 mg, Oral, Daily  aspirin, 81 mg, Oral, Daily  dilTIAZem, 120 mg, Oral, Daily  epoetin alfa-epbx, 150 Units/kg, Subcutaneous, Once per day on Tue Thu Sat  ferrous sulfate, 325 mg, Oral, Every Other Day  gabapentin, 100 mg, Oral, QHS  heparin, 5,000 Units, Subcutaneous, Q12H  hydroxyurea, 1,000 mg, Oral, Daily  hydroxyurea, 500 mg, Oral, QPM  levothyroxine, 50 mcg, Oral, Every Other Day  levothyroxine, 75 mcg, Oral, Every Other Day  lidocaine, 1 patch, Transdermal, Q24H  loratadine, 5 mg, Oral, Daily  losartan, 50 mg, Oral, Daily  magnesium oxide, 400 mg, Oral, Daily  pantoprazole, 40 mg, Oral, Daily  rosuvastatin, 20 mg, Oral, QHS  PRN:  analgesic balm, bisacodyl, diclofenac Sodium, guaiFENesin, hydrALAZINE, lidocaine PF, magnesium hydroxide, melatonin, senna  Infusions:  ? lactated ringers 100 mL/hr (05/18/19 0622)       PHYSICAL EXAM:  Temp:  [36.1 ?C (96.9 ?F)-36.6 ?C (97.8 ?F)] 36.2 ?C (97.2 ?F)  Heart Rate:  [78-109] 94  Resp:  [16-19] 16  BP: (124-176)/(71-82) 154/82  NBP Mean:  [89-111] 106  SpO2:  [98 %-100 %] 98 %  I/O: I/O last 2 completed shifts:  In: 2271.7 [P.O.:360; I.V.:1811.7; Other:100]  Out: 2250 [Urine:2250]   +600cc    General:   Well-appearing in NAD   Eyes:   PERRL, EOM grossly intact, sclerae and conjunctivae anicteric and non-injected bilaterally with no pallor or exudates.   Ears:   Hearing grossly intact   Nose/Mouth:  MMM without exudates, ulcers, or petechiae.   Neck:  Normal range of motion. No JVD   Lymph:   No anterior/posterior cervical or submandibular LAD.   Lungs:   CTAB, no wheeze, rhonchi, or crackles   Chest/Heart:   RRR, normal S1 S2, crescendo-decrescendo 2/6 systolic murmur heard best at 2nd R intercostal space, otherwise no MGR.    Abdomen:   Non-distended. No tenderness or masses on palpation, no rebound or guarding. +Splenomegaly   GU:   Not indicated.   Skin/integument:  No rashes, lesions, or breakdown. Fingernails with normal contour, color, and lunulae.   Extremities:   No cyanosis, clubbing, or edema. . Preserved ROM of L shoulder.     Neuro:   Alert and interactive, cranial nerves II-XII grossly intact,  MS 5/5 in all extremities. Walks smoothly with FWW       CAM DELIRIUM TEST:  Feature One:   A. Acute Change in mental status from baseline? []  yes [x]  no  B. Is there fluctuation in the mental status? []  yes [x]  no    Feature Two:  A. Does that patient have difficulty focusing attention?  []  yes [x]  no   (days of week, serial 7s)    Feature Three:  A.Is there evidence of disorganized thinking?  []  yes [x]  no    Feature Four:  A. Does the patient have an altered level of consciousness (ie: vigilant, lethargic, stuporous, comatous?  []  yes [x]  no    The diagnosis of delirium by CAM requires the presence of features 1 and 2 and either 3 or 4      DATA:  I have reviewed the following information from the last 24 hours: allied health and treating physician notes, imaging, labs and microbiology data and cardiac studies and telemetry data:    Lab Results   Component Value Date    WBC 15.64 (H) 05/18/2019    HGB 7.3 (L) 05/18/2019    HCT 23.2 (L) 05/18/2019    PLT 105 (L) 05/18/2019     Lab Results   Component Value Date    NA 138 05/18/2019    K 4.3 05/18/2019    CL 106 05/18/2019    CO2 21 05/18/2019    BUN 45 (H) 05/18/2019    CREAT 1.44 (H) 05/18/2019    GLUCOSE 86 05/18/2019    CALCIUM 8.9 05/18/2019    MG 1.3 (L) 05/11/2019    PHOS 5.5 (H) 05/18/2019     Lab Results   Component Value Date    APTT 63.6 (H) 05/18/2019    PT 15.4 (H) 05/18/2019    INR 1.3 05/18/2019     Lab Results   Component Value Date    ALT 16 05/14/2019    AST 37 05/14/2019    BILITOT 0.4 05/14/2019    ALKPHOS 202 (H) 05/14/2019    ALBUMIN 4.0 05/14/2019     Lab Results   Component Value Date    TSH 5.5 (H) 05/10/2019     Lab Results   Component Value Date    CHOL 77 (L) 01/18/2019    CHOLHDL 19 (L) 01/18/2019    TRIGLY 165 (H) 01/18/2019       MICRO:  Covid-19 PCR negative, 05/10/19    IMAGING:     TTE, 05/13/19:    CONCLUSIONS   1. Technically difficult study.   2. Normal left ventricular size.   3. Mild concentric left ventricular hypertrophy.   4. Left ventricular ejection fraction is approximately 60 to 65%.   5. Abnormal LV diastolic function (Grade I).   6. Moderate aortic valve stenosis with an aortic valve area of 1.15 cm? (index 0.67 cm?/m?).   7. Mild mitral valve regurgitation.   8. The peak TR velocity is not well defined, and therefore, pulmonary artery systolic pressure cannot be calculated. Based on the acceleration time in the RV outflow tract, the PA pressure is likely to be elevated.   9. There are no prior studies on this patient for comparison purposes.    L Shoulder x-ray, 05/13/19:  IMPRESSION:  1. No bone lesions.  2. Mild glenohumeral osteoarthritis. Anterior acromial spur, mild acromiohumeral narrowing and spur formation on the greater tuberosity suggest chronic rotator cuff disease.    Renal ultrasound, 05/11/19:  IMPRESSION:  ?  Mild right hydronephrosis, unchanged since prior.  Left pelviectasis.    Abdominal ultrasound, 05/11/19:  IMPRESSION:  ?  1. Marked splenomegaly.  Mild hepatomegaly.  ?  2. Borderline right hydronephrosis.  CXR, 05/10/19:  IMPRESSION:  ?  Stable cardiomediastinal silhouette with mildly tortuous, calcified thoracic aorta. Persistent mild cardiomegaly.  Normal lung volumes. Increased density of the bilateral lower lung zones is accentuated due to overlying breast implants.  There are bilateral lower lobe patchy consolidations, most suggestive of pulmonary edema versus pneumonia.   No right pleural effusion. Trace left pleural effusion. No pneumothorax. Elevation of the left hemidiaphragm  No acute osseous abnormalities.  ?  Pertinent pathology:  [place] Name: Lindsay Brennan, Lindsay Brennan Accession#: [id number] Med. Rec. # [id number] Taken: 10/30/2016 Encounter #: [id number] Received: 10/30/2016 Gender: F Reported: 11/04/2016 16:53 DOB: 12-21-1934 (Age: 32)  Submitting Physician: Hubert Azure D M.D.  Additional Physician(s):  [facility]  Clinical Information:  85 year old with remota history of mild pancytopenia in 2008. There ara no recent history or CBC studies.  Specimen Received:  A: Bone marrow,aspirate for clot section, pic,cyto sent to iu B: Bone marrow, biopsy (decalelfied),Ipic  ?  Final  Pathologic Diagnosis:  id B. Bone marrow, bio and aspirate for clot section, Jeft posterioriliac crest: Myeloid neoplasm. See note.  As the senior physician, ~ attest that I: (i) examined the relevant preparation(s) for the specimen(s); and (ii) rendered or confirmed the diagnosis(es).  ?Electronically Signed ngz1/10/30/2016 Jobie Quaker, M.D. (Pathologist)  Deanne Coffer, M.D. (Resident) Signing Location: [facility], [address], [address], [place], [place] [address]  Note:  The majordifferential diagnosis includes chronic myelomonocytic leukemia. Clinical correlation and correlation with cytogenetics/FISH study including BCR-ABL7 fusion study is recommended for further classification.  Dr. 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  ?  Cellularity: Markedly hypercellular (> 90%) Myslopoiesis: Markedly increased with full maturation Erythropoiesis: Decreased Megakaryopoiesis: Markedly increased with frequent hypolobated/monolobated megakaryocytes including small hyperchromatic forms Additional studles: Reticulin stain shows mild increasein reticulin fibrosis (1+) with appropriate control,  Theimmunohistechemical stains are performedin this tase and same of the antigens may also ba evaluated by flow cytometry, Concument immunchistechemieal stains on tissue sections are Indicated in order to correlate immunophenotype with cell morphology, evaluate spatial dletribution/architectural features and/or for a quantitation of disease involvement  Bone marrow,aspirate smear:  Sample quality: Adequate  ME ratio: Markedly increased, ~ 8:1 Blasts: Not increased (4%) ' Myelopoiesis: Markedly increased with occasional hypolobated neutrophils Erythropoiesis: Decreased with megaloblastoid changes Megakaryopoiesis: Markedly increased with frequent hypolobated/monolobated megakaryocytes Comments: No clusters of blasts, no Auer rods. Additional studies: Iron stain performed at the [facility] showsincreased storage iron with no ring sideroblasts. Control stains appropriately,  Bone marrow differential:  Percent  Blasts 2  Promyeltocytes 7  Myelocytes 23  Metamyelocytes 6  Bands 16  Polys 30  Lymphocytes 3  Monocytes 2  Eosinophils 4  Basophils 1  Nucleated RBCs 10  Peripheral blood, smear:  CBC: WBC 46 k/cumm, hemoglobIn 10,7 g/dL, MCV 81 fL, ROW 66.4%, platelet count 316 k/cumm Differential: Neutrophils 57%, lymphocytes 5%, monocytes 21%, myelocytas 3%, Metamyelocytes 8%, band 6%  RBCs: Normocytic anaemia with anlsopgikilocytosis. WEBCs: Leucocytosis, granulocytosis with left-shift, monocytosis. Platelets: Normal platelet caunt with occasional giantplatelets  ?  Results-Comments  CD34 and p53 stains show rare CD34+ blasts (<5%) and exceedingly rare p53+ cells. Controls stain appropriately.  ?  INTERERETATION: (600 cell diff)  ?  Bone Marrow Aspirate specimen is adequate and is markedly hypercellular. Spicules are present.  Megakaryocytes Are increaged in numberwith occasional small mono/hypolobated forms and rare small separated lobe forms identified. My elopoiesis Is abnormal, increased, and proceeds to completion. Blasts and promonocytes  comprise 6% of total nucleated cells. 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 The M/E ratio is 8.871.  Erythroid series Shows occasional megaloblastoid changes.  Lymphoid Lymphocytes comprise 2% of total nucleated cells,  Comments: Correlate with bone marrow biopsy and cytogenetic studies,  Tron Stain Results: +2/6 Iron in reticulum, no ringed formsidentified.  Control: positive.  IMPRESSION: DATESIGNED OUT: 11/01/2016 TIME: 10:10 CMML-1.  COMMENT: The marrow biopsy was not available at the time of finalizing the aspirate report. In the event of a marrow biopsy report with discrepant results an addendum will be provided by Dr [facility].  ?  My blast count was 8% ( 300 cell differential) with 15% monocytes; trilineage dysplasia; increased numbers of megakaryocytes and 90-100% cellular particles.  Becauseofthe high WBC I did my counting very close to the particles to void any dilution effect from the peripheral blood.  1% blasts in PBS and 19% monocytes.  JMB    ASSESSMENT/PLAN:   Sharlisa Hollifield is a 85 y.o. 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  ?  #Symptomatic Hyperleukocytosis   #History of chronic myelomonocytic leukemia type-1  Patient's lightheadedness and SOB may be secondary to WBC >100k, though other than elevated Cr no e/o end-organ damage. Blasts <5% (4% on admission) making blast crisis less likely, though patient declining bone marrow bx to determine extent of blast cells in bone marrow. Patient has not received treatment previously, and notably CMML is not driven by BCR-ABL mutation. Evaluated by hematology/oncology who recommend hydroxyurea (dose-reduced for eGFR <60); if no improvement in WBC or worsening in symptoms will consider leukapheresis. Toxicities of hypo-methylating agents (treatment of choice in MDS/CMML) likely outweigh benefits in this 85yo patient, and regardless patient hopes to return to [place] for consideration of more aggressive treatment once she is stabilized. 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 Of note, no e/o of infection to explain marked increase in WBC from prior baseline (previously 45k in October 2020), though will have low threshold to start antibiotics, noting PCN allergy (hives). Elevated procalcitonin (1.12) difficult to interpret in setting of high circulating cytokines from heme malignancy. Leukocytosis continues to improve. Will continue to monitor TLS labs, especially uric acid prior to discharge.   []  heme/onc consulted, appreciate recs:  - hydroxyurea started 4/6 --> increased to 1g BID on 4/9 given persistently elevated WBC count (>100k) and improving GFR --> decrease to 1g qAM, 500mg  qPM (4/12-) given improvement in WBC count but also with anemia and thrombocytopenia  -Trend TLS labs (uric acid, phos, K, iCa) q12h  -Trend DIC labs (INR, PTT, fibrinogen, D-dimer, platelets) q12h  --> maintain platelets >30lk  --> maintain fibrinogen >100  - continue mIVF 100cc/hr   --> IV Lasix 20mg  prn if worsening oxygenation or high positive fluid balance on strict I/Os  - continue allopurinol 50mg  qday (this is max dose per pharmacy given AKI and age, but to follow up with pharmacy again tomorrow with repeat kidney function test)  -Follow-up hematology malignancy panel  -Follow-up peripheral blood flow cytometry  []  f/u with Dr. Lennon Alstrom with CBC beforehand (prior to transitioning back to care under Dr. Silvio Clayman in [place]).    #  Acute hypoxic respiratory failure, resolved:  #HFpEF, new diagnosis:  BNP elevated on admission (532) with TTE on 05/13/19 showing grade I diastolic dysfunction. Hypoxia on presentation likely 2/2 volume overload given improvement with IV Lasix.   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 Suspect hypervolemia due to renal insufficiency. BNP mildly elevated. No acid/base disorder noted on 4/5 VBG. UA without significant pyuria. L pelviectasis on renal ultrasound but per renal this is likely sequelae of prior nephrolithiasis. UMA 377mg /g.  []  renal consulted, appreciate recs:  - lisinopril restarted 4/8 given Cr stable and BP persistently elevated, for long-term benefit in CKD --> change to losartan as below  - trend Cr daily  - allopurinol 50mg  qday   - as per heme/onc recs, will discuss need for phosphate binder with nephrology    #Lightheadness, POA, improving:  #Moderate Aortic Stenosis, new diagnosis:  Symptoms likely due to hyperluekocytosis, though query component of medication adverse effect (clonidine and diltiazem) as well as deconditioning. TTE on 05/13/19 showing moderate AS, though unlikely to be cause of symptoms.  []  decrease home diltiazem as below and discontinue clonidine 0.1mg  TID  []  fall precautions --> junior walker ordered per PT recs  []  PT/OT following  []  repeat TTE April 2022 to assess for progression of AS  ?  #Elevated INR, POA, improving:  INR 1.9 on admission. This raises c/f synthetic liver dysfunction due to leukostasis or DIC, which can occur in up to 40% of patients with hyperleukocytosis. Per heme/onc, high INR likely due to factor X deficiency from factor X binding to atypical monocytes. Reassuringly, other DIC labs WNL (other than D-dimer), and other liver labs WNL (including albumin at 3.5). Consider nutritional deficiency given recent weigh loss, or consumptive coagulopathy given splenomegaly. INR now improving.  []  CTM, will consider vit K administration if increasing INR, after discussion with heme/onc  ?  #Anemia, POA, worsening: Hgb 8.9 on admission --> 7.7 on 4/10. Chronic anemia likely 2/2 renal disease and CMML. Iron studies not c/w iron deficiency anemia (ferritin 640, 28% transferrin saturation). Will give epo given Hb <8.0 (noting worsening anemia as possible adverse effect of hydroxyurea).  - trend CBC daily  - continue epo (retacrit) 150u/kg T/Th/Sa (renal dosing)  - continue home iron supplementation (given CKD and on epo)  ?  #Cough, chronic  May be secondary to lisinopril given onset concomitant with initiation of that medication. However, patient today notes several months of chronic cough of unclear etiology. Consider allergy related.   - further outpatient workup  - guaifenesin prn  []  start loratadine given c/f allergic component    #Hypertension:   Initially held home diltiazem 180mg  and clonidine 0.1mg  TID given possibly contributing to lightheadedness and dizziness. Blood pressure has improved with below regimen and she reports no further episodes of dizziness.   - continue losartan 50mg  po qday lisinopril 20mg  to losartan 50mg  given new cough  - continue diltiazem 120mg  qday  at lower dose (home dose is 180mg )   - hydral 25mg  PO q6h prn for SBP >180 and/or DBP >110    #L shoulder pain, chronic but worsening:  X-ray suggestive of chronic rotator cuff disease, however exam also with TTP along L trapezius suggestive of myofascial pain syndrome.   []  [place] consulted, now s/p trigger point injection on 4/8 with improvement in symptoms  -  diclofenac 1% gel prn  - lidocaine patch prn  - acetaminophen RTC    Chronic/Stable Medical Problems  #Subclinical hypothyrodism: TSH 5.2 on admission  -Continue levothyroxine , on alternating days   ?  #HLD:   -Continue rosuvastatin?20mg   ?  #Peripheral neuropathy, POA:  -Continue gabapentin but dose reduce to 100mg  at bedtime (from 200mg ) given worsening kidney function and dizziness  ?  Inpatient Checklist  Diet: renal (low K, low phos)  GI ppx: home PPI  DVT ppx: heparin subq  Lines: pIV  Tubes/Drains: none  ?  CODE STATUS: Full Code  Primary Emergency Contact: Margot Ables [phone])  ?  Disposition:  Home with home health   ?  Ambulatory Status & Fall Risk  [] ? non-ambulatory  [] ? walks with cane and/or walker  [x] ? walks independently  [] ? walks independently but may benefit from an assistive device evaluation with physical therapy  [] ? needs supervision with walking secondary to poor safety awareness  ?  Discharge Considerations:  [x] ? recommend PT evaluation to see if appropriate for skilled nursing facility   [x] ? recommend increase level of care/supervision then patient currently has at home  [] ? recommend 24 hour supervision  [] ? recommend home health services  [] ? after stabilization patient may return to prior living situation as it is adequate to meet his/her needs  [] ? recommend new assistive device for ambulation, safety, and fall risk  [x] ? will re-evaluate later in the hospital course once further stabilized    This patient has an advanced directive:  Yes []   No [x]  Unknown []     This patient's designated decision maker if or when patient lacks capacity is: Daughter Kim  Code Status (designated by patient): Full Code    Discussed with attending, Dr. Constance Haw, MD/MBA  Internal Medicine, PGY-3 [phone]     I have interviewed and examined the patient with Dr. Wendie Chess on the date of service. All labs, studies, and medications were independently reviewed by me. I have reviewed the note above and agree with the history, physical, assessment and plan which we formulated together. Attending additions, if made, are shown in italics within the body of the note. The patient and family were informed of the above plan, demonstrated understanding and was agreeable to the plan as written above.       Alba Cory, MD, PhD, 05/19/2019

## 2019-05-18 NOTE — Telephone Encounter
Update:    Thank you Dr. Master for accommodating patient     Patient Lindsay Brennan [id number] has been rescheduled for    Pcp Dr. Tawanna Sat on 05/24/2019 at 11:00   [place]  [address] , [place]  [phone]    Hem Dr. Loreli Slot on 05/28/2019 at 8:30  [place], [place]  [phone]    Called patients daughter Lindsay Brennan at [phone]. Confirmed appointments. Mailed letters.

## 2019-05-18 NOTE — Other
Patients Clinical Goal:   Clinical Goal(s) for the Shift: vss, comfort, pain control  Identify possible barriers to advancing the care plan: None  Stability of the patient: Moderately Stable - low risk of patient condition declining or worsening   End of Shift Summary: Awake, alert and oriented x4. Denies pain. IV On LR at 100cc/hr. SPO2 on RA is  98%. Up to chair and ambulated down the hallway x3 tdoday. Left shoulder pain controlled by Tylenol. PM labs drawn. Safety precautions maintained. BP 168/78  ~ Pulse (!) 109  ~ Temp 36.3 C (97.4 F) (Oral)  ~ Resp 18  ~ Ht 1.549 m (5' 1'')  ~ Wt 47.4 kg (104 lb 8 oz)  ~ SpO2 100%  ~ BMI 19.75 kg/m

## 2019-05-19 ENCOUNTER — Telehealth: Payer: MEDICARE

## 2019-05-19 NOTE — Telephone Encounter
Call Back Request    MD:  Master     Reason for call back:  Pam from [facility] calling to advise pt was admitted to home health today. Per Pam, they will be doing skilled nursing, PT, and OT. Pam stated they will be doing blood work for pt tomorrow and she would like to find out the frequency of labs going forward. Pam also would like to clarify if retacrit injection should be given on 04/16(Fri) as she was informed by caregiver that it should be given on Tues, Thurs, & Saturday. Please advise.    Cbn: [phone]    Thank You    Any Symptoms:  []  Yes  [x]  No      ? If yes, what symptoms are you experiencing:    o Duration of symptoms (how long):    o Have you taken medication for symptoms (OTC or Rx):      Patient or caller has been notified of the 24-48 hour turnaround time.

## 2019-05-19 NOTE — Progress Notes
BRIEF OT DISCHARGE SUMMARY: Patient seen for OT initial evaluation only, and discharged prior to further therapy. Goals not met and discharge from OT due to above. Please see prior documentation in Notes for details and d/c recommendations.

## 2019-05-19 NOTE — Telephone Encounter
Dr. Master    Please advise.      Thank you,    [facility]

## 2019-05-20 ENCOUNTER — Telehealth: Payer: PRIVATE HEALTH INSURANCE

## 2019-05-20 ENCOUNTER — Ambulatory Visit: Payer: PRIVATE HEALTH INSURANCE

## 2019-05-20 ENCOUNTER — Telehealth: Payer: MEDICARE

## 2019-05-20 DIAGNOSIS — Z0000 Encounter for general adult medical examination without abnormal findings: Secondary | ICD-10-CM

## 2019-05-20 NOTE — Telephone Encounter
I do not know yet, it will depend on her results.

## 2019-05-20 NOTE — Telephone Encounter
Thank you Dr Master. I relayed this to [facility] from [place] and she also wants to ask you, how long will the labs and injections last for? And If you can place an order for tub care and bedside commode?      Thanks,    [facility]

## 2019-05-20 NOTE — Telephone Encounter
Labs t/th/sat on same day as retacrit injection.    Thanks

## 2019-05-21 ENCOUNTER — Ambulatory Visit: Payer: MEDICARE

## 2019-05-21 ENCOUNTER — Inpatient Hospital Stay
Admit: 2019-05-21 | Discharge: 2019-05-21 | Disposition: A | Discharge status: Home / Self Care | Payer: PRIVATE HEALTH INSURANCE | Source: Home / Self Care

## 2019-05-21 ENCOUNTER — Telehealth: Payer: MEDICARE

## 2019-05-21 DIAGNOSIS — R531 Weakness: Secondary | ICD-10-CM

## 2019-05-21 DIAGNOSIS — C9310 Chronic myelomonocytic leukemia not having achieved remission: Secondary | ICD-10-CM

## 2019-05-21 DIAGNOSIS — D649 Anemia, unspecified: Secondary | ICD-10-CM

## 2019-05-21 LAB — Prothrombin Time Panel: PROTHROMBIN TIME: 16 s — ABNORMAL HIGH (ref 11.5–14.4)

## 2019-05-21 LAB — APTT: APTT: 41.1 s — ABNORMAL HIGH (ref 24.4–36.2)

## 2019-05-21 LAB — B-Type Natriuretic Peptide: BNP: 91 pg/mL (ref <100)

## 2019-05-21 LAB — Basic Metabolic Panel
GFR ESTIMATE FOR AFRICAN AMERICAN: 30 mL/min/{1.73_m2} (ref 65–99)
GFR ESTIMATE FOR NON-AFRICAN AMERICAN: 26 mL/min/{1.73_m2} (ref 7–22)

## 2019-05-21 LAB — CBC
HEMATOCRIT: 22.9 — ABNORMAL LOW (ref 34.9–45.2)
MCH CONCENTRATION: 29.7 g/dL — ABNORMAL LOW (ref 31.5–35.5)

## 2019-05-21 LAB — Differential Automated: NEUTROPHIL PERCENT, AUTO: 70.7 (ref 0.00–0.04)

## 2019-05-21 LAB — CK, Total: CREATINE PHOSPHOKINASE, TOTAL: 15 U/L — ABNORMAL LOW (ref 38–282)

## 2019-05-21 LAB — Troponin I: TROPONIN INTERPRETATION: NEGATIVE ng/mL (ref <0.1)

## 2019-05-21 MED ADMIN — DILTIAZEM HCL ER COATED BEADS 120 MG PO CP24: 120 mg | ORAL | @ 14:00:00 | Stop: 2019-05-21 | NDC 60687019501

## 2019-05-21 MED ADMIN — LOSARTAN POTASSIUM 50 MG PO TABS: 50 mg | ORAL | @ 14:00:00 | Stop: 2019-05-21 | NDC 68084034701

## 2019-05-21 NOTE — ED Provider Notes
[facility]  Emergency Department Service Report    Lindsay Brennan   Emergency Provider Note:  05/20/2019 85 y.o. female presents with Abnormal Lab (sent by pcp for low platelet and hemoglobin 6.8 for blood transfusion +generalized weakness)    Triage   Arrived on 05/20/2019 at 9:50 PM  Arrived by Walk-in [14]    History   Chief Complaint:   Abnormal Lab (sent by pcp for low platelet and hemoglobin 6.8 for blood transfusion +generalized weakness)    History of Present Illness:   Lindsay Brennan is a 85 y.o. female with a hx of CML, HTN, CKD, HLD, anemia who presents to the ED for evaluation of generalized weakness that began yesterday at home. Sx are constant, worsening, no palliating factors. Pt was sent in by oncologist Dr. Lennon Alstrom for 2 units RBCs.   Also had 2 episodes of nonbloody diarrhea earlier today.   Denies fever, cough, chest pain, or SOB. Admitted April 5-13. Not currently on chemo, but does take hydroxyurea (did not take today).             Past Medical History:  Past Medical History:   Diagnosis Date   ? CKD (chronic kidney disease)    ? GERD (gastroesophageal reflux disease)    ? History of CVA (cerebrovascular accident) 2011   ? 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 Hyperlipidemia    ? Hypertension    ? Hypothyroidism    ? Leukemia (HCC/RAF)    ? Myelodysplasia (myelodysplastic syndrome) (HCC/RAF)         Past Surgical History:  Past Surgical History:   Procedure Laterality Date   ? CESAREAN SECTION     ? MASTECTOMY Bilateral    ? TOTAL ABDOMINAL HYSTERECTOMY         Medications:   Previous Medications    ALLOPURINOL 100 MG TABLET    Take 1/2 tablets (50 mg total) by mouth daily.    ASPIRIN 81 MG CHEWABLE TABLET    Chew 1 tablet (81 mg total) by mouth daily.    DICLOFENAC SODIUM 1% GEL    Apply 2 g topically every six (6) hours as needed (for L shoulder pain).    DILTIAZEM (CARDIZEM CD) 120 MG 24 HR CAPSULE    Take 1 capsule (120 mg total) by mouth daily.    EPOETIN ALFA-EPBX (RETACRIT) 10,000 UNITS/ML INJECTION    Inject 0.73 mLs (7,300 Units total) under the skin three (3) times a week.    ESOMEPRAZOLE 40 MG CAPSULE    Take 40 mg by mouth daily .    FERRIC CITRATE 1 GM 210 MG(FE) TABS    Take 1 tablet by mouth three (3) times daily with meals.    GABAPENTIN 100 MG CAPSULE    Take 2 capsules (200 mg total) by mouth at bedtime.    GUAIFENESIN 100 MG/5 ML SYRUP    Take 5 mLs (100 mg total) by mouth every six (6) hours as needed.    HYDROXYUREA 500 MG CAPSULE    Take 2 capsules (1,000 mg total) by mouth every morning and take 1 capsule (500 mg total) by mouth every evening.    HYDROXYUREA 500 MG CAPSULE    Take 1 capsule (500 mg total) by mouth every evening.    LORATADINE 10 MG TABLET    Take ONE-HALF tablets (5 mg total) by mouth daily.    LOSARTAN 50  MG TABLET    Take 1 tablet (50 mg total) by mouth daily.    MAGNESIUM OXIDE 400 MG TABLET    Take 400 mg by mouth daily Hold for loose stools.Marland Kitchen    POLYETHYLENE GLYCOL POWDER PACKET    Take 1 packet (17 g total) by mouth daily.    ROSUVASTATIN 20 MG TABLET    Take 20 mg by mouth at bedtime .    SODIUM BICARBONATE 650 MG TABLET    Take 1 tablet (650 mg total) by mouth two (2) times daily.    SYNTHROID 50 MCG TABLET    Take 50 mcg by mouth every other day Pt alternates with 75mg .    SYNTHROID 75 MCG TABLET    Take 75 mcg by mouth every other day Pt alternates with 50mg .       Allergies:   Coffee, Oxycodone-acetaminophen, Penicillins, Sulfa antibiotics, and Naproxen     Social History:   Social History     Socioeconomic History   ? Marital status: Widowed     Spouse name: Not on file   ? Number of children: Not on file   ? Years of education: Not on file   ? Highest education level: Not on file   Occupational History   ? Not on file   Social Needs   ? Financial resource strain: Not on file   ? Food insecurity     Worry: Not on file     Inability: Not on file   ? Transportation needs     Medical: Not on file     Non-medical: Not on file   Tobacco Use   ? Smoking status: Never Smoker   ? Smokeless tobacco: Never Used   Substance and Sexual Activity   ? Alcohol use: Not Currently   ? Drug use: Never   ? Sexual activity: Not on file   Lifestyle   ? Physical activity     Days per week: Not on file     Minutes per session: Not on file   ? Stress: Not on file   Relationships   ? Social Wellsite geologist on phone: Not on file     Gets together: Not on file     Attends religious service: Not on file     Active member of club or organization: Not on file     Attends meetings of clubs or organizations: Not on file     Relationship status: Not on file   Other Topics Concern   ? Need Help Feeding Yourself? Not Asked   ? Need Help Getting Dressed? Not Asked   ? Need Help Using the Telephone? Not Asked   ? Need Help Managing Money? Tree surgeon, [employer]) Not Asked   ? Need Help Shopping for Groceries? Not Asked   ? Need Help Getting Places Beyond Walking Distance? [employer], [employer]) Not Asked   ? Need Help Getting from Bed to Chair? Not Asked   ? Need Help Bathing or Showering? Not Asked   ? Need Help Taking your Medications? Not Asked   ? Need Help Doing Moderately Strenuous Housework? (ex. Laundry) Not Asked   ? Need Help Driving? Not Asked   ? Need Help Getting to the Toilet? Not Asked   ? Need Help Walking Across the Road? (Includes Ephraim Hamburger) Not Asked   ? Need Help Preparing Meals? Not Asked   ? Need Help Shopping for Personal Items? (Toiletries, Medicines) Not Asked   ?  Need Help Climbing a Flight of Stairs? Not Asked   ? Do you live with someone who assists you at home? Not Asked   ? Do you get help from family members or friends in your home? Not Asked   ? Do you employ someone to provide health related care or help you in your home? Not Asked   ? Do you provide care for a family member? Not Asked   ? Does your home have rugs in the hallway? Not Asked   ? Does your home have poor lighting? Not Asked   ? Does your home lack grab bars in the bathroom? Not Asked   ? Does your home lack handrails on the stairs? Not Asked   ? Have you noticed any hearing difficulties? Not Asked   ? Do you currently participate in any regular activity to improve or maintain your physical fitness? Not Asked   ? Do you always wear a seatbelt when you ride in a car? Not Asked   ? If you drink alcohol, do you drink more than 7 drinks per week or more than 3 drinks on any given day? Not Asked   ? Has anyone ever been concerned about your drinking? Not Asked   ? Do you exercise at least a day, 3 or more days a week? No   ? Types of Exercise? (List in Comments) No   ? Do you follow a special diet? No   ? Vegan? No   ? Vegetarian? No   ? Pescatarian? No   ? Lactose Free? No   ? Gluten Free? No   ? Omnivore? No   Social History Narrative   ? Not on file       Family History:  family history includes Colon cancer in her brother and son; Emphysema in her father; Heart attack in her brother; Lung cancer in her sister and sister; Ovarian cancer in her sister; Stroke in her mother.    Review of Systems:   General: no fever  Cardio: no chest pain.   Resp: No cough or SOB.   Neuro: + generalized weakness.  Hem: + abnormal labs (low hgb)  All systems reviewed, negative except as described above     Physical Exam   VITAL SIGNS: BP 149/59 (Patient Position: Sitting)  ~ Pulse (!) 102  ~ Temp 36.1 ?C (97 ?F) (Forehead)  ~ Resp 16  ~ Ht 1.549 m (5' 1'')  ~ Wt 47.6 kg (105 lb)  ~ SpO2 99%  ~ BMI 19.84 kg/m?   Pulse oximetry is interpreted by myself as normal   General: Moderate distress. Alert and awake.  HEENT: Normocephalic. Atraumatic. Pupils equal  Neck:  Supple Trachea is midline              Chest: Lungs clear to auscultation without crackles/ wheezes  Cardiovascular: Regular rate and rhythm without murmurs, rubs or gallops.    Abdomen: Soft, nontender, nondistended  Skin: Warm Pink and well-perfused extremities with brisk capillary refill. Extremities: No cyanosis, clubbing or edema. Normal joint range of motion.  Neurologic: Alert &  No deficits noted.      RECORDS REVIEWED:   I have reviewed patient's available medical record, nursing notes and information.    Laboratory Results   Labs:  Labs Reviewed - No data to display      Imaging Results   ECG:  ***    Radiology:  No orders to display  Administered Medications     Medication Administration from 05/20/2019 2150 to 05/20/2019 2214     None            Procedures   CRITICAL CARE NOTE: SEVERE ANEMIA REQUIRING TRANSFUSION  I spent 35 minutes in direct care of this patient excluding procedures.  Critical Care time includes review of prior records, review and interpretation of studies done today, and repeat bedside assessments of the patient's clinical status.  Time also included discussion with the patient and family as to the diagnosis, potential treatments and their risk/benefits, as well as discussion with admitting/consulting physicians in regards to this case.          ED Course and Medical Decision Making   Lindsay Brennan is a 85 y.o. female who presents with ***          The patient was evaluated in the context of a global pandemic, which necessitated consideration that the patient may be at risk for infection with COVID-19. Institutional protocols that pertain to patients at risk for COVID-19 are in a state of rapid change based on the CDC along with federal and state regulatory bodies. These policies and algorithms were followed and met standards of care during the patients care in the emergency department.     Clinical Impression   No diagnosis found.      Disposition and Follow-up   Disposition:       Prescriptions     New Prescriptions    No medications on file         Future Appointments       Provider Department Visit Type    05/24/2019 11:00 AM Sharen Counter., MD [place] [facility] FOLLOW UP    05/28/2019 8:30 AM Master, Humberto Leep., DO  Health- Hematology Oncology [place] [facility]URN    06/01/2019 10:00 AM Goshtaseb, Lucrezia Europe, MD [facility], [facility] [address] PROVIDER TRANSFER        The documentation on this chart was performed by Cleophas Dunker Hortencia Martire, scribed for Sophronia Simas., MD    05/20/2019 10:15 PM     All scribe entries and documentation made by the scribe were entered at my direction.  I have reviewed this medical record and agree to the accuracy and completeness of the content entered by the scribe.  The documentation recorded by the scribe accurately reflects the service I personally performed and the decisions made by me.

## 2019-05-21 NOTE — ED Notes
Pt given warm blankets and status update on plan of care. Pt's daughter at bedside. NAD noted. Call bell within reach; safety measures in place.

## 2019-05-21 NOTE — ED Notes
Pt c/o generalized weakness, dizziness, and fatigue since yesterday. Pt had labwork done today; pt told to come to ER for blood transfusion. Pt had low platelet and hgb.

## 2019-05-21 NOTE — ED Notes
X-ray at bedside

## 2019-05-21 NOTE — ED Notes
Report given to Brenda, RN

## 2019-05-21 NOTE — ED Notes
Report to Lilit RN.

## 2019-05-21 NOTE — Telephone Encounter
DME orders req.

## 2019-05-21 NOTE — ED Notes
Patient completed first unit of blood without incident. Awaiting next unit. Patient was lying comfortably on R side, assisted patient to back lying position for repeat VS. All needs met at this time

## 2019-05-21 NOTE — Telephone Encounter
Princess and/or [place],    Can you help with this please?  I did not discharge the patient so not sure why this is coming to me  Please assist    Sincerely,  Wai Litt L. Darla Lesches, [place] [facility]  [phone]

## 2019-05-21 NOTE — Addendum Note
Addended by: Greggory Keen on: 05/21/2019 03:52 PM     Modules accepted: Orders

## 2019-05-21 NOTE — ED Notes
Daughter at the bedside; patient given crackers and water.

## 2019-05-21 NOTE — ED Notes
COLLECTIVE?NOTIFICATION?05/20/2019 21:50?Aviella, Disbrow Tracie?MRN: [id number]    [facility]'s patient encounter information:   UVO:[id number]  Account [id number]  Billing Account [id number]      Criteria Met      2 Visits in 30 Days    Security and Safety  No recent Security Events currently on file    ED Care Guidelines  There are currently no ED Care Guidelines for this patient. Please check your facility's medical records system.            E.D. Visit Count (12 mo.)  Facility Visits   [facility] 1   [place] 1   Total 2   Note: Visits indicate total known visits.     Recent Emergency Department Visit Summary  Date Facility [facility] Type Diagnoses or Chief Complaint   May 20, 2019 [facility] [place] [facility]     May 09, 2019 [facility] [place] [facility]      1. Dizziness and giddiness      2. Dizziness      2. Acute respiratory failure with hypoxia      3. Chronic kidney disease, unspecified      4. Anemia in chronic kidney disease      5. Personal history of transient ischemic attack (TIA), and cerebral infarction without residual deficits      6. Personal history of in-situ neoplasm of breast      7. Myelodysplastic syndrome, unspecified      8. Chronic myelomonocytic leukemia not having achieved remission          Recent Inpatient Visit Summary  Date Facility [facility] Type Diagnoses or Chief Complaint   May 13, 2019 [facility] [place] [facility]      1. Dizziness and giddiness      1. Essential (primary) hypertension      3. Cardiac murmur, unspecified      4. Acute respiratory failure with hypoxia      5. Chronic kidney disease, stage 4 (severe)      6. Elevated white blood cell count, unspecified      6. Acute kidney failure, unspecified      8. Chronic myelomonocytic leukemia, in relapse      9. 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   Any care recommendations and other clinical information are provided as guidelines or for historical purposes only, and providers should exercise their own clinical judgment when providing care.    2.   You may only use this information for purposes of treatment, payment or health care operations activities, and subject to the limitations of applicable Collective Policies.    3.   You should consult directly with the organization that provided a care guideline or other clinical history with any questions about additional information or accuracy or completeness of information provided.    ? 2021 [place], [employer]. - [email or web address]

## 2019-05-21 NOTE — ED Notes
Report received from Brenda, RN for lunch relief.

## 2019-05-21 NOTE — Telephone Encounter
Received page from lab for critical hgb 6.8.   Called patient's daughter and recommended she go to [facility] ED for transfusion - 2 units pRBCS. Instructed to hold hydrea.   [facility] ED contacted to provide notification.

## 2019-05-21 NOTE — Telephone Encounter
Orders Request    What is being requested? (Tests, Labs, Imaging, etc.): Shower Chair, Raised Toilet Seat and Bedside Commote      Reason for the request: [place]     Where does the patient want to be seen?       If outside [facility], what is the fax number to the facility?      Has the patient seen their doctor for this matter? Yes     Last office visit:     Patient was offered an appointment but declined.    Patient has been notified of the 24-48 hour turnaround time.

## 2019-05-24 ENCOUNTER — Ambulatory Visit: Payer: PRIVATE HEALTH INSURANCE

## 2019-05-24 ENCOUNTER — Ambulatory Visit: Payer: MEDICARE

## 2019-05-24 ENCOUNTER — Telehealth: Payer: PRIVATE HEALTH INSURANCE

## 2019-05-24 DIAGNOSIS — E039 Hypothyroidism, unspecified: Secondary | ICD-10-CM

## 2019-05-24 DIAGNOSIS — Z09 Encounter for follow-up examination after completed treatment for conditions other than malignant neoplasm: Secondary | ICD-10-CM

## 2019-05-24 DIAGNOSIS — C9210 Chronic myeloid leukemia, BCR/ABL-positive, not having achieved remission: Secondary | ICD-10-CM

## 2019-05-24 DIAGNOSIS — Z79899 Other long term (current) drug therapy: Secondary | ICD-10-CM

## 2019-05-24 DIAGNOSIS — R42 Dizziness and giddiness: Secondary | ICD-10-CM

## 2019-05-24 DIAGNOSIS — Z0000 Encounter for general adult medical examination without abnormal findings: Secondary | ICD-10-CM

## 2019-05-24 DIAGNOSIS — I10 Essential (primary) hypertension: Secondary | ICD-10-CM

## 2019-05-24 LAB — Differential Automated: ABSOLUTE EOS COUNT: 0.01 10*3/uL (ref 0.00–0.50)

## 2019-05-24 LAB — CBC: MCH CONCENTRATION: 30.2 g/dL — ABNORMAL LOW (ref 31.5–35.5)

## 2019-05-24 LAB — Calcium,Ionized: IONIZED CA++,UNCORRECTED: 1.25 mmol/L (ref 1.09–1.29)

## 2019-05-24 NOTE — Progress Notes
PATIENT: Lindsay Brennan  MRN: [id number]  DOB: December 25, 1934  DATE OF SERVICE: 05/24/2019    PRIMARY CARE PROVIDER: Salley Slaughter, MD    CHIEF COMPLAINT:   Chief Complaint   Patient presents with   ??? Post Hospital Follow Up     discuss dizziness         Subjective:      Lindsay Brennan is a 85 y.o. female h/o as stated below p/w   Chief Complaint   Patient presents with   ??? Post Hospital Follow Up     discuss dizziness     and the following current concerns.      Current Concerns:  Pt presents for f/u hospital discharge.    Pt was admitted to [facility] 05/10/19-05/18/19 for shortness of breath and dizziness after being found to have elevated WBC and infiltrates on CXR.  XR was deemed more likely pleural effusion given no other sxs of infection.  Dizziness still unclear.  Pt received IVF in hospital.  CML deemed not to be in acute crisis and opted to complete follow up when she returns to [place] to her regular Heme/Onc physician.  Her BP medication was changed to losartan and diltiazem was decreased which did help her initially with her dizziness.      After discharge, pt Hgb dropped to 6.8 so she went to [facility] ER and was transfused 2 units pRBCs.    She continue to have dizziness, worse with movement.    Chronic Problems:   Patient Active Problem List   Diagnosis   ??? Anemia in chronic kidney disease   ??? Chronic kidney disease due to hypertension   ??? Proteinuria   ??? Myelodysplasia (myelodysplastic syndrome) (HCC/RAF)   ??? Leukemia (HCC/RAF)   ??? Hypothyroidism   ??? Hypertension   ??? Hyperlipidemia   ??? History of ductal carcinoma in situ (DCIS) of breast   ??? History of CVA (cerebrovascular accident)   ??? Hyperleukocytosis   ??? CKD (chronic kidney disease)   ??? 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 GERD (gastroesophageal reflux disease)       Current Medications:   Current Outpatient Medications   Medication Sig   ??? allopurinol 100 mg tablet Take 1/2 tablets (50 mg total) by mouth daily.   ??? aspirin 81 mg chewable tablet Chew 1 tablet (81 mg total) by mouth daily.   ??? diclofenac Sodium 1% gel Apply 2 g topically every six (6) hours as needed (for L shoulder pain).   ??? dilTIAZem (CARDIZEM CD) 120 mg 24 hr capsule Take 1 capsule (120 mg total) by mouth daily.   ??? epoetin alfa-epbx (RETACRIT) 10,000 units/mL injection Inject 0.73 mLs (7,300 Units total) under the skin three (3) times a week.   ??? esomeprazole 40 mg capsule Take 40 mg by mouth daily .   ??? Ferric Citrate 1 GM 210 MG(Fe) TABS Take 1 tablet by mouth three (3) times daily with meals.   ??? gabapentin 100 mg capsule Take 2 capsules (200 mg total) by mouth at bedtime.   ??? guaiFENesin 100 mg/5 mL syrup Take 5 mLs (100 mg total) by mouth every six (6) hours as needed.   ??? hydroxyurea 500 mg capsule Take 2 capsules (1,000 mg total) by mouth every morning and take 1 capsule (500 mg total) by mouth every evening.   ??? hydroxyurea 500 mg capsule Take 1 capsule (500 mg total) by mouth every evening.   ??? loratadine 10  mg tablet Take ONE-HALF tablets (5 mg total) by mouth daily.   ??? losartan 50 mg tablet Take 1 tablet (50 mg total) by mouth daily.   ??? magnesium oxide 400 mg tablet Take 400 mg by mouth daily Hold for loose stools.Marland Kitchen   ??? polyethylene glycol powder packet Take 1 packet (17 g total) by mouth daily.   ??? rosuvastatin 20 mg tablet Take 20 mg by mouth at bedtime .   ??? sodium bicarbonate 650 mg tablet Take 1 tablet (650 mg total) by mouth two (2) times daily.   ??? SYNTHROID 50 mcg tablet Take 50 mcg by mouth every other day Pt alternates with 75mg .   ??? SYNTHROID 75 mcg tablet Take 75 mcg by mouth every other day Pt alternates with 50mg .     Current Facility-Administered Medications   Medication Dose Route Frequency   ??? epoetin alfa-epbx (Retacrit) 10,000 units/mL inj 10,000 Units  10,000 Units Subcutaneous Q7 Days        Allergies: Coffee, Oxycodone-acetaminophen, Penicillins, Sulfa antibiotics, and Naproxen    Past Surgical History: Procedure Laterality Date   ??? CESAREAN SECTION     ??? MASTECTOMY Bilateral    ??? TOTAL ABDOMINAL HYSTERECTOMY         Family History   Problem Relation Age of Onset   ??? Stroke Mother    ??? Emphysema Father    ??? Ovarian cancer Sister    ??? Heart attack Brother    ??? Lung cancer Sister    ??? Lung cancer Sister    ??? Colon cancer Brother    ??? Colon cancer Son        Social History     Socioeconomic History   ??? Marital status: Widowed     Spouse name: Not on file   ??? Number of children: Not on file   ??? Years of education: Not on file   ??? Highest education level: Not on file   Occupational History   ??? Not on file   Social Needs   ??? Financial resource strain: Not on file   ??? Food insecurity     Worry: Not on file     Inability: Not on file   ??? Transportation needs     Medical: Not on file     Non-medical: Not on file   Tobacco Use   ??? Smoking status: Never Smoker   ??? Smokeless tobacco: Never Used   Substance and Sexual Activity   ??? Alcohol use: Not Currently   ??? Drug use: Never   ??? Sexual activity: Not on file   Lifestyle   ??? Physical activity     Days per week: Not on file     Minutes per session: Not on file   ??? Stress: Not on file   Relationships   ??? Social Wellsite geologist on phone: Not on file     Gets together: Not on file     Attends religious service: Not on file     Active member of club or organization: Not on file     Attends meetings of clubs or organizations: Not on file     Relationship status: Not on file   Other Topics Concern   ??? Need Help Feeding Yourself? Not Asked   ??? Need Help Getting Dressed? Not Asked   ??? Need Help Using the Telephone? Not Asked   ??? Need Help Managing Money? Tree surgeon, [employer]) Not Asked   ???  Need Help Shopping for Groceries? Not Asked   ??? Need Help Getting Places Beyond Walking Distance? [employer], [employer]) Not Asked   ??? Need Help Getting from Bed to Chair? Not Asked   ??? Need Help Bathing or Showering? Not Asked   ??? Need Help Taking your Medications? Not Asked   ??? Need Help Doing Moderately Strenuous Housework? (ex. Laundry) Not Asked   ??? Need Help Driving? Not Asked   ??? Need Help Getting to the Toilet? Not Asked   ??? Need Help Walking Across the Road? (Includes Ephraim Hamburger) Not Asked   ??? Need Help Preparing Meals? Not Asked   ??? Need Help Shopping for Personal Items? (Toiletries, Medicines) Not Asked   ??? Need Help Climbing a Flight of Stairs? Not Asked   ??? Do you live with someone who assists you at home? Not Asked   ??? Do you get help from family members or friends in your home? Not Asked   ??? Do you employ someone to provide health related care or help you in your home? Not Asked   ??? Do you provide care for a family member? Not Asked   ??? Does your home have rugs in the hallway? Not Asked   ??? Does your home have poor lighting? Not Asked   ??? Does your home lack grab bars in the bathroom? Not Asked   ??? Does your home lack handrails on the stairs? Not Asked   ??? Have you noticed any hearing difficulties? Not Asked   ??? Do you currently participate in any regular activity to improve or maintain your physical fitness? Not Asked   ??? Do you always wear a seatbelt when you ride in a car? Not Asked   ??? If you drink alcohol, do you drink more than 7 drinks per week or more than 3 drinks on any given day? Not Asked   ??? Has anyone ever been concerned about your drinking? Not Asked   ??? Do you exercise at least a day, 3 or more days a week? No   ??? Types of Exercise? (List in Comments) No   ??? Do you follow a special diet? No   ??? Vegan? No   ??? Vegetarian? No   ??? Pescatarian? No   ??? Lactose Free? No   ??? Gluten Free? No   ??? Omnivore? No   Social History Narrative   ??? Not on file       Review of Systems: A 14-system review of systems was performed and is negative except as stated in the history of present illness.  Constitutional: negative for chills, fevers, night sweats and weight loss  ENT: negative bad breath  Eyes: negative discharge from eyes  Respiratory: negative for cough, dyspnea on exertion and wheezing  Cardiovascular: negative for chest pain, dyspnea, lower extremity edema and palpitations  GI: negative nausea, vomiting, diarrhea  GU: no urinary incontinence  MSK: negative muscle twitching  Integumentary: negative cracking of skin  Neurological: negative syncope or disorientation  Psychiatric: negative nervousness  Endocrine: negative excessive thirst  Heme/Lymph: negative enlarged lymph nodes  Allergic/Immunologic: negative hives       Objective:        BP 154/60  ~ Pulse 98  ~ Temp 36.1 ???C (96.9 ???F)  ~ Ht 5' 1'' (1.549 m)  ~ Wt 112 lb (50.8 kg)  ~ BMI 21.16 kg/m???     General: thin in NAD, cooperative  Head: NC/AT  Eyes: EOMI, conjunctiva clear  Ears: normal TMs and external canals b/l  Neck: supple, trachea midline  Lungs: CTAB, no w/r/r  Heart: RRR, S1 and S2 normal, no m/r/g  Neuro: Grossly intact  Psychiatric: answers appropriately, mood and affect WNLs    Lab Review:   Admission on 05/20/2019, Discharged on 05/21/2019   Component Date Value   ??? Emer. Dept. Info Exchang* 05/20/2019 2    ??? Emer. Dept. Info Exchang* 05/20/2019 3    ??? BNP 05/20/2019 91    ??? Sodium 05/20/2019 133*   ??? Potassium 05/20/2019 5.0    ??? Chloride 05/20/2019 102    ??? Total CO2 05/20/2019 20    ??? Anion Gap 05/20/2019 11    ??? Glucose 05/20/2019 102*   ??? Creatinine 05/20/2019 1.78*   ??? GFR Estimate for African* 05/20/2019 30    ??? GFR Estimate for Non-Afr* 05/20/2019 26    ??? GFR Additional Informati* 05/20/2019 See Comment    ??? Urea Nitrogen 05/20/2019 60*   ??? Calcium 05/20/2019 8.8    ??? Prothrombin Time 05/20/2019 16.0*   ??? INR 05/20/2019 1.4    ??? APTT 05/20/2019 41.1*   ??? Creatine Phosphokinase, * 05/20/2019 15*   ??? Troponin I 05/20/2019 <0.04    ??? Troponin Interpretation 05/20/2019 Negative    ??? ABORh 05/20/2019 O Positive    ??? Antibody Screen 05/20/2019 Negative    ??? Unit Blood Type 05/21/2019 O Pos    ??? Unit Number 05/21/2019 [id number]    ??? Status 05/21/2019 Transfused    ??? Product Code 05/21/2019 [id number]    ??? Unit Blood Type 05/21/2019 O Pos    ??? Unit Number 05/21/2019 [id number]    ??? Status 05/21/2019 Transfused    ??? Product Code 05/21/2019 [id number]    ??? Crossmatch Result 05/21/2019 Compatible    ??? Product ID 05/21/2019 Red Blood Cells    ??? Crossmatch Result 05/21/2019 Compatible    ??? Product ID 05/21/2019 Red Blood Cells    ??? Blood Bank Hold Specimen 05/18/2019 Available    ??? White Blood Cell Count 05/20/2019 5.38    ??? Red Blood Cell Count 05/20/2019 2.46*   ??? Hemoglobin 05/20/2019 6.8*   ??? Hematocrit 05/20/2019 22.9*   ??? Mean Corpuscular Volume 05/20/2019 93.1    ??? Mean Corpuscular Hemoglo* 05/20/2019 27.6    ??? MCH Concentration 05/20/2019 29.7*   ??? Red Cell Distribution Wi* 05/20/2019 68.4*   ??? Red Cell Distribution Wi* 05/20/2019 20.9*   ??? Platelet Count, Auto 05/20/2019 71*   ??? Mean Platelet Volume 05/20/2019     ??? Nucleated RBC%, automated 05/20/2019 0.7    ??? Absolute Nucleated RBC C* 05/20/2019 0.04*   ??? Neutrophil Abs (Prelim) 05/20/2019 3.81    ??? Neutrophil Percent, Auto 05/20/2019 70.7    ??? Lymphocyte Percent, Auto 05/20/2019 13.2    ??? Monocyte Percent, Auto 05/20/2019 12.5    ??? Eosinophil Percent, Auto 05/20/2019 0.0    ??? Basophil Percent, Auto 05/20/2019 1.9    ??? Immature Granulocytes% 05/20/2019 1.7    ??? Absolute Neut Count 05/20/2019 3.81    ??? Absolute Lymphocyte Count 05/20/2019 0.71*   ??? Absolute Mono Count 05/20/2019 0.67    ??? Absolute Eos Count 05/20/2019 0.00    ??? Absolute Baso Count 05/20/2019 0.10    ??? Absolute Immature Gran C* 05/20/2019 0.09*   ??? Blood Type 05/18/2019 Confirmed    Lab Visit on 05/20/2019   Component Date Value   ??? Sodium 05/20/2019 134*   ??? Potassium  05/20/2019 4.5    ??? Chloride 05/20/2019 100    ??? Total CO2 05/20/2019 17*   ??? Anion Gap 05/20/2019 17    ??? Glucose 05/20/2019 109*   ??? Creatinine 05/20/2019 1.62*   ??? GFR Estimate for African* 05/20/2019 34    ??? GFR Estimate for Non-Afr* 05/20/2019 29    ??? GFR Additional Informati* 05/20/2019 See Comment    ??? Urea Nitrogen 05/20/2019 58*   ??? Calcium 05/20/2019 8.6    ??? Ionized Ca++,Uncorrected 05/20/2019 1.17    ??? Ionized Ca++,Corrected 05/20/2019 1.11    ??? Phosphorus 05/20/2019 4.5*   ??? Uric Acid 05/20/2019 9.1*   ??? White Blood Cell Count 05/20/2019 5.36    ??? Red Blood Cell Count 05/20/2019 2.47*   ??? Hemoglobin 05/20/2019 6.8*   ??? Hematocrit 05/20/2019 22.6*   ??? Mean Corpuscular Volume 05/20/2019 91.5    ??? Mean Corpuscular Hemoglo* 05/20/2019 27.5    ??? MCH Concentration 05/20/2019 30.1*   ??? Red Cell Distribution Wi* 05/20/2019 67.4*   ??? Red Cell Distribution Wi* 05/20/2019 21.0*   ??? Platelet Count, Auto 05/20/2019 67*   ??? Mean Platelet Volume 05/20/2019     ??? Nucleated RBC%, automated 05/20/2019 0.7    ??? Absolute Nucleated RBC C* 05/20/2019 0.04*   ??? Neutrophil Abs (Prelim) 05/20/2019 3.93    ??? Neutrophil Percent, Auto 05/20/2019 73.4    ??? Lymphocyte Percent, Auto 05/20/2019 13.2    ??? Monocyte Percent, Auto 05/20/2019 10.8    ??? Eosinophil Percent, Auto 05/20/2019 0.0    ??? Basophil Percent, Auto 05/20/2019 1.5    ??? Immature Granulocytes% 05/20/2019 1.1    ??? Absolute Neut Count 05/20/2019 3.93    ??? Absolute Lymphocyte Count 05/20/2019 0.71*   ??? Absolute Mono Count 05/20/2019 0.58    ??? Absolute Eos Count 05/20/2019 0.00    ??? Absolute Baso Count 05/20/2019 0.08    ??? Absolute Immature Gran C* 05/20/2019 0.06*   No results displayed because visit has over 200 results.      No results displayed because visit has over 200 results.      Lab Visit on 05/03/2019   Component Date Value   ??? Sodium 05/03/2019 139    ??? Potassium 05/03/2019 3.5*   ??? Chloride 05/03/2019 101    ??? Total CO2 05/03/2019 24    ??? Anion Gap 05/03/2019 14    ??? Glucose 05/03/2019 79    ??? Creatinine 05/03/2019 2.33*   ??? GFR Estimate for African* 05/03/2019 22    ??? GFR Estimate for Non-Afr* 05/03/2019 19    ??? GFR Additional Informati* 05/03/2019 See Comment    ??? Urea Nitrogen 05/03/2019 46*   ??? Calcium 05/03/2019 9.6    ??? White Blood Cell Count 05/03/2019 57.57*   ??? Red Blood Cell Count 05/03/2019 3.46*   ??? Hemoglobin 05/03/2019 9.5*   ??? Hematocrit 05/03/2019 32.6*   ??? Mean Corpuscular Volume 05/03/2019 94.2    ??? Mean Corpuscular Hemoglo* 05/03/2019 27.5    ??? MCH Concentration 05/03/2019 29.1*   ??? Red Cell Distribution Wi* 05/03/2019 79.7*   ??? Red Cell Distribution Wi* 05/03/2019 24.1*   ??? Platelet Count, Auto 05/03/2019 302    ??? Mean Platelet Volume 05/03/2019     ??? Nucleated RBC%, automated 05/03/2019 5.2    ??? Absolute Nucleated RBC C* 05/03/2019 3.01*   ??? Neutrophil Abs (Prelim) 05/03/2019 30.28    ??? Segmented Neutrophil 05/03/2019 42    ??? Band Neutrophil 05/03/2019 18    ???  Lymphocyte 05/03/2019 1    ??? Monocyte 05/03/2019 24    ??? Eosinophil 05/03/2019 1    ??? Basophil 05/03/2019 4    ??? Metamyelocyte 05/03/2019 2    ??? Myelocyte 05/03/2019 6    ??? Blast Cell 05/03/2019 2*   ??? Absolute Neut Ct, Manual 05/03/2019 34.5*   ??? Absolute Lymphocyte Ct,M* 05/03/2019 0.6*   ??? Absolute Mono Ct,Manual 05/03/2019 13.8*   ??? Absolute Eos Ct,Manual 05/03/2019 0.6*   ??? Absolute Baso Ct,Manual 05/03/2019 2.3*   ??? Absolute Metamyelo Ct,Ma* 05/03/2019 1.2*   ??? Absolute Myelocyte Ct, M* 05/03/2019 3.5*   ??? Absolute Blast Ct,Manual 05/03/2019 1.2*   ??? Macrocytosis 05/03/2019 Slight    ??? Polychromasia 05/03/2019 Moderate    ??? Spherocytosis 05/03/2019 Slight    ??? Schistocytes 05/03/2019 None Seen    ??? Teardrop Cells 05/03/2019 Slight    ??? Ovalocytes 05/03/2019 Slight    ??? Burr Cells 05/03/2019 Slight    ??? Platelet Estimation 05/03/2019 Adequate    Office Visit on 05/03/2019   Component Date Value   ??? Magnesium 05/03/2019 1.7    ??? White Blood Cell Count 05/03/2019 56.68*   ??? Red Blood Cell Count 05/03/2019 3.45*   ??? Hemoglobin 05/03/2019 9.5*   ??? Hematocrit 05/03/2019 32.7*   ??? Mean Corpuscular Volume 05/03/2019 94.8    ??? Mean Corpuscular Hemoglo* 05/03/2019 27.5    ??? MCH Concentration 05/03/2019 29.1*   ??? Red Cell Distribution Wi* 05/03/2019 80.5*   ??? Red Cell Distribution Wi* 05/03/2019 24.3*   ??? Platelet Count, Auto 05/03/2019 281    ??? Mean Platelet Volume 05/03/2019     ??? Nucleated RBC%, automated 05/03/2019 5.5    ??? Absolute Nucleated RBC C* 05/03/2019 3.09*   ??? Neutrophil Abs (Prelim) 05/03/2019 29.22    ??? Segmented Neutrophil 05/03/2019 60    ??? Band Neutrophil 05/03/2019 13    ??? Lymphocyte 05/03/2019 6    ??? Monocyte 05/03/2019 6    ??? Basophil 05/03/2019 7    ??? Metamyelocyte 05/03/2019 2    ??? Myelocyte 05/03/2019 2    ??? Blast Cell 05/03/2019 4*   ??? Absolute Neut Ct, Manual 05/03/2019 41.4*   ??? Absolute Lymphocyte Ct,M* 05/03/2019 3.4    ??? Absolute Mono Ct,Manual 05/03/2019 3.4*   ??? Absolute Baso Ct,Manual 05/03/2019 4.0*   ??? Absolute Metamyelo Ct,Ma* 05/03/2019 1.1*   ??? Absolute Myelocyte Ct, M* 05/03/2019 1.1*   ??? Absolute Blast Ct,Manual 05/03/2019 2.3*   ??? Macrocytosis 05/03/2019 Slight    ??? Polychromasia 05/03/2019 Moderate    ??? Spherocytosis 05/03/2019 Slight    ??? Schistocytes 05/03/2019 None Seen    ??? Teardrop Cells 05/03/2019 Slight    ??? Ovalocytes 05/03/2019 Slight    ??? Burr Cells 05/03/2019 Slight    ??? Platelet Estimation 05/03/2019 Adequate    Lab Visit on 04/30/2019   Component Date Value   ??? Magnesium 04/30/2019 1.5    ??? Albumin 04/30/2019 4.0    ??? Prot/Creat Ratio,Ur 04/30/2019 1.1*   ??? Protein,Urine 04/30/2019 107    ??? Creatinine,Random Urine 04/30/2019 94.5    ??? Iron 04/30/2019 50    ??? Iron Binding Capacity 04/30/2019 213*   ??? % Saturation 04/30/2019 23    ??? Ferritin 04/30/2019 426*   ??? Phosphorus 04/30/2019 2.9    ??? Sodium 04/30/2019 136    ??? Potassium 04/30/2019 3.4*   ??? Chloride 04/30/2019 97    ??? Total CO2 04/30/2019 22    ??? Anion  Gap 04/30/2019 17    ??? Glucose 04/30/2019 83    ??? Creatinine 04/30/2019 2.31*   ??? GFR Estimate for African* 04/30/2019 22    ??? GFR Estimate for Non-Afr* 04/30/2019 19    ??? GFR Additional Informati* 04/30/2019 See Comment    ??? Urea Nitrogen 04/30/2019 47*   ??? Calcium 04/30/2019 9.3    ??? PTH, Intact 04/30/2019 86*   ??? Calcium 04/30/2019 9.1    ??? Potassium,Random Urine 04/30/2019 49    Lab Visit on 04/19/2019   Component Date Value   ??? Uric Acid 04/19/2019 11.9*   ??? Magnesium 04/19/2019 1.6    ??? Phosphorus 04/19/2019 4.1    ??? Ionized Ca++,Uncorrected 04/19/2019 1.20    ??? Ionized Ca++,Corrected 04/19/2019 1.14    ??? Sodium 04/19/2019 139    ??? Potassium 04/19/2019 3.8    ??? Chloride 04/19/2019 101    ??? Total CO2 04/19/2019 23    ??? Anion Gap 04/19/2019 15    ??? Glucose 04/19/2019 96    ??? Creatinine 04/19/2019 2.06*   ??? GFR Estimate for African* 04/19/2019 25    ??? GFR Estimate for Non-Afr* 04/19/2019 22    ??? GFR Additional Informati* 04/19/2019 See Comment    ??? Urea Nitrogen 04/19/2019 47*   ??? Calcium 04/19/2019 9.2    ??? Prot/Creat Ratio,Ur 04/19/2019 1.4*   ??? Protein,Urine 04/19/2019 76    ??? Creatinine,Random Urine 04/19/2019 55.5    ??? Sodium,Random Urine 04/19/2019 48    ??? Urea Nitrogen,Random Uri* 04/19/2019 457    ??? Uric Acid,Random Urine 04/19/2019 56.6    ??? Urine Color 04/19/2019 Light-Yellow    ??? Specific Gravity 04/19/2019 1.012    ??? pH,Urine 04/19/2019 5.5    ??? Blood 04/19/2019 Trace*   ??? Bilirubin 04/19/2019 Negative    ??? Ketones 04/19/2019 Negative    ??? Glucose 04/19/2019 Negative    ??? Protein 04/19/2019 1+*   ??? Leukocyte Esterase 04/19/2019 Negative    ??? Nitrite 04/19/2019 Negative    ??? RBC per uL 04/19/2019 0    ??? WBC per uL 04/19/2019 5    ??? RBC per HPF 04/19/2019 0    ??? WBC per HPF 04/19/2019 1    ??? Bacteria 04/19/2019 Present*   ??? Squamous Epi Cells 04/19/2019 0    ??? Hyaline Casts 04/19/2019 3-5/LPF*   ??? Granular Casts 04/19/2019 Present*   Office Visit on 03/29/2019   Component Date Value   ??? Glucose, Serum ([facility]) 04/12/2019 85    ??? BUN ([facility]) 04/12/2019 50*   ??? Creatinine, Serum ([facility]* 04/12/2019 3.20*   ??? eGFR If NonAfricn Am ([facility]* 04/12/2019 13*   ??? eGFR If Africn Am ([facility]* 04/12/2019 15*   ??? BUN/Creatinine Ratio ([facility]* 04/12/2019 16    ??? Sodium, Serum ([facility]) 04/12/2019 138    ??? Potassium, Serum ([facility]* 04/12/2019 4.2    ??? Chloride, Serum ([facility]) 04/12/2019 100    ??? Carbon Dioxide, Total (L* 04/12/2019 21    ??? Calcium, Serum ([facility]) 04/12/2019 8.9    ??? Magnesium, Serum ([facility]* 04/12/2019 1.9    ??? Calcium, Serum ([facility]) 04/12/2019 9.2    ??? PTH (Intact Assay), Seru* 04/12/2019 68*   ??? Phosphorus, Serum ([facility]* 04/12/2019 4.7*   ??? Ferritin, Serum ([facility]) 04/12/2019 566*   ??? Iron Bind.Cap.(TIBC) ([facility]* 04/12/2019 237*   ??? UIBC ([facility]) 04/12/2019 69*   ??? Iron, Serum ([facility]) 04/12/2019 168*   ??? Iron Saturation ([facility]) 04/12/2019 71*   ??? Creatinine, Urine ([facility]* 04/12/2019  132.8    ??? Protein,Total,Urine (Lab* 04/12/2019 158.3    ??? Protein/Creat Ratio (Lab* 04/12/2019 1,192*   ??? Albumin, Serum ([facility]) 04/12/2019 3.8    ??? Hemoglobin ([facility]) 04/12/2019 9.9*        Assessment & Plan:          85 y.o. 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 had concerns including Post Hospital Follow Up (discuss dizziness )., p/w f/u hospital discharge.    1. Hospital discharge follow-up  New.  Recent hospitalization [facility] 04/05-13/21 for possible respiratory failure in setting of leukocytosis, found most likely pleural effusion.  -continue f/u Heme/Onc  -continue f/u Nephrology    2. Dizziness  New, x2 mos.  Possible medication side effect vs intracranial etiology vs elevated BUN from poor renal function vs thyroid issue given not controlled vs other.  - CT brain wo contrast; Future  - Referral to Clinical Pharmacist/[facility]    3. Polypharmacy  - Referral to Clinical Pharmacist/[facility]    4. 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  -continue mgmt per Heme/Onc    5. Essential hypertension Uncontrolled.  -continue mgmt per nephrology: diltiazem 120mg  and losartan 50mg     6. Acquired hypothyroidism  Uncontrolled.  TSH 5.2.  -discussed levothyroxine alternating with levothyroxine every other day  -discussed taking it by itself with only water    #Routine Health Maintenance:  Health Maintenance Topics with due status: Not Due       Topic Last Completion Date    Influenza Vaccine 10/22/2010     Health Maintenance Topics with due status: Completed       Topic Last Completion Date    Statin prescribed for ASCVD Prevention or Treatment 03/11/2019    COVID-19 Vaccine 03/24/2019       Health Maintenance Due   Topic Date Due   ??? Tdap/Td Vaccine (1 - Tdap) Never done   ??? Shingles (Shingrix) Vaccine (1 of 2) Never done   ??? Annual Preventive Wellness Visit  Never done   ??? Advance Directive  Never done   ??? Pneumococcal Vaccine (1 of 2 - PCV13) Never done   ??? Osteoporosis Early Detection DEXA Scan  Never done       Mammogram: No results found. n/a  Pap: n/a  Colon CA: 08/2018 polyp  Immunizations: Flu: n/a Tdap: per pt UTD Pneumo per pt UTD Zoster defer  DEXA: per pt neg  Hx of Smoking/Abd Ultrasound  (M>65yo any smoking)/annual CTscan (M/F >50yo 20pack year and smoked past 15 yrs): n/a  HCV: due next lab    Follow up:   Return if symptoms worsen or fail to improve.          Author:  Sharen Counter, MD 05/24/2019 10:54 AM      The above plan of care, diagnosis, orders, and follow-up were discussed with the patient.  Questions related to this recommended plan of care were answered.

## 2019-05-24 NOTE — Telephone Encounter
EPO has been orders under previous encounter 3/29. The system doesnt allow me to create another encounter. Thanks.

## 2019-05-24 NOTE — Patient Instructions
[place]  [address]  [phone]

## 2019-05-24 NOTE — Telephone Encounter
Call Back Request    MD:  Dr. Hewitt Shorts     Reason for call back: pt's daughter would like a callback as soon as possible to schedule the injections for Retacrit.     Any Symptoms:  []  Yes  [x]  No      ? If yes, what symptoms are you experiencing:    o Duration of symptoms (how long):    o Have you taken medication for symptoms (OTC or Rx):      Patient or caller has been notified of the 24-48 hour turnaround time.

## 2019-05-25 LAB — Basic Metabolic Panel
GFR ESTIMATE FOR NON-AFRICAN AMERICAN: 28 mL/min/{1.73_m2} (ref 0.60–1.30)
UREA NITROGEN: 54 mg/dL — ABNORMAL HIGH (ref 7–22)

## 2019-05-25 LAB — Phosphorus: PHOSPHORUS: 3.2 mg/dL (ref 2.3–4.4)

## 2019-05-25 LAB — Uric Acid: URIC ACID: 7.2 mg/dL — ABNORMAL HIGH (ref 2.9–7.0)

## 2019-05-25 NOTE — Telephone Encounter
Done  sch

## 2019-05-25 NOTE — Telephone Encounter
PDL Call to Practice    Reason for Call: returning call to schedule Retacrit injection; [facility] does not schedule  MD: Boonpheng    Appointment Related?  []  Yes  [x]  No     If yes;  Date:  Time:    Call warm transferred to PDL: [x]  Yes  []  No    Call Received by Practice Representative: Dago

## 2019-05-26 ENCOUNTER — Ambulatory Visit: Payer: Commercial Managed Care - Pharmacy Benefit Manager

## 2019-05-26 ENCOUNTER — Telehealth: Payer: MEDICARE

## 2019-05-26 ENCOUNTER — Inpatient Hospital Stay: Payer: MEDICARE

## 2019-05-26 DIAGNOSIS — R42 Dizziness and giddiness: Secondary | ICD-10-CM

## 2019-05-26 NOTE — Telephone Encounter
Dr. Sherrian Divers,     Thank you for your recent referral; reached out to   Lindsay Brennan to schedule an appt with our [facility] clinical pharmacist.      Video consultation is scheduled for 06/03/2019 at 11:20AM with Dr. Kimberlee Nearing, PharmD.     Patient instructed to have all medication bottles and medication lists ready prior to the telephone call.  Patient will be given a reminder call by [facility] the day before appointment.     Best,  Magdalen Spatz   [facility]

## 2019-05-27 ENCOUNTER — Institutional Professional Consult (permissible substitution): Payer: MEDICARE

## 2019-05-27 ENCOUNTER — Ambulatory Visit: Payer: PRIVATE HEALTH INSURANCE

## 2019-05-27 DIAGNOSIS — N189 Chronic kidney disease, unspecified: Secondary | ICD-10-CM

## 2019-05-28 ENCOUNTER — Ambulatory Visit: Payer: PRIVATE HEALTH INSURANCE

## 2019-05-28 ENCOUNTER — Telehealth: Payer: PRIVATE HEALTH INSURANCE

## 2019-05-28 NOTE — Progress Notes
Outpatient Hematology/Oncology Progess Note    Patient name:  Lindsay Brennan  MRN:  [id number]  DOB:  1934-10-11  CSN: [id number]  Date of encounter: 05/28/2019   Provider: Caroline Sauger K. Pacer Dorn, DO    Reason for appointment: hospital follow up  Underlying disease: hyperleukocytosis    HPI:   Lindsay Brennan is a 85 y.o. 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 Per the patient, lightheadedness has time correlation with diltiazem, improving with decrease in the dosage, and only occurring when she rises from seated position. She was planning to go back home to [place] but mentioned dizziness to her family who reportedly found her O2 saturation to be 80's at home, prompting admission. She says she only has dyspnea when lying flat, but improves in supine. She also endorses cough for the past 2 days. COVID negative.   ???  [facility], evaluated by oncology service, started on IVF and allopurinol for uric acid 11.8. other notable labs include LDH 1426, d-dimer 2.4, fibrinogen 271, PT 20.4  ???  CXR demonstrated bilateral lower lobe consolidations  abd [place] with marked splenomegaly  ???  05/11/19 transferred to [facility] to geriatrics service.   Patient reports weight loss, occasional night sweats, early satiety. Continues to have dizziness and SOB with exertion.   ???  05/12/19 started hydrea 500mg  BID yesterday. Breathing and dizziness is stable  05/13/19 no overnight events WBC down to 115  05/14/19 dizziness has resolved, WBC stable. Still with DOE. No fevers, dry cough which is unchanged. Increased hydrea to 1000mg  BID  05/15/19:  Denies dizziness.  Denies sob.  Still with some doe.  Denies fevers.  WBC 91, Hgb 7.7. started retacrit 7,300units T/Th/Sat  05/16/19:  No complaints.  WBC 65, Hgb 7.8, uric acid 9.2, Cr 1.74  05/17/19: SOB and dizziness are mild/stable. WBC 30, hgb 7.4, platelets 132, uric acid 10.  05/20/19: ED for [facility] transfusion for  hgb 6.8. hydrea held    Subjective:  Here for follow up. Still having dizziness. BP elevated today, no HA or CP.  hgb 9.9    Objective:  Current medications:  Outpatient Medications Prior to Visit   Medication Sig   ??? allopurinol 100 mg tablet Take 1/2 tablets (50 mg total) by mouth daily.   ??? aspirin 81 mg chewable tablet Chew 1 tablet (81 mg total) by mouth daily.   ??? diclofenac Sodium 1% gel Apply 2 g topically every six (6) hours as needed (for L shoulder pain).   ??? dilTIAZem (CARDIZEM CD) 120 mg 24 hr capsule Take 1 capsule (120 mg total) by mouth daily.   ??? epoetin alfa-epbx (RETACRIT) 10,000 units/mL injection Inject 0.73 mLs (7,300 Units total) under the skin three (3) times a week.   ??? esomeprazole 40 mg capsule Take 40 mg by mouth daily .   ??? Ferric Citrate 1 GM 210 MG(Fe) TABS Take 1 tablet by mouth three (3) times daily with meals.   ??? gabapentin 100 mg capsule Take 2 capsules (200 mg total) by mouth at bedtime.   ??? guaiFENesin 100 mg/5 mL syrup Take 5 mLs (100 mg total) by mouth every six (6) hours as needed.   ??? hydroxyurea 500 mg capsule Take 2 capsules (1,000 mg total) by mouth every morning and take 1 capsule (500 mg total) by mouth every evening.   ???  hydroxyurea 500 mg capsule Take 1 capsule (500 mg total) by mouth every evening.   ??? loratadine 10 mg tablet Take ONE-HALF tablets (5 mg total) by mouth daily.   ??? losartan 50 mg tablet Take 1 tablet (50 mg total) by mouth daily.   ??? magnesium oxide 400 mg tablet Take 400 mg by mouth daily Hold for loose stools.Marland Kitchen   ??? polyethylene glycol powder packet Take 1 packet (17 g total) by mouth daily.   ??? rosuvastatin 20 mg tablet Take 20 mg by mouth at bedtime .   ??? sodium bicarbonate 650 mg tablet Take 1 tablet (650 mg total) by mouth two (2) times daily.   ??? SYNTHROID 50 mcg tablet Take 50 mcg by mouth every other day Pt alternates with 75mg .   ??? SYNTHROID 75 mcg tablet Take 75 mcg by mouth every other day Pt alternates with 50mg .     Facility-Administered Medications Prior to Visit   Medication   ??? epoetin alfa-epbx (Retacrit) 10,000 units/mL inj 10,000 Units       PMHx:   Past Medical History:   Diagnosis Date   ??? CKD (chronic kidney disease)    ??? 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 GERD (gastroesophageal reflux disease)    ??? History of CVA (cerebrovascular accident) 2011   ??? History of ductal carcinoma in situ (DCIS) of breast     s/p b/l mastectomy   ??? Hyperlipidemia    ??? Hypertension    ??? Hypothyroidism    ??? Leukemia (HCC/RAF)    ??? Myelodysplasia (myelodysplastic syndrome) (HCC/RAF)        PSHX:  Past Surgical History:   Procedure Laterality Date   ??? CESAREAN SECTION     ??? MASTECTOMY Bilateral    ??? TOTAL ABDOMINAL HYSTERECTOMY         Allergies:  Allergies   Allergen Reactions   ??? Coffee      Reported to RD   ??? Oxycodone-Acetaminophen Other (See Comments)   ??? Penicillins Hives   ??? Sulfa Antibiotics Agranulocytosis   ??? Naproxen Rash     Aleve       Review of Systems:  A complete 14 point review of systems has been is negative except for as documented.    Vitals:  BP (!) 190/68 Comment: 1st bp 190/68 2nd bp 192/78 MD is aware ~ Pulse 75  ~ Temp (!) 35.9 ???C (96.6 ???F) (Forehead)  ~ Resp 18  ~ Ht 154.9 cm  ~ Wt 46.9 kg (103 lb 6.4 oz)  ~ SpO2 96%  ~ BMI 19.54 kg/m???   Reviewed and documented in clinic chart.    Physical Exam:  General: Appears well-developed, well-nourished and close to stated age.   Head: Normocephalic, atraumatic.  CV: Regular in rate and rhythm, no murmurs or gallops.   Chest: Clear to auscultation bilaterally without wheezing or rhonchi. No crackles noted. Respiratory effort appears normal.   Extremities: No edema. No cyanosis. Extremities are warm and well-perfused.     Review of Data:  I have  [x]  Reviewed/ordered []  1 []  2 [x]  ? 3 unique laboratory, radiology, and/or diagnostic tests noted below   [x]  Reviewed [x]  1 []  2 []  ? 3 prior external notes and incorporated into patient assessment   []  Discussed management or test interpretation with external provider(s) as noted     Recent Labs:  Lab Results   Component Value Date    WBC 10.8 (H) 05/28/2019  HGB 9.9 (L) 05/28/2019    HCT 32.3 (L) 05/28/2019    MCV 91.5 05/28/2019    PLT 226 05/28/2019     Lab Results   Component Value Date    ALT 16 05/14/2019    AST 37 05/14/2019    ALKPHOS 202 (H) 05/14/2019    BILITOT 0.4 05/14/2019     Lab Results   Component Value Date    CREAT 1.68 (H) 05/24/2019    BUN 54 (H) 05/24/2019    NA 135 05/24/2019    K 4.1 05/24/2019    CL 103 05/24/2019    CO2 21 05/24/2019     Flow cytometry  INTERPRETATION:  Small population of myeloblasts (1% of total events)   ???  COMMENT:  Marked leukocytosis with neutrophilia, monocytosis and rare blasts. Please correlate with FISH studies.  ???  Pertinent imaging:  05/10/19 abd US  IMPRESSION:  1. Marked splenomegaly. ???Mild hepatomegaly.  2. Borderline right hydronephrosis.  ???  05/09/19 CXR  IMPRESSION:  ???  Stable cardiomediastinal silhouette with mildly tortuous, calcified thoracic aorta. Persistent mild cardiomegaly.  Normal lung volumes. Increased density of the bilateral lower lung zones is accentuated due to overlying breast implants.  There are bilateral lower lobe patchy consolidations, most suggestive of pulmonary edema versus pneumonia.  ???No right pleural effusion. Trace left pleural effusion. No pneumothorax. Elevation of the left hemidiaphragm  No acute osseous abnormalities.  ???  ???  Pertinent pathology:  Patlent Name: GINNY, LOOMER Accession#: [id number] Med. Rec. # [id number] Taken: 10/30/2016 Encounter #: [id number] Received: 10/30/2016 Gender: F Reported: 11/04/2016 16:53 DOB: 1934/10/09 (Age: 23)  Submitting Physician: Hubert Azure D M.D.  Additional Physician(s):  [facility]  Clinical Information:  85 year old with remota history of mild pancytopenia in 2008. There ara no recent history or CBC studies. Specimen Received:  A: Bone marrow,aspirate for clot section, pic,cyto sent to iu B: Bone marrow, biopsy (decalelfied),Ipic  ???  Final  Pathologic Diagnosis:  id B. Bone marrow, bio and aspirate for clot section, Jeft posterioriliac crest: Myeloid neoplasm. See note.  As the senior physician, ~ attest that I: (i) examined the relevant preparation(s) for the specimen(s); and (ii) rendered or confirmed the diagnosis(es).  ???Electronically Signed ngz1/10/30/2016 Jobie Quaker, M.D. (Pathologist)  Deanne Coffer, M.D. 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 Clinical correlation and correlation with cytogenetics/FISH study including BCR-ABL7 fusion study is recommended for further classification.  Dr. Chipper Herb has reviewed the bone marrow aspirate results in [employer], which reparted as ???CMML-1???.  ???  Cellularity: Markedly hypercellular (> 90%) Myslopoiesis: Markedly increased with full maturation Erythropoiesis: Decreased Megakaryopoiesis: Markedly increased with frequent hypolobated/monolobated megakaryocytes including small hyperchromatic forms Additional studles: Reticulin stain shows mild increasein reticulin fibrosis (1+) with appropriate control,  Theimmunohistechemical stains are performedin this tase and same of the antigens may also ba evaluated by flow cytometry, Concument immunchistechemieal stains on tissue sections are Indicated in order to correlate immunophenotype with cell morphology, evaluate spatial dletribution/architectural features and/or for a quantitation of disease involvement  Bone marrow,aspirate smear:  Sample quality: Adequate  ME ratio: Markedly increased, ~ 8:1 Blasts: Not increased (4%) ' Myelopoiesis: Markedly increased with occasional hypolobated neutrophils Erythropoiesis: Decreased with megaloblastoid changes Megakaryopoiesis: Markedly increased with frequent hypolobated/monolobated megakaryocytes Comments: No clusters of blasts, no Auer rods. Additional studies: Iron stain performed at the [facility] showsincreased storage iron with no ring sideroblasts. Control  stains appropriately,  Bone marrow differential:  Percent  Blasts 2  Promyeltocytes 7  Myelocytes 23  Metamyelocytes 6  Bands 16  Polys 30  Lymphocytes 3  Monocytes 2  Eosinophils 4  Basophils 1  Nucleated RBCs 10  Peripheral blood, smear:  CBC: WBC 46 k/cumm, hemoglobIn 10,7 g/dL, MCV 81 fL, ROW 45.4%, platelet count 316 k/cumm Differential: Neutrophils 57%, lymphocytes 5%, monocytes 21%, myelocytas 3%, Metamyelocytes 8%, band 6%  RBCs: Normocytic anaemia with anlsopgikilocytosis. WEBCs: Leucocytosis, granulocytosis with left-shift, monocytosis. Platelets: Normal platelet caunt with occasional giantplatelets  ???  Results-Comments  CD34 and p53 stains show rare CD34+ blasts (<5%) and exceedingly rare p53+ cells. Controls stain appropriately.  ???  INTERERETATION: (600 cell diff)  ???  Bone Marrow Aspirate specimen is adequate and is markedly hypercellular. Spicules are present.  Megakaryocytes Are increaged in numberwith occasional small mono/hypolobated forms and rare small separated lobe forms identified. My elopoiesis Is abnormal, increased, and proceeds to completion. Blasts and promonocytes comprise 6% of total nucleated cells. : Blast clusters are noted near the particle areas, Erythropoiesis ???_Is decreased (erythroid hypoplasia). The M/E ratio is 8.871.  Erythroid series Shows occasional megaloblastoid changes.  Lymphoid Lymphocytes comprise 2% of total nucleated cells,  Comments: Correlate with bone marrow biopsy and cytogenetic studies,  Tron Stain Results: +2/6 Iron in reticulum, no ringed formsidentified.  Control: positive.  IMPRESSION: DATESIGNED OUT: 11/01/2016 TIME: 10:10 CMML-1.  COMMENT: The marrow biopsy was not available at the time of finalizing the aspirate report. In the event of a marrow biopsy report with discrepant results an addendum will be provided by [facility].  ???  My blast count was 8% ( 300 cell differential) with 15% monocytes; trilineage dysplasia; increased numbers of megakaryocytes and 90-100% cellular particles.  Becauseofthe high WBC I did my counting very close to the particles to void any dilution effect from the peripheral blood.  1% blasts in PBS and 19% monocytes.  JMB    Impression and Recommendations:  Dylann Layne is a 85 y.o. 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  ???  Started on hydrea 500mg  BID initially, then increased to 1000mg  BID. Held on 05/20/19 for anemia/thrombocytopenia requiring pRBC transfusion  Repeat CBC today shows resolution of thrombocytopenia, improved anemia. Will resume hydrea 500mg  daily for now. Repeat labs in 1 week and titrate up as needed/tolerated  Continue allopurinol  Will contact PCP and nephrology regarding uncontrolled BP   Her dizziness was initially thought to have been related to hyperleukocytosis, as there has been no notable improvement with control of her WBC, this is less likely and alterate causes should be investigated - she will follow up with Dr. Deno Etienne.    This Clinical research associate has deemed the above diagnoses to have a risk of complication, morbidity or mortality of:   []  Minimal  []  Low  []  Moderate   []  due to prescription drug management   [] Decision re: minor surgery   [] Diagnosis or treatment limited by social determinants of health  [x]  Severe    [x]  due to intensive monitoring for chemotherapy toxicity   []  Decision elective surgery with risk factors  []  Decision regarding need for hospitalization  []  Advanced Care Planning decisions    Return to clinic:  1 week.    Danie Diehl K. Celena Lanius, DO  05/28/2019  8:38 AM

## 2019-05-28 NOTE — Telephone Encounter
Call Back Request    MD:  Lum Babe    Reason for call back: Pt.'s daughter, Joelene Millin, is calling stating that Dr. Hewitt Shorts had ordered and authorized for pt. To receive her Reticrit injection.  She is calling to schedule an appt for the pt. Please assist.    Thanks,      Any Symptoms:  []  Yes  [x]  No      ? If yes, what symptoms are you experiencing:    o Duration of symptoms (how long):    o Have you taken medication for symptoms (OTC or Rx):      Patient or caller has been notified of the 24-48 hour turnaround time.

## 2019-05-28 NOTE — Patient Instructions
Restart hydrea 500mg  twice a day

## 2019-05-29 LAB — Uric Acid: URIC ACID: 7.2 mg/dL — ABNORMAL HIGH (ref 2.9–7.0)

## 2019-05-29 LAB — Extra Light Green Top

## 2019-05-29 LAB — Basic Metabolic Panel
POTASSIUM: 4.5 mmol/L (ref 3.6–5.3)
UREA NITROGEN: 53 mg/dL — ABNORMAL HIGH (ref 7–22)

## 2019-05-31 ENCOUNTER — Telehealth: Payer: PRIVATE HEALTH INSURANCE

## 2019-05-31 NOTE — Telephone Encounter
msg Fwd to MD

## 2019-05-31 NOTE — Telephone Encounter
fyi  Pt is transferring care to Dr. Lum Babe.

## 2019-05-31 NOTE — Telephone Encounter
Message to Practice/Provider    MD: Dr Odelia Gage    Message:   Pt needs a shot but no order in the system. Pt daughter will like Dr Hewitt Shorts to place order for pt to get shot.   Return call is not being requested by the patient or caller.    Patient or caller has been notified of the 24-48 hour processing turnaround time if applicable.

## 2019-06-01 ENCOUNTER — Ambulatory Visit: Payer: MEDICARE

## 2019-06-01 ENCOUNTER — Ambulatory Visit: Payer: PRIVATE HEALTH INSURANCE

## 2019-06-01 DIAGNOSIS — N200 Calculus of kidney: Secondary | ICD-10-CM

## 2019-06-01 DIAGNOSIS — E876 Hypokalemia: Secondary | ICD-10-CM

## 2019-06-01 DIAGNOSIS — N189 Chronic kidney disease, unspecified: Secondary | ICD-10-CM

## 2019-06-01 DIAGNOSIS — I10 Essential (primary) hypertension: Secondary | ICD-10-CM

## 2019-06-01 DIAGNOSIS — Z862 Personal history of diseases of the blood and blood-forming organs and certain disorders involving the immune mechanism: Secondary | ICD-10-CM

## 2019-06-01 DIAGNOSIS — N184 Chronic kidney disease, stage 4 (severe): Secondary | ICD-10-CM

## 2019-06-01 DIAGNOSIS — C9310 Chronic myelomonocytic leukemia not having achieved remission: Secondary | ICD-10-CM

## 2019-06-01 MED ORDER — METOPROLOL SUCCINATE ER 50 MG PO TB24
50 mg | ORAL_TABLET | Freq: Every day | ORAL | 1 refills | Status: AC
Start: 2019-06-01 — End: ?

## 2019-06-01 MED ADMIN — CLONIDINE HCL 0.1 MG PO TABS: .1 mg | ORAL | @ 18:00:00 | Stop: 2019-06-01

## 2019-06-01 NOTE — Progress Notes
Outpatient Nephrology Consultation    Date of Service: June 01, 2019     Reason for Consultation: Elevated creatinine-CKD- HTN  Chief Complaint   Patient presents with   ??? New Consult     CKD       History of Present Illness:    This is a pleasant 85 y.o. female patient with past medical history of chronic kidney disease, hypertension, MDS, anemia, dyslipidemia is here to transfer care.  Patient is from [place] has nephrologist and primary care doctor there now visiting her daughter here which she is planning to go back in few weeks to [place].  Was seen by different nephrologist at [facility] also.  Has history of MDS, was receiving Aranesp, has missed a dose recently when moved to [facility].  Hemoglobin dropped required transfusions.  Today here is enquiring about ESA on the 1st visit.  Unfortunately blood pressure was very elevated so unable to give ESA 8 today.  Patient to come back next week if blood pressure controlled, etiology of underlying chronic kidney disease is unknown but patient been diagnosed for almost 5 years and may likely be contributed to high blood pressure.  Is accompanied with daughter in the clinic today.    ?  ROS:  Pertinent ROS  Performed , negative except if mentioned in HPI above ,  ?  Past Medical and Surgical History:  Patient Active Problem List   Diagnosis   ??? Anemia in chronic kidney disease   ??? Chronic kidney disease due to hypertension   ??? Proteinuria   ??? Myelodysplasia (myelodysplastic syndrome) (HCC/RAF)   ??? Leukemia (HCC/RAF)   ??? Hypothyroidism   ??? Hypertension   ??? Hyperlipidemia   ??? History of ductal carcinoma in situ (DCIS) of breast   ??? History of CVA (cerebrovascular accident)   ??? Hyperleukocytosis   ??? CKD (chronic kidney disease)   ??? 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 GERD (gastroesophageal reflux disease)     Past Medical History:   Diagnosis Date   ??? CKD (chronic kidney disease)    ??? 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 GERD (gastroesophageal reflux disease)    ??? History of CVA (cerebrovascular accident) 2011   ??? History of ductal carcinoma in situ (DCIS) of breast     s/p b/l mastectomy   ??? Hyperlipidemia    ??? Hypertension    ??? Hypothyroidism    ??? Leukemia (HCC/RAF)    ??? Myelodysplasia (myelodysplastic syndrome) (HCC/RAF)      Past Surgical History:   Procedure Laterality Date   ??? CESAREAN SECTION     ??? MASTECTOMY Bilateral    ??? TOTAL ABDOMINAL HYSTERECTOMY         Family History:  No pertinent known family history of Renal disease except if mentioned in HPI .  Family History   Problem Relation Age of Onset   ??? Stroke Mother    ??? Emphysema Father    ??? Ovarian cancer Sister    ??? Heart attack Brother    ??? Lung cancer Sister    ??? Lung cancer Sister    ??? Colon cancer Brother    ??? Colon cancer Son        Social History:  Denies recent alcohol or drug abuse .    Social History     Socioeconomic History   ??? Marital status: Widowed     Spouse name: Not on file   ???  Number of children: Not on file   ??? Years of education: Not on file   ??? Highest education level: Not on file   Occupational History   ??? Not on file   Social Needs   ??? Financial resource strain: Not on file   ??? Food insecurity     Worry: Not on file     Inability: Not on file   ??? Transportation needs     Medical: Not on file     Non-medical: Not on file   Tobacco Use   ??? Smoking status: Never Smoker   ??? Smokeless tobacco: Never Used   Substance and Sexual Activity   ??? Alcohol use: Not Currently   ??? Drug use: Never   ??? Sexual activity: Not on file   Lifestyle   ??? Physical activity     Days per week: Not on file     Minutes per session: Not on file   ??? Stress: Not on file   Relationships   ??? Social Wellsite geologist on phone: Not on file     Gets together: Not on file     Attends religious service: Not on file     Active member of club or organization: Not on file     Attends meetings of clubs or organizations: Not on file     Relationship status: Not on file Other Topics Concern   ??? Need Help Feeding Yourself? Not Asked   ??? Need Help Getting Dressed? Not Asked   ??? Need Help Using the Telephone? Not Asked   ??? Need Help Managing Money? Tree surgeon, [employer]) Not Asked   ??? Need Help Shopping for Groceries? Not Asked   ??? Need Help Getting Places Beyond Walking Distance? [employer], [employer]) Not Asked   ??? Need Help Getting from Bed to Chair? Not Asked   ??? Need Help Bathing or Showering? Not Asked   ??? Need Help Taking your Medications? Not Asked   ??? Need Help Doing Moderately Strenuous Housework? (ex. Laundry) Not Asked   ??? Need Help Driving? Not Asked   ??? Need Help Getting to the Toilet? Not Asked   ??? Need Help Walking Across the Road? (Includes Ephraim Hamburger) Not Asked   ??? Need Help Preparing Meals? Not Asked   ??? Need Help Shopping for Personal Items? (Toiletries, Medicines) Not Asked   ??? Need Help Climbing a Flight of Stairs? Not Asked   ??? Do you live with someone who assists you at home? Not Asked   ??? Do you get help from family members or friends in your home? Not Asked   ??? Do you employ someone to provide health related care or help you in your home? Not Asked   ??? Do you provide care for a family member? Not Asked   ??? Does your home have rugs in the hallway? Not Asked   ??? Does your home have poor lighting? Not Asked   ??? Does your home lack grab bars in the bathroom? Not Asked   ??? Does your home lack handrails on the stairs? Not Asked   ??? Have you noticed any hearing difficulties? Not Asked   ??? Do you currently participate in any regular activity to improve or maintain your physical fitness? Not Asked   ??? Do you always wear a seatbelt when you ride in a car? Not Asked   ??? If you drink alcohol, do you drink more than 7 drinks per  week or more than 3 drinks on any given day? Not Asked   ??? Has anyone ever been concerned about your drinking? Not Asked   ??? Do you exercise at least a day, 3 or more days a week? No   ??? Types of Exercise? (List in Comments) No   ??? Do you follow a special diet? No   ??? Vegan? No   ??? Vegetarian? No   ??? Pescatarian? No   ??? Lactose Free? No   ??? Gluten Free? No   ??? Omnivore? No   Social History Narrative   ??? Not on file       Medications:    Outpatient Encounter Medications as of 06/01/2019   Medication Sig Dispense Refill   ??? allopurinol 100 mg tablet Take 1/2 tablets (50 mg total) by mouth daily. 90 tablet 0   ??? aspirin 81 mg chewable tablet Chew 1 tablet (81 mg total) by mouth daily. 90 tablet 0   ??? diclofenac Sodium 1% gel Apply 2 g topically every six (6) hours as needed (for L shoulder pain). 350 g 0   ??? dilTIAZem (CARDIZEM CD) 120 mg 24 hr capsule Take 1 capsule (120 mg total) by mouth daily. 90 capsule 0   ??? epoetin alfa-epbx (RETACRIT) 10,000 units/mL injection Inject 0.73 mLs (7,300 Units total) under the skin three (3) times a week. 20 mL 0   ??? esomeprazole 40 mg capsule Take 40 mg by mouth daily .     ??? gabapentin 100 mg capsule Take 2 capsules (200 mg total) by mouth at bedtime. 90 capsule 0   ??? guaiFENesin 100 mg/5 mL syrup Take 5 mLs (100 mg total) by mouth every six (6) hours as needed. 180 mL 0   ??? hydroxyurea 500 mg capsule Take 2 capsules (1,000 mg total) by mouth every morning and take 1 capsule (500 mg total) by mouth every evening. 90 capsule 0   ??? loratadine 10 mg tablet Take ONE-HALF tablets (5 mg total) by mouth daily. 90 tablet 0   ??? losartan 50 mg tablet Take 1 tablet (50 mg total) by mouth daily. 90 tablet 0   ??? magnesium oxide 400 mg tablet Take 400 mg by mouth daily Hold for loose stools.Marland Kitchen     ??? rosuvastatin 20 mg tablet Take 20 mg by mouth at bedtime .     ??? SYNTHROID 50 mcg tablet Take 50 mcg by mouth every other day Pt alternates with 75mg .     ??? SYNTHROID 75 mcg tablet Take 75 mcg by mouth every other day Pt alternates with 50mg .     ??? hydroxyurea 500 mg capsule Take 1 capsule (500 mg total) by mouth every evening. (Patient not taking: Reported on 06/01/2019.) 90 capsule 0   ??? metoprolol succinate 50 mg 24 hr tablet Take 1 tablet (50 mg total) by mouth daily. 90 tablet 1   ??? polyethylene glycol powder packet Take 1 packet (17 g total) by mouth daily. (Patient not taking: Reported on 06/01/2019.)       Facility-Administered Encounter Medications as of 06/01/2019   Medication Dose Route Frequency Provider Last Rate Last Admin   ??? [COMPLETED] cloNIDine tab 0.1 mg  0.1 mg Oral Once Gordy Levan, MD   0.1 mg at 06/01/19 1118   ??? epoetin alfa-epbx (Retacrit) 10,000 units/mL inj 10,000 Units  10,000 Units Subcutaneous Q7 Days Boonpheng, Boonphiphop, MD           Allergies:  Allergies   Allergen Reactions   ??? Coffee      Reported to RD   ??? Oxycodone-Acetaminophen Other (See Comments)   ??? Penicillins Hives   ??? Sulfa Antibiotics Agranulocytosis   ??? Naproxen Rash     Aleve         Physical Exam:    Vitals: BP (!) 181/67 Comment: Md Notified ~ Pulse 84  ~ Temp 36.2 ???C (97.1 ???F) (Forehead)  ~ Resp 18  ~ Ht 5' 1'' (1.549 m)  ~ Wt 104 lb 12.8 oz (47.5 kg)  ~ SpO2 97%  ~ BMI 19.80 kg/m???     Weight: Weight: 104 lb 12.8 oz (47.5 kg)      Results:    I reviewed recent  renal pertinent laboratory and/or imaging results including but not limited to the following and discussed the pertinent results with pt/family/caregiver  .        Lab Results   Component Value Date    NA 140 05/28/2019    K 4.5 05/28/2019    CL 103 05/28/2019    CO2 25 05/28/2019    BUN 53 (H) 05/28/2019    CREAT 1.83 (H) 05/28/2019    GLUCOSE 93 05/28/2019       Lab Results   Component Value Date    CALCIUM 9.9 05/28/2019    ICALCOR 1.18 05/24/2019    PHOS 3.2 05/24/2019    MG 1.3 (L) 05/11/2019    ALBUMIN 4.0 05/14/2019       Lab Results   Component Value Date    WBC 10.8 (H) 05/28/2019    HGB 9.9 (L) 05/28/2019    HCT 32.3 (L) 05/28/2019    MCV 91.5 05/28/2019    PLT 226 05/28/2019       Lab Results   Component Value Date    SPECGRAVUR 1.018 05/14/2019    PHUR 6.0 05/14/2019    BLDUR 1+ (A) 05/14/2019    BILIUR Negative 05/14/2019    KETONESUR Negative 05/14/2019    GLUCOSEUR Negative 05/14/2019    PROTCLUR 2+ (A) 05/14/2019    NITRITEUR Negative 05/14/2019    LEUKESTUR Negative 05/14/2019    RBCSUR 19 (H) 05/14/2019    WBCSUR 15 05/14/2019       Lab Results   Component Value Date    NARANUR 92 05/10/2019    UNRANUR 457 04/19/2019    KRANUR 49 04/30/2019    CREATRANUR 30.4 05/10/2019    CREATRANUR 30.4 05/10/2019         Recent Results (from the past 16109 hours)   US kidney non-vascular bilat (bladder images included) [IMG1080]    Status: Normal 05/11/2019  2:32 PM    Impression IMPRESSION:    Mild right hydronephrosis, unchanged since prior.  Left pelviectasis.          Signed by: Lane Hacker   05/11/2019 2:32 PM       Recent Results (from the past 60454 hours)   [place] abd complete [IMG524]    Status: Normal 05/11/2019  8:50 AM    Impression IMPRESSION:    1. Marked splenomegaly.  Mild hepatomegaly.    2. Borderline right hydronephrosis.    I, Lane Hacker, M.D., have reviewed this radiological study personally and I am in full agreement with the findings of the report presented here.    Signed by: Lane Hacker   05/11/2019 8:50 AM     ??  Assessment and Plan:    Lindsay Brennan is a 85 y.o.female  with CKD, HTN, MDS and hypothyroidism. :    1. CKD (chronic kidney disease) stage 4, GFR 15-29 ml/min (HCC/RAF)  Now Cr 1.6-1.8 range   Patient is living in [place] and has nephrologist there, is moving back soon in few weeks,  Been diagnosed with chronic kidney disease 4 for 5 years now, underlying etiology is unknown,Likely hypertension as blood pressure been uncontrolled    -advised to avoid nephrotoxic medications like NSAIDs (Ibuprofen, Motrin, Aleve, Naproxen,.Marland Kitchen). Tylenol in moderation ok to take for pain from kidney standpoint.  -when estimated GFR <30, avoid gadolinium contrast exposure with MRI and also avoid metformin .  -use caution with medications with ingredients which could accumulate in CKD including aluminum (sucralfate), magnesium (maalox, milk of mag), phosphorous (fleets).  -Avoid iodinated contrast for CT Scans, as possible.    -recommended to follow these dietary recommendations in patient with chronic kidney disease GFR below 60:  -Low Protein Diet 0.6-0.8 g/kg/day.  -low  Sodium intake  2-3 grams/day  .  -Low Potassium intake if potassium above 4.7 , which means avoiding (avocado, potato(chips, fries, mash potato), tomato(ketchup, juice), banana, [place] juice, tropical fruits, cactus,???).  -Phosphorus intake limited at 800 mg/day unless your phosphorus is below 3.5.     2. History of anemia due to CKD/ hx of MDS   Unable to give ESA today as BP very high to return next week  Plan was ESA , per notes Hem approved     3. Nephrolithiasis  No current symptoms     4. Hypokalemia  ? If taking po KCL ?  Likely to rule out hyperaldosteronism due to hypertension and hypokalemia    5. Essential hypertension  Uncontrolled  Low salt diet advised  currently on dilt 120 , losartan 50 ,   Restart MTP 50 mg daily monitor the heart rate  Used to be on clonidine   - cloNIDine tab 0.1 mg given in clinic today as BP very high         RTC in 1 wk  with lab studies prior.     More than  40 minutes was spent on the day of the visit for this visit, including time spent on reviewing  the labs, charts ,available renal related  imagings ,counseling, discussing previous available renal related lab results, treatment plans, ordering tests/medicines if needed, answering the patient's questions  ,coordinating care and documentation .    Chelse Matas Jamey Reas M.D.  Division of Nephrology-[place]  June 01, 2019  ?  Parts of this note was transcribed via Fluency Direct dictation system. Please excuse any Grammatical or typographycal errors. if any confusion can contact me directly.

## 2019-06-03 ENCOUNTER — Ambulatory Visit: Payer: MEDICARE

## 2019-06-03 ENCOUNTER — Telehealth: Payer: MEDICARE

## 2019-06-03 NOTE — Progress Notes
[facility] Pharmacist Medication Therapy Management Progress Note  Patient: Lindsay Brennan MRN: [id number]  DOB: 11/11/1934    Clinic:   Date of Service: 06/03/2019  Primary Care Provider:  Salley Slaughter, MD    [x]  New Visit  []  Follow-Up (Initial Visit:               )      []  Tele-Health Visit  Tele-Health Clinic Location:  [facility] FAMILY MEDICINE, INTERNAL MEDICINE, PEDIATRICS    Reason for Visit: Medication therapy management/Medication review    [facility] is a consultation service, all plans and recommendations made by the clinical pharmacist(s) will be reviewed and accepted at the discretion of the primary care physician and/or referring provider.      SUBJECTIVE:     Lindsay Brennan is a 85 y.o. female who is referred for polypharmacy. Patient's daughter Lindsay Brennan is present during video visit. Pt is hoping to return to [place] where she usually receives care, but needs clearance for travel from nephrology.     Patient notes to be lightheaded when up and around, but not at rest. However, during nephrology visit BP was elevated to 180's and pt was given clonidine 0.1 mg and prescribed metoprolol. Noted day after BP was bottoming out and took half-tab of metoprolol (=25mg ).    Interval Medication Changes: Will start taking BP before medications are given. Today, BP 145/76 HR 96 after morning medications and metoprolol was taken afterwards.     Medication Schedule (as reported during visit)   Morning   Sun-Tues-Thurs-Sat 75 mcg and Mon-Wed-Fri 50 mcg- now taking 1 hour before breakfast (used to take with Nexium and ASA 81 mg, then started to separate by 1 hour per recommendation of provider)    30 minutes before breakfast:  Losartan 50 mg   Diltiazem 120 mg      With breakfast:   Ferric citrate 210 mg TID with meals and sodium bicarb 650 mg, allopurinol half-tab = 50 mg, baby aspirin    Noon   Ferric citrate  Half-tab loratidine (=5mg )  Metoprolol 50 mg (Started Tuesday 4/27 with nephrology visit) - caused orthostatic hypotension and pt concerned with BP bottoming out     Evening   Nexium 40 mg   Ferric citrate, sodium bicarb   Bedtime   Crestor 20 mg  Gabapentin 200 mg -- pt cut to 100 mg nightly given dizziness  Hydroxyurea  --> will plan to start metoprolol at bedtime    As needed medication(s)   Tylenol     Supplement(s)        Refills Needed: n/a    Diet Recall - low sodium diet per Lindsay Brennan  Breakfast -  Egg whites (80 mg sodium) with english muffin (70 mg of sodium) , half-piece of cheese (70 mg sodium) with 1/8 tsp (210 mg sodium)  Lunch - chicken salad sandwich w/ grape onions, miracle whip, cesar salad ([employer])  Dinner - Grilled salmon, [place] burgers  Snacks -  Slice of cheese (180 mg sodium), apples, crackers (60 mg sodium per 5 cracker)  Beverages - apple juice, grape juice, water      All Medications   No outpatient medications have been marked as taking for the 06/03/19 encounter (Telemedicine) with Richardo Hanks, PharmD.     Current Facility-Administered Medications for the 06/03/19 encounter (Telemedicine) with Richardo Hanks, PharmD   Medication   ??? epoetin alfa-epbx (Retacrit) 10,000 units/mL inj 10,000 Units  OBJECTIVE:     There were no vitals taken for this visit.     BP Readings from Last 3 Encounters:   06/01/19 (!) 181/67   05/28/19 (!) 190/68   05/24/19 154/60        Estimated body mass index is 19.8 kg/m??? as calculated from the following:    Height as of 06/01/19: 5' 1'' (1.549 m).    Weight as of 06/01/19: 104 lb 12.8 oz (47.5 kg).    Serum creatinine: 1.83 mg/dL (H) 60/45/40 9811  Estimated creatinine clearance: 17.5 mL/min (A)    Lab Results   Component Value Date    ALBCREATUR 377.3 (H) 05/10/2019       Lab Results   Component Value Date    CREAT 1.83 (H) 05/28/2019    CREAT 1.68 (H) 05/24/2019    CREAT 1.78 (H) 05/20/2019    GFRESTNOAA 25 05/28/2019    GFRESTNOAA 28 05/24/2019    GFRESTNOAA 26 05/20/2019    BUN 53 (H) 05/28/2019    K 4.5 05/28/2019    K 4.1 05/24/2019       Lab Results   Component Value Date    CREAT 1.83 (H) 05/28/2019    CREAT 1.68 (H) 05/24/2019    CREAT 1.78 (H) 05/20/2019    GFRESTAA 29 05/28/2019    GFRESTAA 32 05/24/2019    GFRESTAA 30 05/20/2019    BUN 53 (H) 05/28/2019    K 4.5 05/28/2019    K 4.1 05/24/2019       2018 ACC/AHA guidelines recommends high-intensity statin because of ASCVD diagnosis. Patient is receiving guideline-directed statin therapy; monitor adherence.  BP Recommendation       BP-lowering medication  in conjunction with lifestyle modification with goal SBP < 130 (Class I) with elevated readings for 2 or more measurements      10-year ASCVD risk calculator does not apply because patients with ASCVD are recommended to take a high-intensity statin  as of 11:20 AM on 06/03/2019  10-year ASCVD risk with optimal risk factors cannot be calculated.  Values used to calculate ASCVD score:  Age: 85 y.o. Cannot calculate risk because age is not between 56 and 11 years old.  Gender: Female Race: African American.  HDL cholesterol: 19 mg/dL. Cannot calculate risk because HDL <20 mg/dL. HDL cholesterol measured 136 days ago.  Total cholesterol: 77 mg/dL. Cannot calculate risk because Total cholesterol <130 mg/dL. Total cholesterol measured 136 days ago.  LDL cholesterol not documented within the past 5 years.    Systolic BP: 181 mm Hg. BP was measured 2 days ago.  The patient is being treated with a medication that influences SBP.  The patient is currently not a smoker.  The patient does not have a diagnosis of diabetes.    Click here for the [facility] ASCVD Cardiovascular Risk Estimator Plus tool Office manager).  Click here for the GI Bleeding risk Office manager).    Lab Results   Component Value Date    BNP 91 05/20/2019    BNP 532 (H) 05/09/2019       Lab Results   Component Value Date    PTHINT 86 (H) 04/30/2019    PTHINT 68 (H) 04/12/2019    CALCIUM 9.9 05/28/2019    CALCIUM 9.4 05/24/2019    VITD25OH 51.1 01/18/2019    MG 1.3 (L) 05/11/2019    PHOS 3.2 05/24/2019    HGB 9.9 (L) 05/28/2019    HCT 32.3 (L) 05/28/2019  ASSESSMENT:    Medication Reconciliation Performed: yes    **MEDICATION-RELATED ISSUE AND ADHERENCE:   -Updated medication list in [facility].   -Provided updated medication list and oriented pt to list. Answered all of pt's questions.   - Medication adjustments in CKD (eGFR 30 ml/min/m2):   - Hydroxyurea: 50% dose reduction = 500 mg BID   - Allopurinol: 50 mg daily to every other day   - Loratadine: take half-tab     **Medication Reconciliation/DDI:   - Ferric citrate and sodium bicarbonate - reduced absorption of iron with concomitant administration however iron level at goal  - Levothyroxine - Dr. Deno Etienne instructed pt to take on empty stomach away from other medications.   - No additional significant DDI identified    **Dizziness:  -Advised hold parameters for low BP; appropriate to hold metoprolol if SBP < 115 given h/o dizziness and need to reduce risk of falls  -Pt decreased gabapentin 200 mg daily to 100 mg daily to monitor for improvement in dizziness, neuropathy symptoms stable    **CKD/HTN: { :23997} per home & Clinic readings [goal <130/80 per AHA/ACC 2017 guideline].  -Pt is on losartan 50 mg daily, diltiazem 120 mg daily, and newly started on metoprolol ER 50 mg daily  -On rosuvastatin, on aspirin 81 mg  - Cont per nephrology    PLAN AND RECOMMENDATIONS:     **Med Rec/Med Education  - Patient to continue medication regimen as listed and continue close f/u with specialists for dosage adjustments      PATIENT MEDICATION LIST   Name: Whitley Patchen Pharmacy:      Date:  06/03/2019            Medication Brand Name Strength Directions Used For AM PM Bedtime Comments    1 Levothyroxine Synthroid 75 mcg, 50 mcg   Take 1 tablet  daily as directed  Thyroid level  X     Empty stomach with water only ??? separate from other meds   2 Allopurinol Zyloprim 100 mg Take one-half (=50 mg)  tablet daily  Uric acid level X      3 Aspirin  81 mg Take 1 tablet daily Heart health X       4 Losartan Cozaar 50 mg Take 1 tablet daily Blood pressure and kidney health X        5 Diltiazem Cardizem 120 mg Take 1 tablet daily Blood pressure X         6 Ferric citrate Iron 210 mg Take 1 tablet 3 times a day with meal Iron level X  X       7 Sodium bicarbonate  650 mg  Take 1 tablet 2 times a day Bicarbonate level X X       8 Loratadine Claritin 10 mg Take 5 mg ( = half-tablet) daily Allergies X        9 Esomeprazole Nexium 40 mg Take 1 tablet daily Stomach acid/heartburn X  X  Best taken ~1 hour before meal   10 Rosuvastatin Crestor 20 mg Take 1 tablet daily Cholesterol level   X    11 Gabapentin Neurontin 100 mg  Take 1 capsule at bedtime  Neuropathy   X Patient decreased from 200 mg nightly to 100 mg night due to dizziness   12 Hydroxyurea Hydrea 500 mg Take 1 capsule daily CML   X Dose per Dr. Lennon Alstrom on 05/28/19 (may increase based on labs)   13 Metoprolol Toprol XL 50 mg Take  1 capsule daily Blood pressure and heart rate   X May HOLD dose only if symptoms of dizziness and systolic blood pressure is <115  *NEW from 06/01/19*       Patient Consent to Telehealth Questionnaire   [facility] TELEHEALTH PRECHECKIN QUESTIONS 06/03/2019   By clicking ''I Agree'', I consent to the below:  I Agree     - I agree  to be treated via a video visit and acknowledge that I may be liable for any relevant copays or coinsurance depending on my insurance plan.  - I understand that this video visit is offered for my convenience and I am able to cancel and reschedule for an in-person appointment if I desire.  - I also acknowledge that sensitive medical information may be discussed during this video visit appointment and that it is my responsibility to locate myself in a location that ensures privacy to my own level of comfort.  - I also acknowledge that I should not be participating in a video visit in a way that could cause danger to myself or to those around me (such as driving or walking).  If my provider is concerned about my safety, I understand that they have the right to terminate the visit.           Author:  Richardo Hanks, PharmD 06/03/2019 11:20 AM

## 2019-06-04 ENCOUNTER — Ambulatory Visit: Payer: PRIVATE HEALTH INSURANCE

## 2019-06-04 LAB — Uric Acid: URIC ACID: 7.5 mg/dL — ABNORMAL HIGH (ref 2.9–7.0)

## 2019-06-04 LAB — Basic Metabolic Panel
SODIUM: 137 mmol/L (ref 135–146)
TOTAL CO2: 27 mmol/L (ref 20–30)

## 2019-06-06 NOTE — Progress Notes
Outpatient Hematology/Oncology Progess Note    Patient name:  Lindsay Brennan  MRN:  [id number]  DOB:  04/09/1934  CSN: [id number]  Date of encounter: 06/04/19  Provider: Caroline Sauger K. Nareg Breighner, DO    Reason for appointment: hospital follow up  Underlying disease: hyperleukocytosis    HPI:   Lindsay Brennan is a 85 y.o. 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 Per the patient, lightheadedness has time correlation with diltiazem, improving with decrease in the dosage, and only occurring when she rises from seated position. She was planning to go back home to [place] but mentioned dizziness to her family who reportedly found her O2 saturation to be 80's at home, prompting admission. She says she only has dyspnea when lying flat, but improves in supine. She also endorses cough for the past 2 days. COVID negative.   ???  [facility], evaluated by oncology service, started on IVF and allopurinol for uric acid 11.8. other notable labs include LDH 1426, d-dimer 2.4, fibrinogen 271, PT 20.4  ???  CXR demonstrated bilateral lower lobe consolidations  abd [place] with marked splenomegaly  ???  05/11/19 transferred to [facility] to geriatrics service.   Patient reports weight loss, occasional night sweats, early satiety. Continues to have dizziness and SOB with exertion.   ???  05/12/19 started hydrea 500mg  BID yesterday. Breathing and dizziness is stable  05/13/19 no overnight events WBC down to 115  05/14/19 dizziness has resolved, WBC stable. Still with DOE. No fevers, dry cough which is unchanged. Increased hydrea to 1000mg  BID  05/15/19:  Denies dizziness.  Denies sob.  Still with some doe.  Denies fevers.  WBC 91, Hgb 7.7. started retacrit 7,300units T/Th/Sat  05/16/19:  No complaints.  WBC 65, Hgb 7.8, uric acid 9.2, Cr 1.74  05/17/19: SOB and dizziness are mild/stable. WBC 30, hgb 7.4, platelets 132, uric acid 10.  05/20/19: ED for [facility] transfusion for  hgb 6.8. hydrea held  05/28/19 restarted hydrea 500mg  BID    Subjective:  Here for follow up. Dizziness better. BP still elevated.     Objective:  Current medications:  Outpatient Medications Prior to Visit   Medication Sig   ??? allopurinol 100 mg tablet Take 1/2 tablets (50 mg total) by mouth daily.   ??? aspirin 81 mg chewable tablet Chew 1 tablet (81 mg total) by mouth daily.   ??? diclofenac Sodium 1% gel Apply 2 g topically every six (6) hours as needed (for L shoulder pain).   ??? dilTIAZem (CARDIZEM CD) 120 mg 24 hr capsule Take 1 capsule (120 mg total) by mouth daily.   ??? epoetin alfa-epbx (RETACRIT) 10,000 units/mL injection Inject 0.73 mLs (7,300 Units total) under the skin three (3) times a week.   ??? esomeprazole 40 mg capsule Take 40 mg by mouth daily .   ??? gabapentin 100 mg capsule Take 2 capsules (200 mg total) by mouth at bedtime.   ??? guaiFENesin 100 mg/5 mL syrup Take 5 mLs (100 mg total) by mouth every six (6) hours as needed.   ??? hydroxyurea 500 mg capsule Take 2 capsules (1,000 mg total) by mouth every morning and take 1 capsule (500 mg total) by mouth every evening.   ??? hydroxyurea 500 mg capsule Take 1 capsule (500 mg total) by mouth every evening. (Patient not taking: Reported on 06/01/2019.)   ???  loratadine 10 mg tablet Take ONE-HALF tablets (5 mg total) by mouth daily.   ??? losartan 50 mg tablet Take 1 tablet (50 mg total) by mouth daily.   ??? magnesium oxide 400 mg tablet Take 400 mg by mouth daily Hold for loose stools.Marland Kitchen   ??? metoprolol succinate 50 mg 24 hr tablet Take 1 tablet (50 mg total) by mouth daily.   ??? polyethylene glycol powder packet Take 1 packet (17 g total) by mouth daily. (Patient not taking: Reported on 06/01/2019.)   ??? rosuvastatin 20 mg tablet Take 20 mg by mouth at bedtime .   ??? SYNTHROID 50 mcg tablet Take 50 mcg by mouth every other day Pt alternates with 75mg .   ??? SYNTHROID 75 mcg tablet Take 75 mcg by mouth every other day Pt alternates with 50mg .     Facility-Administered Medications Prior to Visit   Medication   ??? epoetin alfa-epbx (Retacrit) 10,000 units/mL inj 10,000 Units       PMHx:   Past Medical History:   Diagnosis Date   ??? CKD (chronic kidney disease)    ??? 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 GERD (gastroesophageal reflux disease)    ??? History of CVA (cerebrovascular accident) 2011   ??? History of ductal carcinoma in situ (DCIS) of breast     s/p b/l mastectomy   ??? Hyperlipidemia    ??? Hypertension    ??? Hypothyroidism    ??? Leukemia (HCC/RAF)    ??? Myelodysplasia (myelodysplastic syndrome) (HCC/RAF)        PSHX:  Past Surgical History:   Procedure Laterality Date   ??? CESAREAN SECTION     ??? MASTECTOMY Bilateral    ??? TOTAL ABDOMINAL HYSTERECTOMY         Allergies:  Allergies   Allergen Reactions   ??? Coffee      Reported to RD   ??? Oxycodone-Acetaminophen Other (See Comments)   ??? Penicillins Hives   ??? Sulfa Antibiotics Agranulocytosis   ??? Naproxen Rash     Aleve       Review of Systems:  A complete 14 point review of systems has been is negative except for as documented.    Vitals:  BP 170/65  ~ Pulse 85  ~ Temp (!) 35.9 ???C (96.6 ???F) (Forehead)  ~ Resp 18  ~ Ht 154.9 cm  ~ Wt 47.5 kg (104 lb 12.8 oz)  ~ SpO2 96%  ~ BMI 19.80 kg/m???   Reviewed and documented in clinic chart.    Physical Exam:  General: Appears well-developed, well-nourished and close to stated age.   Head: Normocephalic, atraumatic.  CV: Regular in rate and rhythm, no murmurs or gallops.   Chest: Clear to auscultation bilaterally without wheezing or rhonchi. No crackles noted. Respiratory effort appears normal.   Extremities: No edema. No cyanosis. Extremities are warm and well-perfused.     Review of Data:  I have  [x]  Reviewed/ordered []  1 []  2 [x]  ? 3 unique laboratory, radiology, and/or diagnostic tests noted below   [x]  Reviewed []  1 []  2 []  ? 3 prior external notes and incorporated into patient assessment   []  Discussed management or test interpretation with external provider(s) as noted     Recent Labs:  Lab Results   Component Value Date    WBC 10.2 (H) 06/04/2019    HGB 9.4 (L) 06/04/2019    HCT 30.4 (L) 06/04/2019    MCV 91.6 06/04/2019  PLT 216 06/04/2019     Lab Results   Component Value Date    ALT 16 05/14/2019    AST 37 05/14/2019    ALKPHOS 202 (H) 05/14/2019    BILITOT 0.4 05/14/2019     Lab Results   Component Value Date    CREAT 1.79 (H) 06/04/2019    BUN 48 (H) 06/04/2019    NA 137 06/04/2019    K 4.1 06/04/2019    CL 97 06/04/2019    CO2 27 06/04/2019     Uric Acid   Date Value Ref Range Status   06/04/2019 7.5 (H) 2.9 - 7.0 mg/dL Final         Flow cytometry  INTERPRETATION:  Small population of myeloblasts (1% of total events)   ???  COMMENT:  Marked leukocytosis with neutrophilia, monocytosis and rare blasts. Please correlate with FISH studies.  ???  Pertinent imaging:  05/10/19 abd US  IMPRESSION:  1. Marked splenomegaly. ???Mild hepatomegaly.  2. Borderline right hydronephrosis.  ???  05/09/19 CXR  IMPRESSION:  ???  Stable cardiomediastinal silhouette with mildly tortuous, calcified thoracic aorta. Persistent mild cardiomegaly.  Normal lung volumes. Increased density of the bilateral lower lung zones is accentuated due to overlying breast implants.  There are bilateral lower lobe patchy consolidations, most suggestive of pulmonary edema versus pneumonia.  ???No right pleural effusion. Trace left pleural effusion. No pneumothorax. Elevation of the left hemidiaphragm  No acute osseous abnormalities.  ???  ???  Pertinent pathology:  Patlent Name: Lindsay Brennan, Lindsay Brennan Accession#: [id number] Med. Rec. # [id number] Taken: 10/30/2016 Encounter #: [id number] Received: 10/30/2016 Gender: F Reported: 11/04/2016 16:53 DOB: [birth year] (Age: 51)  Submitting Physician: Hubert Azure D M.D.  Additional Physician(s):  Location: [facility]  Clinical Information:  85 year old with remota history of mild pancytopenia in 2008. There ara no recent history or CBC studies.  Specimen Received:  A: Bone marrow,aspirate for clot section, pic,cyto sent to iu B: Bone marrow, biopsy (decalelfied),Ipic  ???  Final  Pathologic Diagnosis:  id B. Bone marrow, bio and aspirate for clot section, Jeft posterioriliac crest: Myeloid neoplasm. See note.  As the senior physician, ~ attest that I: (i) examined the relevant preparation(s) for the specimen(s); and (ii) rendered or confirmed the diagnosis(es).  ???Electronically Signed ngz1/10/30/2016 Jobie Quaker, M.D. (Pathologist)  Deanne Coffer, M.D. 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 Clinical correlation and correlation with cytogenetics/FISH study including BCR-ABL7 fusion study is recommended for further classification.  Dr. Chipper Herb has reviewed the bone marrow aspirate results in [employer], which reparted as ???CMML-1???.  ???  Cellularity: Markedly hypercellular (> 90%) Myslopoiesis: Markedly increased with full maturation Erythropoiesis: Decreased Megakaryopoiesis: Markedly increased with frequent hypolobated/monolobated megakaryocytes including small hyperchromatic forms Additional studles: Reticulin stain shows mild increasein reticulin fibrosis (1+) with appropriate control,  Theimmunohistechemical stains are performedin this tase and same of the antigens may also ba evaluated by flow cytometry, Concument immunchistechemieal stains on tissue sections are Indicated in order to correlate immunophenotype with cell morphology, evaluate spatial dletribution/architectural features and/or for a quantitation of disease involvement  Bone marrow,aspirate smear:  Sample quality: Adequate  ME ratio: Markedly increased, ~ 8:1 Blasts: Not increased (4%) ' Myelopoiesis: Markedly increased with occasional hypolobated neutrophils Erythropoiesis: Decreased with megaloblastoid changes Megakaryopoiesis: Markedly increased with frequent hypolobated/monolobated megakaryocytes Comments: No clusters of blasts, no Auer rods. Additional studies: Iron stain performed at the [facility]  showsincreased storage iron with no ring sideroblasts. Control stains appropriately,  Bone marrow differential:  Percent  Blasts 2  Promyeltocytes 7  Myelocytes 23  Metamyelocytes 6  Bands 16  Polys 30  Lymphocytes 3  Monocytes 2  Eosinophils 4  Basophils 1  Nucleated RBCs 10  Peripheral blood, smear:  CBC: WBC 46 k/cumm, hemoglobIn 10,7 g/dL, MCV 81 fL, ROW 16.1%, platelet count 316 k/cumm Differential: Neutrophils 57%, lymphocytes 5%, monocytes 21%, myelocytas 3%, Metamyelocytes 8%, band 6%  RBCs: Normocytic anaemia with anlsopgikilocytosis. WEBCs: Leucocytosis, granulocytosis with left-shift, monocytosis. Platelets: Normal platelet caunt with occasional giantplatelets  ???  Results-Comments  CD34 and p53 stains show rare CD34+ blasts (<5%) and exceedingly rare p53+ cells. Controls stain appropriately.  ???  INTERERETATION: (600 cell diff)  ???  Bone Marrow Aspirate specimen is adequate and is markedly hypercellular. Spicules are present.  Megakaryocytes Are increaged in numberwith occasional small mono/hypolobated forms and rare small separated lobe forms identified. My elopoiesis Is abnormal, increased, and proceeds to completion. Blasts and promonocytes comprise 6% of total nucleated cells. : Blast clusters are noted near the particle areas, Erythropoiesis ???_Is decreased (erythroid hypoplasia). The M/E ratio is 8.871.  Erythroid series Shows occasional megaloblastoid changes.  Lymphoid Lymphocytes comprise 2% of total nucleated cells,  Comments: Correlate with bone marrow biopsy and cytogenetic studies,  Tron Stain Results: +2/6 Iron in reticulum, no ringed formsidentified.  Control: positive.  IMPRESSION: DATESIGNED OUT: 11/01/2016 TIME: 10:10 CMML-1.  COMMENT: The marrow biopsy was not available at the time of finalizing the aspirate report. In the event of a marrow biopsy report with discrepant results an addendum will be provided by [facility].  ???  My blast count was 8% ( 300 cell differential) with 15% monocytes; trilineage dysplasia; increased numbers of megakaryocytes and 90-100% cellular particles.  Becauseofthe high WBC I did my counting very close to the particles to void any dilution effect from the peripheral blood.  1% blasts in PBS and 19% monocytes.  JMB    Impression and Recommendations:  Lindsay Brennan is a 85 y.o. 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  ???  Started on hydrea 500mg  BID initially, then increased to 1000mg  BID. Held on 05/20/19 for anemia/thrombocytopenia requiring pRBC transfusion  Repeat CBC today shows resolution of thrombocytopenia, improved anemia. Will resume hydrea 500mg  daily for now. Repeat labs in 1 week and titrate up as needed/tolerated  Continue allopurinol  Will contact PCP and nephrology regarding uncontrolled BP   Her dizziness was initially thought to have been related to hyperleukocytosis, as there has been no notable improvement with control of her WBC, this is less likely and alterate causes should be investigated - she will follow up with Dr. Deno Etienne.    06/04/19 CBC stable. Continue hydrea 500mg  BID. Repeat in CBC in 2 weeks. Follow up with Dr. Rico Sheehan for HTN and travel clearance. Ok to travel home from a hematologic perspective.     This Clinical research associate has deemed the above diagnoses to have a risk of complication, morbidity or mortality of:   []  Minimal  []  Low  []  Moderate   []  due to prescription drug management   [] Decision re: minor surgery   [] Diagnosis or treatment limited by social determinants of health  [x]  Severe    [x]  due to intensive monitoring for chemotherapy toxicity   []  Decision elective surgery with risk factors  []  Decision regarding need for hospitalization  []  Advanced Care Planning decisions    Return to clinic:  2  week.    Lindsay Brennan K. Leeah Politano, DO

## 2019-06-07 ENCOUNTER — Ambulatory Visit: Payer: PRIVATE HEALTH INSURANCE

## 2019-06-07 DIAGNOSIS — C9310 Chronic myelomonocytic leukemia not having achieved remission: Secondary | ICD-10-CM

## 2019-06-08 ENCOUNTER — Telehealth: Payer: PRIVATE HEALTH INSURANCE

## 2019-06-08 ENCOUNTER — Ambulatory Visit: Payer: MEDICARE

## 2019-06-08 DIAGNOSIS — Z862 Personal history of diseases of the blood and blood-forming organs and certain disorders involving the immune mechanism: Secondary | ICD-10-CM

## 2019-06-08 DIAGNOSIS — I129 Hypertensive chronic kidney disease with stage 1 through stage 4 chronic kidney disease, or unspecified chronic kidney disease: Secondary | ICD-10-CM

## 2019-06-08 DIAGNOSIS — N189 Chronic kidney disease, unspecified: Principal | ICD-10-CM

## 2019-06-08 DIAGNOSIS — N179 Acute kidney failure, unspecified: Secondary | ICD-10-CM

## 2019-06-08 DIAGNOSIS — D631 Anemia in chronic kidney disease: Secondary | ICD-10-CM

## 2019-06-08 DIAGNOSIS — R801 Persistent proteinuria, unspecified: Secondary | ICD-10-CM

## 2019-06-08 DIAGNOSIS — N200 Calculus of kidney: Secondary | ICD-10-CM

## 2019-06-08 DIAGNOSIS — E876 Hypokalemia: Secondary | ICD-10-CM

## 2019-06-08 DIAGNOSIS — N184 Chronic kidney disease, stage 4 (severe): Secondary | ICD-10-CM

## 2019-06-08 DIAGNOSIS — I10 Essential (primary) hypertension: Secondary | ICD-10-CM

## 2019-06-08 MED ORDER — SODIUM BICARBONATE 650 MG PO TABS
ORAL_TABLET | 1 refills | 30.00000 days | Status: AC
Start: 2019-06-08 — End: ?

## 2019-06-08 MED ORDER — CLONAZEPAM 0.5 MG PO TABS
.25 mg | ORAL_TABLET | ORAL | 0 refills | Status: AC | PRN
Start: 2019-06-08 — End: ?

## 2019-06-08 MED ORDER — CLONIDINE HCL 0.1 MG PO TABS
0.1 mg | ORAL_TABLET | Freq: Every day | ORAL | 0 refills | Status: AC | PRN
Start: 2019-06-08 — End: ?

## 2019-06-08 MED ADMIN — CLONIDINE HCL 0.1 MG PO TABS: .1 mg | ORAL | @ 20:00:00 | Stop: 2019-06-08

## 2019-06-08 NOTE — Progress Notes
Outpatient Nephrology Consultation    Date of Service: Jun 08, 2019     Reason for Consultation: Elevated creatinine-CKD- HTN  Chief Complaint   Patient presents with   ??? Follow-up       History of Present Illness:    4.27.21 : This is a pleasant 85 y.o. female patient with past medical history of chronic kidney disease, hypertension, MDS, anemia, dyslipidemia is here to transfer care.  Patient is from [place] has nephrologist and primary care doctor there now visiting her daughter here which she is planning to go back in few weeks to [place].  Was seen by different nephrologist at [facility] also.  Has history of MDS, was receiving Aranesp, has missed a dose recently when moved to [facility].  Hemoglobin dropped required transfusions.  Today here is enquiring about ESA on the 1st visit.  Unfortunately blood pressure was very elevated so unable to give ESA 8 today.  Patient to come back next week if blood pressure controlled, etiology of underlying chronic kidney disease is unknown but patient been diagnosed for almost 5 years and may likely be contributed to high blood pressure.  Is accompanied with daughter in the clinic today.      5.4.21: started MTP , not on kcl pills , doing low k diet, BP high here at home base d on log <150 s,, ESA was not covered by insurance    ?  ROS:  Pertinent ROS  Performed , negative except if mentioned in HPI above ,  ?  Past Medical and Surgical History:  Patient Active Problem List   Diagnosis   ??? Anemia in chronic kidney disease   ??? Chronic kidney disease due to hypertension   ??? Proteinuria   ??? Myelodysplasia (myelodysplastic syndrome) (HCC/RAF)   ??? Leukemia (HCC/RAF)   ??? Hypothyroidism   ??? Hypertension   ??? Hyperlipidemia   ??? History of ductal carcinoma in situ (DCIS) of breast   ??? History of CVA (cerebrovascular accident)   ??? Hyperleukocytosis   ??? CKD (chronic kidney disease)   ??? 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 GERD (gastroesophageal reflux disease) Past Medical History:   Diagnosis Date   ??? CKD (chronic kidney disease)    ??? 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 GERD (gastroesophageal reflux disease)    ??? History of CVA (cerebrovascular accident) 2011   ??? History of ductal carcinoma in situ (DCIS) of breast     s/p b/l mastectomy   ??? Hyperlipidemia    ??? Hypertension    ??? Hypothyroidism    ??? Leukemia (HCC/RAF)    ??? Myelodysplasia (myelodysplastic syndrome) (HCC/RAF)      Past Surgical History:   Procedure Laterality Date   ??? CESAREAN SECTION     ??? MASTECTOMY Bilateral    ??? TOTAL ABDOMINAL HYSTERECTOMY         Family History:  No pertinent known family history of Renal disease except if mentioned in HPI .  Family History   Problem Relation Age of Onset   ??? Stroke Mother    ??? Emphysema Father    ??? Ovarian cancer Sister    ??? Heart attack Brother    ??? Lung cancer Sister    ??? Lung cancer Sister    ??? Colon cancer Brother    ??? Colon cancer Son        Social History:  Denies recent alcohol or drug abuse .  Social History     Socioeconomic History   ??? Marital status: Widowed     Spouse name: Not on file   ??? Number of children: Not on file   ??? Years of education: Not on file   ??? Highest education level: Not on file   Occupational History   ??? Not on file   Social Needs   ??? Financial resource strain: Not on file   ??? Food insecurity     Worry: Not on file     Inability: Not on file   ??? Transportation needs     Medical: Not on file     Non-medical: Not on file   Tobacco Use   ??? Smoking status: Never Smoker   ??? Smokeless tobacco: Never Used   Substance and Sexual Activity   ??? Alcohol use: Not Currently   ??? Drug use: Never   ??? Sexual activity: Not on file   Lifestyle   ??? Physical activity     Days per week: Not on file     Minutes per session: Not on file   ??? Stress: Not on file   Relationships   ??? Social Wellsite geologist on phone: Not on file     Gets together: Not on file     Attends religious service: Not on file     Active member of club or organization: Not on file     Attends meetings of clubs or organizations: Not on file     Relationship status: Not on file   Other Topics Concern   ??? Need Help Feeding Yourself? Not Asked   ??? Need Help Getting Dressed? Not Asked   ??? Need Help Using the Telephone? Not Asked   ??? Need Help Managing Money? Tree surgeon, [employer]) Not Asked   ??? Need Help Shopping for Groceries? Not Asked   ??? Need Help Getting Places Beyond Walking Distance? [employer], [employer]) Not Asked   ??? Need Help Getting from Bed to Chair? Not Asked   ??? Need Help Bathing or Showering? Not Asked   ??? Need Help Taking your Medications? Not Asked   ??? Need Help Doing Moderately Strenuous Housework? (ex. Laundry) Not Asked   ??? Need Help Driving? Not Asked   ??? Need Help Getting to the Toilet? Not Asked   ??? Need Help Walking Across the Road? (Includes Ephraim Hamburger) Not Asked   ??? Need Help Preparing Meals? Not Asked   ??? Need Help Shopping for Personal Items? (Toiletries, Medicines) Not Asked   ??? Need Help Climbing a Flight of Stairs? Not Asked   ??? Do you live with someone who assists you at home? Not Asked   ??? Do you get help from family members or friends in your home? Not Asked   ??? Do you employ someone to provide health related care or help you in your home? Not Asked   ??? Do you provide care for a family member? Not Asked   ??? Does your home have rugs in the hallway? Not Asked   ??? Does your home have poor lighting? Not Asked   ??? Does your home lack grab bars in the bathroom? Not Asked   ??? Does your home lack handrails on the stairs? Not Asked   ??? Have you noticed any hearing difficulties? Not Asked   ??? Do you currently participate in any regular activity to improve or maintain your physical fitness? Not Asked   ??? Do  you always wear a seatbelt when you ride in a car? Not Asked   ??? If you drink alcohol, do you drink more than 7 drinks per week or more than 3 drinks on any given day? Not Asked   ??? Has anyone ever been concerned about your drinking? Not Asked   ??? Do you exercise at least a day, 3 or more days a week? No   ??? Types of Exercise? (List in Comments) No   ??? Do you follow a special diet? No   ??? Vegan? No   ??? Vegetarian? No   ??? Pescatarian? No   ??? Lactose Free? No   ??? Gluten Free? No   ??? Omnivore? No   Social History Narrative   ??? Not on file       Medications:    Outpatient Encounter Medications as of 06/08/2019   Medication Sig Dispense Refill   ??? allopurinol 100 mg tablet Take 1/2 tablets (50 mg total) by mouth daily. 90 tablet 0   ??? aspirin 81 mg chewable tablet Chew 1 tablet (81 mg total) by mouth daily. 90 tablet 0   ??? diclofenac Sodium 1% gel Apply 2 g topically every six (6) hours as needed (for L shoulder pain). 350 g 0   ??? dilTIAZem (CARDIZEM CD) 120 mg 24 hr capsule Take 1 capsule (120 mg total) by mouth daily. 90 capsule 0   ??? epoetin alfa-epbx (RETACRIT) 10,000 units/mL injection Inject 0.73 mLs (7,300 Units total) under the skin three (3) times a week. 20 mL 0   ??? esomeprazole 40 mg capsule Take 40 mg by mouth daily .     ??? Ferric Citrate 1 GM 210 MG(Fe) TABS Take 210 mg by mouth three (3) times daily with meals.     ??? gabapentin 100 mg capsule Take 2 capsules (200 mg total) by mouth at bedtime. 90 capsule 0   ??? guaiFENesin 100 mg/5 mL syrup Take 5 mLs (100 mg total) by mouth every six (6) hours as needed. 180 mL 0   ??? hydroxyurea 500 mg capsule Take 2 capsules (1,000 mg total) by mouth every morning and take 1 capsule (500 mg total) by mouth every evening. 90 capsule 0   ??? hydroxyurea 500 mg capsule Take 1 capsule (500 mg total) by mouth every evening. (Patient not taking: Reported on 06/01/2019.) 90 capsule 0   ??? loratadine 10 mg tablet Take ONE-HALF tablets (5 mg total) by mouth daily. 90 tablet 0   ??? losartan 50 mg tablet Take 1 tablet (50 mg total) by mouth daily. 90 tablet 0   ??? magnesium oxide 400 mg tablet Take 400 mg by mouth daily Hold for loose stools.Marland Kitchen     ??? metoprolol succinate 50 mg 24 hr tablet Take 1 tablet (50 mg total) by mouth daily. 90 tablet 1   ??? polyethylene glycol powder packet Take 1 packet (17 g total) by mouth daily. (Patient not taking: Reported on 06/01/2019.)     ??? rosuvastatin 20 mg tablet Take 20 mg by mouth at bedtime .     ??? SODIUM BICARBONATE 650 mg tablet TAKE ONE TABLET BY MOUTH TWICE A DAY 60 tablet 1   ??? sodium bicarbonate 650 mg tablet Take 650 mg by mouth two (2) times daily.     ??? SYNTHROID 50 mcg tablet Take 50 mcg by mouth every other day Pt alternates with 75mg .     ??? SYNTHROID 75 mcg tablet Take 75 mcg by  mouth every other day Pt alternates with 50mg .       Facility-Administered Encounter Medications as of 06/08/2019   Medication Dose Route Frequency Provider Last Rate Last Admin   ??? epoetin alfa-epbx (Retacrit) 10,000 units/mL inj 10,000 Units  10,000 Units Subcutaneous Q7 Days Boonpheng, Boonphiphop, MD           Allergies:    Allergies   Allergen Reactions   ??? Coffee      Reported to RD   ??? Oxycodone-Acetaminophen Other (See Comments)   ??? Penicillins Hives   ??? Sulfa Antibiotics Agranulocytosis   ??? Naproxen Rash     Aleve         Physical Exam:    Vitals: Resp 16  ~ Ht 5' 1'' (1.549 m)  ~ BMI 19.80 kg/m???     Weight:        Results:    I reviewed recent  renal pertinent laboratory and/or imaging results including but not limited to the following and discussed the pertinent results with pt/family/caregiver  .        Lab Results   Component Value Date    NA 137 06/04/2019    K 4.1 06/04/2019    CL 97 06/04/2019    CO2 27 06/04/2019    BUN 48 (H) 06/04/2019    CREAT 1.79 (H) 06/04/2019    GLUCOSE 85 06/04/2019       Lab Results   Component Value Date    CALCIUM 9.8 06/04/2019    ICALCOR 1.18 05/24/2019    PHOS 3.2 05/24/2019    MG 1.3 (L) 05/11/2019    ALBUMIN 4.0 05/14/2019       Lab Results   Component Value Date    WBC 10.2 (H) 06/04/2019    HGB 9.4 (L) 06/04/2019    HCT 30.4 (L) 06/04/2019    MCV 91.6 06/04/2019    PLT 216 06/04/2019       Lab Results   Component Value Date    SPECGRAVUR 1.018 05/14/2019    PHUR 6.0 05/14/2019    BLDUR 1+ (A) 05/14/2019    BILIUR Negative 05/14/2019    KETONESUR Negative 05/14/2019    GLUCOSEUR Negative 05/14/2019    PROTCLUR 2+ (A) 05/14/2019    NITRITEUR Negative 05/14/2019    LEUKESTUR Negative 05/14/2019    RBCSUR 19 (H) 05/14/2019    WBCSUR 15 05/14/2019       Lab Results   Component Value Date    NARANUR 92 05/10/2019    UNRANUR 457 04/19/2019    KRANUR 49 04/30/2019    CREATRANUR 30.4 05/10/2019    CREATRANUR 30.4 05/10/2019         Recent Results (from the past 04540 hours)   US kidney non-vascular bilat (bladder images included) [IMG1080]    Status: Normal 05/11/2019  2:32 PM    Impression IMPRESSION:    Mild right hydronephrosis, unchanged since prior.  Left pelviectasis.          Signed by: Lane Hacker   05/11/2019 2:32 PM       Recent Results (from the past 98119 hours)   [place] abd complete [IMG524]    Status: Normal 05/11/2019  8:50 AM    Impression IMPRESSION:    1. Marked splenomegaly.  Mild hepatomegaly.    2. Borderline right hydronephrosis.    I, Lane Hacker, M.D., have reviewed this radiological study personally and I am in full agreement with the findings of the report presented here.  Signed by: Lane Hacker   05/11/2019 8:50 AM     ??  Assessment and Plan:    Lindsay Brennan is a 85 y.o.female with CKD, HTN, MDS and hypothyroidism. :    1. CKD (chronic kidney disease) stage 4, GFR 15-29 ml/min (HCC/RAF)  Now Cr stable at 1.6-1.8 range   Patient is living in [place] and has nephrologist there, is moving back soon in few weeks,  Been diagnosed with chronic kidney disease 4 for 5 years now, underlying etiology is unknown,Likely hypertension as blood pressure been uncontrolled    -advised to avoid nephrotoxic medications like NSAIDs (Ibuprofen, Motrin, Aleve, Naproxen,.Marland Kitchen). Tylenol in moderation ok to take for pain from kidney standpoint.  -when estimated GFR <30, avoid gadolinium contrast exposure with MRI and also avoid metformin .  -use caution with medications with ingredients which could accumulate in CKD including aluminum (sucralfate), magnesium (maalox, milk of mag), phosphorous (fleets).  -Avoid iodinated contrast for CT Scans, as possible.    -recommended to follow these dietary recommendations in patient with chronic kidney disease GFR below 60:  -Low Protein Diet 0.6-0.8 g/kg/day.  -low  Sodium intake  2-3 grams/day  .  -Low Potassium intake if potassium above 4.7 , which means avoiding (avocado, potato(chips, fries, mash potato), tomato(ketchup, juice), banana, [place] juice, tropical fruits, cactus,???).  -Phosphorus intake limited at 800 mg/day unless your phosphorus is below 3.5.     2. History of anemia due to CKD/ hx of MDS   Unable to give ESA as BP very high and insurance did not approve   Plan was ESA , per notes Hem approved     3. Nephrolithiasis  No current symptoms     4. Hypokalemia , resolved now on higher side to NL   Is doing low k diet,  Not on  po KCL anymore   rule out hyperaldosteronism due to hypertension and hx of hypokalemia    5. Essential hypertension  Uncontrolled  Low salt diet advised  currently on dilt 120 , losartan 50 ,   Restart MTP 50 mg daily monitor the heart rate  Used to be on clonidine   - cloNIDine tab 0.1 mg given in clinic today as BP very high   Started clondine PRN if SBP>180      RTC in 1 wk  with lab studies prior.     More than  30 minutes was spent on the day of the visit for this visit, including time spent on reviewing  the labs, charts ,available renal related  imagings ,counseling, discussing previous available renal related lab results, treatment plans, ordering tests/medicines if needed, answering the patient's questions  ,coordinating care and documentation .    Vivan Agostino Jamey Reas M.D.  Division of Nephrology-[place]  Jun 08, 2019  ?  Parts of this note was transcribed via Fluency Direct dictation system. Please excuse any Grammatical or typographycal errors. if any confusion can contact me directly.

## 2019-06-08 NOTE — Telephone Encounter
[place] to see if patient is eligible for ''[place]'' (RETACRIT). Patient is not eligible through Medicare. I also called [facility] spoke with several representatives spoke with Marliss Czar and she stated that patient did not need a prior authorization but was unable to tell if she was eligible for a buy and bill.    Provider Line [phone]  Pre Certification [phone] option #3       [id number]    Ref# [id number] (spoke with Ulice Dash)

## 2019-06-09 LAB — Basic Metabolic Panel
GFR ESTIMATE FOR NON-AFRICAN AMERICAN: 26 mL/min/{1.73_m2} (ref 7–22)
UREA NITROGEN: 50 mg/dL — ABNORMAL HIGH (ref 7–22)

## 2019-06-10 LAB — Aldosterone,Serum: ALDOSTERONE,SERUM: 17.9 ng/dL

## 2019-06-11 ENCOUNTER — Telehealth: Payer: PRIVATE HEALTH INSURANCE

## 2019-06-11 DIAGNOSIS — E876 Hypokalemia: Secondary | ICD-10-CM

## 2019-06-11 DIAGNOSIS — I129 Hypertensive chronic kidney disease with stage 1 through stage 4 chronic kidney disease, or unspecified chronic kidney disease: Secondary | ICD-10-CM

## 2019-06-11 DIAGNOSIS — N200 Calculus of kidney: Secondary | ICD-10-CM

## 2019-06-11 DIAGNOSIS — R801 Persistent proteinuria, unspecified: Secondary | ICD-10-CM

## 2019-06-11 DIAGNOSIS — N179 Acute kidney failure, unspecified: Secondary | ICD-10-CM

## 2019-06-11 DIAGNOSIS — N184 Chronic kidney disease, stage 4 (severe): Secondary | ICD-10-CM

## 2019-06-11 DIAGNOSIS — I10 Essential (primary) hypertension: Secondary | ICD-10-CM

## 2019-06-11 DIAGNOSIS — N189 Chronic kidney disease, unspecified: Secondary | ICD-10-CM

## 2019-06-11 DIAGNOSIS — D631 Anemia in chronic kidney disease: Secondary | ICD-10-CM

## 2019-06-11 DIAGNOSIS — Z862 Personal history of diseases of the blood and blood-forming organs and certain disorders involving the immune mechanism: Secondary | ICD-10-CM

## 2019-06-11 NOTE — Telephone Encounter
Call Back Request    MD:   Goshtaseb    Reason for call back: Darleen with [facility] states manual BP 160/74 on both arms and pt denies headaches. They will hold off on epoetin injection for now as per MD orders to hold injection if BP is greater than 150/90    Any Symptoms:  []  Yes  [x]  No      ? If yes, what symptoms are you experiencing:    o Duration of symptoms (how long):    o Have you taken medication for symptoms (OTC or Rx):      Patient or caller has been notified of the 24-48 hour turnaround time.

## 2019-06-11 NOTE — Telephone Encounter
Outpatient Nephrology Consultation    Date of Service: Jun 11, 2019     Reason for Consultation: Elevated creatinine-CKD- HTN  No chief complaint on file.    Patient Consent to Telehealth Questionnaire   [facility] TELEHEALTH PRECHECKIN QUESTIONS 06/03/2019   By clicking ''I Agree'', I consent to the below:  I Agree     - I agree  to be treated via a video visit and acknowledge that I may be liable for any relevant copays or coinsurance depending on my insurance plan.  - I understand that this video visit is offered for my convenience and I am able to cancel and reschedule for an in-person appointment if I desire.  - I also acknowledge that sensitive medical information may be discussed during this video visit appointment and that it is my responsibility to locate myself in a location that ensures privacy to my own level of comfort.  - I also acknowledge that I should not be participating in a video visit in a way that could cause danger to myself or to those around me (such as driving or walking).  If my provider is concerned about my safety, I understand that they have the right to terminate the visit.     Locations, Physician : at office ,  Patient: at home       History of Present Illness:    4.27.21 : This is a pleasant 85 y.o. female patient with past medical history of chronic kidney disease, hypertension, MDS, anemia, dyslipidemia is here to transfer care.  Patient is from [place] has nephrologist and primary care doctor there now visiting here with daughter here which she is planning to go back in few weeks to [place].  Was seen by different nephrologists at [facility] also.  Has history of MDS, was receiving Aranesp, has missed a dose recently when moved to [facility].  Hemoglobin dropped required transfusions.  Today here is enquiring about ESA. Unfortunately blood pressure was very elevated so unable to give ESA  today.  Patient to come back next week if blood pressure controlled, etiology of underlying chronic kidney disease is unknown but patient been diagnosed for almost 5 years and may likely be contributed to high blood pressure.  Is accompanied with daughter in the clinic today.      5.4.21: started MTP , not on kcl pills , doing low k diet, BP high here at home base d on log <150 s,, ESA was not covered by insurance      5.7.21: FU via phone .BP 160/74 , talked to pt and daughter, EPo dose held as BP high . Is leaving next week . Did labs aldos/ Renin ratio high    ?  ROS:  Pertinent ROS  Performed , negative except if mentioned in HPI above ,  ?  Past Medical and Surgical History:  Patient Active Problem List   Diagnosis   ??? Anemia in chronic kidney disease   ??? Chronic kidney disease due to hypertension   ??? Proteinuria   ??? Myelodysplasia (myelodysplastic syndrome) (HCC/RAF)   ??? Leukemia (HCC/RAF)   ??? Hypothyroidism   ??? Hypertension   ??? Hyperlipidemia   ??? History of ductal carcinoma in situ (DCIS) of breast   ??? History of CVA (cerebrovascular accident)   ??? Hyperleukocytosis   ??? CKD (chronic kidney disease)   ??? 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 GERD (gastroesophageal reflux disease)     Past Medical History:  Diagnosis Date   ??? CKD (chronic kidney disease)    ??? 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 GERD (gastroesophageal reflux disease)    ??? History of CVA (cerebrovascular accident) 2011   ??? History of ductal carcinoma in situ (DCIS) of breast     s/p b/l mastectomy   ??? Hyperlipidemia    ??? Hypertension    ??? Hypothyroidism    ??? Leukemia (HCC/RAF)    ??? Myelodysplasia (myelodysplastic syndrome) (HCC/RAF)      Past Surgical History:   Procedure Laterality Date   ??? CESAREAN SECTION     ??? MASTECTOMY Bilateral    ??? TOTAL ABDOMINAL HYSTERECTOMY         Family History:  No pertinent known family history of Renal disease except if mentioned in HPI .  Family History   Problem Relation Age of Onset   ??? Stroke Mother    ??? Emphysema Father    ??? Ovarian cancer Sister    ??? Heart attack Brother    ??? Lung cancer Sister    ??? Lung cancer Sister    ??? Colon cancer Brother    ??? Colon cancer Son        Social History:  Denies recent alcohol or drug abuse .    Social History     Socioeconomic History   ??? Marital status: Widowed     Spouse name: Not on file   ??? Number of children: Not on file   ??? Years of education: Not on file   ??? Highest education level: Not on file   Occupational History   ??? Not on file   Social Needs   ??? Financial resource strain: Not on file   ??? Food insecurity     Worry: Not on file     Inability: Not on file   ??? Transportation needs     Medical: Not on file     Non-medical: Not on file   Tobacco Use   ??? Smoking status: Never Smoker   ??? Smokeless tobacco: Never Used   Substance and Sexual Activity   ??? Alcohol use: Not Currently   ??? Drug use: Never   ??? Sexual activity: Not on file   Lifestyle   ??? Physical activity     Days per week: Not on file     Minutes per session: Not on file   ??? Stress: Not on file   Relationships   ??? Social Wellsite geologist on phone: Not on file     Gets together: Not on file     Attends religious service: Not on file     Active member of club or organization: Not on file     Attends meetings of clubs or organizations: Not on file     Relationship status: Not on file   Other Topics Concern   ??? Need Help Feeding Yourself? Not Asked   ??? Need Help Getting Dressed? Not Asked   ??? Need Help Using the Telephone? Not Asked   ??? Need Help Managing Money? Tree surgeon, [employer]) Not Asked   ??? Need Help Shopping for Groceries? Not Asked   ??? Need Help Getting Places Beyond Walking Distance? [employer], [employer]) Not Asked   ??? Need Help Getting from Bed to Chair? Not Asked   ??? Need Help Bathing or Showering? Not Asked   ??? Need Help Taking your Medications? Not Asked   ??? Need Help  Doing Moderately Strenuous Housework? (ex. Laundry) Not Asked   ??? Need Help Driving? Not Asked   ??? Need Help Getting to the Toilet? Not Asked   ??? Need Help Walking Across the Road? (Includes Ephraim Hamburger) Not Asked   ??? Need Help Preparing Meals? Not Asked   ??? Need Help Shopping for Personal Items? (Toiletries, Medicines) Not Asked   ??? Need Help Climbing a Flight of Stairs? Not Asked   ??? Do you live with someone who assists you at home? Not Asked   ??? Do you get help from family members or friends in your home? Not Asked   ??? Do you employ someone to provide health related care or help you in your home? Not Asked   ??? Do you provide care for a family member? Not Asked   ??? Does your home have rugs in the hallway? Not Asked   ??? Does your home have poor lighting? Not Asked   ??? Does your home lack grab bars in the bathroom? Not Asked   ??? Does your home lack handrails on the stairs? Not Asked   ??? Have you noticed any hearing difficulties? Not Asked   ??? Do you currently participate in any regular activity to improve or maintain your physical fitness? Not Asked   ??? Do you always wear a seatbelt when you ride in a car? Not Asked   ??? If you drink alcohol, do you drink more than 7 drinks per week or more than 3 drinks on any given day? Not Asked   ??? Has anyone ever been concerned about your drinking? Not Asked   ??? Do you exercise at least a day, 3 or more days a week? No   ??? Types of Exercise? (List in Comments) No   ??? Do you follow a special diet? No   ??? Vegan? No   ??? Vegetarian? No   ??? Pescatarian? No   ??? Lactose Free? No   ??? Gluten Free? No   ??? Omnivore? No   Social History Narrative   ??? Not on file       Medications:    Outpatient Encounter Medications as of 06/11/2019   Medication Sig Dispense Refill   ??? allopurinol 100 mg tablet Take 1/2 tablets (50 mg total) by mouth daily. 90 tablet 0   ??? aspirin 81 mg chewable tablet Chew 1 tablet (81 mg total) by mouth daily. 90 tablet 0   ??? clonazePAM 0.5 mg tablet Take 0.5 tablets (0.25 mg total) by mouth as needed for for Anxiety (take half pill right before the flight ,). 2 tablet 0   ??? cloNIDine 0.1 mg tablet Take 1 tablet (0.1 mg total) by mouth daily as needed. 14 tablet 0   ??? diclofenac Sodium 1% gel Apply 2 g topically every six (6) hours as needed (for L shoulder pain). 350 g 0   ??? dilTIAZem (CARDIZEM CD) 120 mg 24 hr capsule Take 1 capsule (120 mg total) by mouth daily. 90 capsule 0   ??? epoetin alfa-epbx (RETACRIT) 10,000 units/mL injection Inject 0.73 mLs (7,300 Units total) under the skin three (3) times a week. 20 mL 0   ??? esomeprazole 40 mg capsule Take 40 mg by mouth daily .     ??? Ferric Citrate 1 GM 210 MG(Fe) TABS Take 210 mg by mouth three (3) times daily with meals.     ??? gabapentin 100 mg capsule Take 2 capsules (200 mg total) by mouth  at bedtime. (Patient taking differently: Take 200 mg by mouth daily 1 capsule at bedtime.Marland Kitchen) 90 capsule 0   ??? guaiFENesin 100 mg/5 mL syrup Take 5 mLs (100 mg total) by mouth every six (6) hours as needed. 180 mL 0   ??? hydroxyurea 500 mg capsule Take 2 capsules (1,000 mg total) by mouth every morning and take 1 capsule (500 mg total) by mouth every evening. 90 capsule 0   ??? hydroxyurea 500 mg capsule Take 1 capsule (500 mg total) by mouth every evening. 90 capsule 0   ??? loratadine 10 mg tablet Take ONE-HALF tablets (5 mg total) by mouth daily. 90 tablet 0   ??? losartan 50 mg tablet Take 1 tablet (50 mg total) by mouth daily. 90 tablet 0   ??? magnesium oxide 400 mg tablet Take 400 mg by mouth daily Hold for loose stools.Marland Kitchen     ??? metoprolol succinate 50 mg 24 hr tablet Take 1 tablet (50 mg total) by mouth daily. 90 tablet 1   ??? polyethylene glycol powder packet Take 1 packet (17 g total) by mouth daily.     ??? rosuvastatin 20 mg tablet Take 20 mg by mouth at bedtime .     ??? SODIUM BICARBONATE 650 mg tablet TAKE ONE TABLET BY MOUTH TWICE A DAY 60 tablet 1   ??? sodium bicarbonate 650 mg tablet Take 650 mg by mouth two (2) times daily.     ??? SYNTHROID 50 mcg tablet Take 50 mcg by mouth every other day Pt alternates with 75mg .     ??? SYNTHROID 75 mcg tablet Take 75 mcg by mouth every other day Pt alternates with 50mg . Facility-Administered Encounter Medications as of 06/11/2019   Medication Dose Route Frequency Provider Last Rate Last Admin   ??? epoetin alfa-epbx (Retacrit) 10,000 units/mL inj 10,000 Units  10,000 Units Subcutaneous Q7 Days Boonpheng, Boonphiphop, MD           Allergies:    Allergies   Allergen Reactions   ??? Coffee      Reported to RD   ??? Oxycodone-Acetaminophen Other (See Comments)   ??? Penicillins Hives   ??? Sulfa Antibiotics Agranulocytosis   ??? Naproxen Rash     Aleve         Physical Exam:    Vitals: Resp 16  ~ Ht 5' 1'' (1.549 m)  ~ BMI 19.80 kg/m???     Weight:        Results:    I reviewed recent  renal pertinent laboratory and/or imaging results including but not limited to the following and discussed the pertinent results with pt/family/caregiver  .        Lab Results   Component Value Date    NA 135 06/08/2019    K 4.5 06/08/2019    CL 97 06/08/2019    CO2 27 06/08/2019    BUN 50 (H) 06/08/2019    CREAT 1.76 (H) 06/08/2019    GLUCOSE 91 06/08/2019       Lab Results   Component Value Date    CALCIUM 9.5 06/08/2019    ICALCOR 1.18 05/24/2019    PHOS 3.2 05/24/2019    MG 1.3 (L) 05/11/2019    ALBUMIN 4.0 05/14/2019       Lab Results   Component Value Date    WBC 10.2 (H) 06/04/2019    HGB 9.4 (L) 06/04/2019    HCT 30.4 (L) 06/04/2019    MCV 91.6 06/04/2019    PLT 216  06/04/2019       Lab Results   Component Value Date    SPECGRAVUR 1.018 05/14/2019    PHUR 6.0 05/14/2019    BLDUR 1+ (A) 05/14/2019    BILIUR Negative 05/14/2019    KETONESUR Negative 05/14/2019    GLUCOSEUR Negative 05/14/2019    PROTCLUR 2+ (A) 05/14/2019    NITRITEUR Negative 05/14/2019    LEUKESTUR Negative 05/14/2019    RBCSUR 19 (H) 05/14/2019    WBCSUR 15 05/14/2019       Lab Results   Component Value Date    NARANUR 92 05/10/2019    UNRANUR 457 04/19/2019    KRANUR 49 04/30/2019    CREATRANUR 30.4 05/10/2019    CREATRANUR 30.4 05/10/2019         Recent Results (from the past 16109 hours)   US kidney non-vascular bilat (bladder images included) [IMG1080]    Status: Normal 05/11/2019  2:32 PM    Impression IMPRESSION:    Mild right hydronephrosis, unchanged since prior.  Left pelviectasis.          Signed by: Lane Hacker   05/11/2019 2:32 PM       Recent Results (from the past 60454 hours)   [place] abd complete [IMG524]    Status: Normal 05/11/2019  8:50 AM    Impression IMPRESSION:    1. Marked splenomegaly.  Mild hepatomegaly.    2. Borderline right hydronephrosis.    I, Lane Hacker, M.D., have reviewed this radiological study personally and I am in full agreement with the findings of the report presented here.    Signed by: Lane Hacker   05/11/2019 8:50 AM     ??  Assessment and Plan:    Lindsay Brennan is a 85 y.o.female with CKD, HTN, MDS and hypothyroidism. :    1. CKD (chronic kidney disease) stage 4, GFR 15-29 ml/min (HCC/RAF)  Now Cr stable at 1.6-1.8 range   Patient is living in [place] and has nephrologist there, is moving back soon in a week,  Been diagnosed with chronic kidney disease 4 for 5 years now, underlying etiology is unknown,Likely hypertension as blood pressure been uncontrolled    -advised to avoid nephrotoxic medications like NSAIDs (Ibuprofen, Motrin, Aleve, Naproxen,.Marland Kitchen). Tylenol in moderation ok to take for pain from kidney standpoint.  -when estimated GFR <30, avoid gadolinium contrast exposure with MRI and also avoid metformin .  -use caution with medications with ingredients which could accumulate in CKD including aluminum (sucralfate), magnesium (maalox, milk of mag), phosphorous (fleets).  -Avoid iodinated contrast for CT Scans, as possible.    -recommended to follow these dietary recommendations in patient with chronic kidney disease GFR below 60:  -Low Protein Diet 0.6-0.8 g/kg/day.  -low  Sodium intake  2-3 grams/day  .  -Low Potassium intake if potassium above 4.7 , which means avoiding (avocado, potato(chips, fries, mash potato), tomato(ketchup, juice), banana, [place] juice, tropical fruits, cactus,???).  -Phosphorus intake limited at 800 mg/day unless your phosphorus is below 3.5.     2. History of anemia due to CKD/ hx of MDS   Unable to give ESA as BP very high and insurance did not approve   Plan was ESA , per notes Hem approved     3. Nephrolithiasis  No current symptoms     4. Hypokalemia , resolved now on higher side to NL   Is doing low k diet,  Not on  po KCL anymore   rule out hyperaldosteronism due to hypertension and hx  of hypokalemia    Advised pt and daughter to See endocrinologist in [place] as  Aldos/Renin is high 17.7 / 0.1 = 177 to rule out primary hyperaldosteronism , verbalized understanding .     5. Essential hypertension  Uncontrolled getting   Low salt diet advised  currently on dilt 120 , losartan 50 ,   Increase MTP to 100 mg daily monitor the heart rate if hr ,55 to inform us  Used to be on clonidine   Started clondine PRN if SBP>160  As A/R is high can benefit from aldactone addition can consider after seeing endocrine       RTC PRN as pt is leaving [place] now to [place]     More than  11 minutes was spent on the day of the visit for this visit, including time spent on reviewing  the labs, charts ,available renal related  imagings ,counseling, discussing previous available renal related lab results, treatment plans, ordering tests/medicines if needed, answering the patient's questions  ,coordinating care and documentation .    Elmin Wiederholt Jamey Reas M.D.  Division of Nephrology[facility]  Jun 11, 2019  ?  Parts of this note was transcribed via Fluency Direct dictation system. Please excuse any Grammatical or typographycal errors. if any confusion can contact me directly.

## 2019-06-14 ENCOUNTER — Ambulatory Visit: Payer: MEDICARE

## 2019-06-16 ENCOUNTER — Telehealth: Payer: PRIVATE HEALTH INSURANCE

## 2019-06-16 NOTE — Telephone Encounter
Message to Practice/Provider      Message: Lindsay Brennan would like to Dr Darla Lesches pt will be DC today    Return call is not being requested by the patient or caller.    Patient or caller has been notified of the 24-48 hour processing turnaround time if applicable.

## 2019-06-17 NOTE — Telephone Encounter
Please clarify as I do not understand what the question is here.  I was the admitting MD in early April when she was hospitalized, but I am not her PCP.  I suspect this is a request for post hospital follow up, but they need to be aware that my clinic is in [facility] and I will not be in clinic next week as I will be on the inpatient service    Thank you  Sriya Kroeze L. Darla Lesches, [facility]  [phone]

## 2019-07-08 ENCOUNTER — Telehealth: Payer: MEDICARE

## 2019-07-08 NOTE — Telephone Encounter
PDL Call to Practice    Reason for Call: caller wanted to speak to office regarding the fax documents they sent to the office of Dr. Darla Lesches that needed to be signed      Appointment Related?  []  Yes  [x]  No     If yes;  Date:  Time:    Call warm transferred to PDL: [x]  Yes  []  No    Call Received by Practice Representative: rebecca

## 2019-07-08 NOTE — Telephone Encounter
Spoke with caller and advised that Dr Darla Lesches is not the patients pcp and these documents will need to go the them for signature.    Wells Guiles

## 2019-08-16 MED ORDER — MAGNESIUM OXIDE 400 (241.3 MG) MG PO TABS
ORAL_TABLET | 0 refills
Start: 2019-08-16 — End: ?

## 2019-08-16 MED ORDER — CLONIDINE HCL 0.1 MG PO TABS
ORAL_TABLET | 0 refills
Start: 2019-08-16 — End: ?

## 2019-08-17 MED ORDER — CLONIDINE HCL 0.1 MG PO TABS
ORAL_TABLET | 0 refills
Start: 2019-08-17 — End: ?

## 2019-08-18 MED ORDER — SODIUM BICARBONATE 650 MG PO TABS
ORAL_TABLET | 0 refills
Start: 2019-08-18 — End: ?

## 2019-08-20 MED ORDER — SODIUM BICARBONATE 650 MG PO TABS
ORAL_TABLET | 0 refills | 30.00000 days
Start: 2019-08-20 — End: ?

## 2019-10-05 ENCOUNTER — Ambulatory Visit: Payer: MEDICARE

## 2019-10-05 DIAGNOSIS — Z23 Encounter for immunization: Secondary | ICD-10-CM

## 2020-05-02 MED ORDER — METOPROLOL SUCCINATE ER 50 MG PO TB24
ORAL_TABLET | 0 refills | Status: AC
Start: 2020-05-02 — End: ?

## 2020-09-08 ENCOUNTER — Ambulatory Visit: Payer: PRIVATE HEALTH INSURANCE

## 2020-10-05 ENCOUNTER — Ambulatory Visit: Payer: MEDICARE

## 2020-12-07 ENCOUNTER — Ambulatory Visit: Payer: MEDICARE

## 2021-07-10 MED ORDER — METOPROLOL SUCCINATE ER 50 MG PO TB24
ORAL_TABLET | 0 refills
Start: 2021-07-10 — End: ?

## 2022-05-06 DEATH — deceased
